# Patient Record
Sex: Female | Born: 1935
Health system: Southern US, Community
[De-identification: ages and names within clinical notes are randomized; demographics above are authoritative.]

## PROBLEM LIST (undated history)

## (undated) DIAGNOSIS — I4892 Unspecified atrial flutter: Secondary | ICD-10-CM

## (undated) DIAGNOSIS — E119 Type 2 diabetes mellitus without complications: Secondary | ICD-10-CM

## (undated) DIAGNOSIS — N182 Chronic kidney disease, stage 2 (mild): Secondary | ICD-10-CM

## (undated) DIAGNOSIS — I10 Essential (primary) hypertension: Secondary | ICD-10-CM

## (undated) HISTORY — PX: EYE SURGERY: SHX253

## (undated) HISTORY — DX: Unspecified atrial flutter: I48.92

## (undated) HISTORY — DX: Essential (primary) hypertension: I10

## (undated) HISTORY — PX: APPENDECTOMY: SHX54

## (undated) HISTORY — DX: Type 2 diabetes mellitus without complications: E11.9

## (undated) HISTORY — PX: CORNEAL TRANSPLANT: SHX108

## (undated) HISTORY — PX: GALLBLADDER SURGERY: SHX652

## (undated) HISTORY — DX: Chronic kidney disease, stage 2 (mild): N18.2

---

## 2012-07-20 ENCOUNTER — Encounter: Payer: Self-pay | Admitting: *Deleted

## 2012-07-30 ENCOUNTER — Ambulatory Visit: Payer: Self-pay | Admitting: Cardiology

## 2012-08-14 ENCOUNTER — Other Ambulatory Visit (HOSPITAL_COMMUNITY): Payer: Self-pay | Admitting: Cardiovascular Disease

## 2012-08-14 DIAGNOSIS — I519 Heart disease, unspecified: Secondary | ICD-10-CM

## 2012-08-14 DIAGNOSIS — I4891 Unspecified atrial fibrillation: Secondary | ICD-10-CM

## 2012-08-14 DIAGNOSIS — R079 Chest pain, unspecified: Secondary | ICD-10-CM

## 2012-08-23 ENCOUNTER — Ambulatory Visit (HOSPITAL_COMMUNITY)
Admission: RE | Admit: 2012-08-23 | Discharge: 2012-08-23 | Disposition: A | Discharge status: Home / Self Care | Payer: Medicare Other | Source: Ambulatory Visit | Attending: Cardiovascular Disease | Admitting: Cardiovascular Disease

## 2012-08-23 DIAGNOSIS — I519 Heart disease, unspecified: Secondary | ICD-10-CM

## 2012-08-23 DIAGNOSIS — R079 Chest pain, unspecified: Secondary | ICD-10-CM

## 2012-08-23 DIAGNOSIS — I10 Essential (primary) hypertension: Secondary | ICD-10-CM | POA: Insufficient documentation

## 2012-08-23 DIAGNOSIS — I4891 Unspecified atrial fibrillation: Secondary | ICD-10-CM

## 2012-08-23 DIAGNOSIS — E119 Type 2 diabetes mellitus without complications: Secondary | ICD-10-CM | POA: Insufficient documentation

## 2012-08-23 DIAGNOSIS — R42 Dizziness and giddiness: Secondary | ICD-10-CM | POA: Insufficient documentation

## 2012-08-23 MED ORDER — REGADENOSON 0.4 MG/5ML IV SOLN
0.4000 mg | Freq: Once | INTRAVENOUS | Status: AC
  Administered 2012-08-23: 0.4 mg via INTRAVENOUS

## 2012-08-23 MED ORDER — TECHNETIUM TC 99M SESTAMIBI GENERIC - CARDIOLITE
10.0000 | Freq: Once | INTRAVENOUS | Status: AC | PRN
  Administered 2012-08-23: 10 via INTRAVENOUS

## 2012-08-23 MED ORDER — TECHNETIUM TC 99M SESTAMIBI GENERIC - CARDIOLITE
30.0000 | Freq: Once | INTRAVENOUS | Status: AC | PRN
  Administered 2012-08-23: 30 via INTRAVENOUS

## 2012-08-23 NOTE — Procedures (Addendum)
[place] [facility] [address] [place] [address] [phone]  Cardiology Nuclear Med Study  Audrey Walker is a 79 y.o. female     MRN : [id number]     DOB: 1933-03-25  Procedure Date: 08/23/2012  Nuclear Med Background Indication for Stress Test:  Evaluation for Ischemia History:  NO PRIOR CARDIAC HISTORY Cardiac Risk Factors: Hypertension, IDDM Type 2 and Obesity  Symptoms:  Chest Pain and Dizziness   Nuclear Pre-Procedure Caffeine/Decaff Intake:  10:00pm NPO After: 8:00am   IV Site: R Forearm  IV 0.9% NS with Angio Cath:  22g  Chest Size (in):  N/A IV Started by: Emmit Pomfret, RN  Height: 5' 6.5" (1.689 m)  Cup Size: B  BMI:  Body mass index is 33.07 kg/(m^2). Weight:  208 lb (94.348 kg)   Tech Comments:  N/A    Nuclear Med Study 1 or 2 day study: 1 day  Stress Test Type:  Lexiscan  Order Authorizing [place]:  Susa Griffins, MD   Resting Radionuclide: Technetium 59m Sestamibi  Resting Radionuclide Dose: 9.9 mCi   Stress Radionuclide:  Technetium 3m Sestamibi  Stress Radionuclide Dose: 30.9 mCi           Stress Protocol Rest HR: 109 Stress HR:116  Rest BP: 146/80 Stress BP: 148/70  Exercise Time (min): n/a METS: n/a          Dose of Adenosine (mg):  n/a Dose of Lexiscan: 0.4 mg  Dose of Atropine (mg): n/a Dose of Dobutamine: n/a mcg/kg/min (at max HR)  Stress Test Technologist: Ernestene Mention, CCT Nuclear Technologist: Koren Shiver, CNMT   Rest Procedure:  Myocardial perfusion imaging was performed at rest 45 minutes following the intravenous administration of Technetium 60m Sestamibi. Stress Procedure:  The patient received IV Lexiscan 0.4 mg over 15-seconds.  Technetium 78m Sestamibi injected at 30-seconds.  There were no significant changes with Lexiscan.  Quantitative spect images were obtained after a 45 minute delay.  Transient Ischemic Dilatation (Normal <1.22):  0.89 Lung/Heart Ratio (Normal  <0.45):  0.33 QGS EDV:  72 ml QGS ESV:  35 ml LV Ejection Fraction: 52%  Rest ECG: Sinus tachycardia with 1st degree AVB  Stress ECG: No significant change from baseline ECG  QPS Raw Data Images:  Normal; no motion artifact; normal heart/lung ratio. Stress Images:  Normal homogeneous uptake in all areas of the myocardium. Rest Images:  Small anteroseptal fixed defect, likely artifact Subtraction (SDS):  No evidence of ischemia.  Impression Exercise Capacity:  Lexiscan with no exercise. BP Response:  Normal blood pressure response. Clinical Symptoms:  No significant symptoms noted. ECG Impression:  No significant ECG changes with Lexiscan. Comparison with Prior Nuclear Study: No images to compare  Overall Impression:  Normal stress nuclear study.  LV Wall Motion:  NL LV Function; NL Wall Motion  Chrystie Nose, MD, [facility] Board Certified in Nuclear Cardiology Attending Cardiologist [facility]  Chrystie Nose, MD  08/23/2012 1:11 PM

## 2012-08-23 NOTE — Progress Notes (Signed)
2D Echo Performed 08/23/2012    Tammie Crouch, RCS  

## 2012-08-24 ENCOUNTER — Ambulatory Visit: Payer: Self-pay | Admitting: Internal Medicine

## 2012-09-13 ENCOUNTER — Ambulatory Visit: Payer: Self-pay | Admitting: Cardiology

## 2012-11-26 ENCOUNTER — Other Ambulatory Visit: Payer: Self-pay | Admitting: Cardiovascular Disease

## 2012-11-26 LAB — CBC WITH DIFFERENTIAL/PLATELET
Basophils Absolute: 0 10*3/uL (ref 0.0–0.1)
Basophils Relative: 0 % (ref 0–1)
Lymphocytes Relative: 18 % (ref 12–46)
MCHC: 33.8 g/dL (ref 30.0–36.0)
Monocytes Absolute: 0.6 10*3/uL (ref 0.1–1.0)
Neutro Abs: 5.7 10*3/uL (ref 1.7–7.7)
Neutrophils Relative %: 74 % (ref 43–77)
Platelets: 180 10*3/uL (ref 150–400)
RDW: 13.3 % (ref 11.5–15.5)
WBC: 7.7 10*3/uL (ref 4.0–10.5)

## 2012-11-26 LAB — COMPREHENSIVE METABOLIC PANEL
ALT: 15 U/L (ref 0–35)
AST: 30 U/L (ref 0–37)
Albumin: 4.2 g/dL (ref 3.5–5.2)
Alkaline Phosphatase: 61 U/L (ref 39–117)
BUN: 30 mg/dL — ABNORMAL HIGH (ref 6–23)
Chloride: 109 mEq/L (ref 96–112)
Potassium: 5 mEq/L (ref 3.5–5.3)
Sodium: 143 mEq/L (ref 135–145)

## 2013-01-15 ENCOUNTER — Other Ambulatory Visit: Payer: Self-pay | Admitting: Cardiovascular Disease

## 2013-01-15 LAB — COMPREHENSIVE METABOLIC PANEL
AST: 24 U/L (ref 0–37)
BUN: 36 mg/dL — ABNORMAL HIGH (ref 6–23)
Calcium: 9.8 mg/dL (ref 8.4–10.5)
Chloride: 110 mEq/L (ref 96–112)
Creat: 1.3 mg/dL — ABNORMAL HIGH (ref 0.50–1.10)
Total Bilirubin: 0.8 mg/dL (ref 0.3–1.2)

## 2013-01-17 ENCOUNTER — Encounter: Payer: Self-pay | Admitting: Cardiovascular Disease

## 2013-02-12 ENCOUNTER — Other Ambulatory Visit: Payer: Self-pay | Admitting: *Deleted

## 2013-02-12 DIAGNOSIS — I4892 Unspecified atrial flutter: Secondary | ICD-10-CM

## 2013-04-29 ENCOUNTER — Encounter: Payer: Self-pay | Admitting: *Deleted

## 2013-04-29 DIAGNOSIS — I4892 Unspecified atrial flutter: Secondary | ICD-10-CM

## 2013-04-29 DIAGNOSIS — I10 Essential (primary) hypertension: Secondary | ICD-10-CM | POA: Insufficient documentation

## 2013-04-29 DIAGNOSIS — E119 Type 2 diabetes mellitus without complications: Secondary | ICD-10-CM

## 2013-04-29 DIAGNOSIS — R030 Elevated blood-pressure reading, without diagnosis of hypertension: Secondary | ICD-10-CM

## 2013-04-30 ENCOUNTER — Ambulatory Visit (INDEPENDENT_AMBULATORY_CARE_PROVIDER_SITE_OTHER): Payer: Medicare Other | Admitting: Cardiology

## 2013-04-30 ENCOUNTER — Encounter: Payer: Self-pay | Admitting: Cardiology

## 2013-04-30 VITALS — BP 182/89 | HR 106 | Ht 66.0 in | Wt 222.0 lb

## 2013-04-30 DIAGNOSIS — I10 Essential (primary) hypertension: Secondary | ICD-10-CM

## 2013-04-30 DIAGNOSIS — E119 Type 2 diabetes mellitus without complications: Secondary | ICD-10-CM

## 2013-04-30 DIAGNOSIS — I4892 Unspecified atrial flutter: Secondary | ICD-10-CM

## 2013-04-30 MED ORDER — ATENOLOL 25 MG PO TABS: 12.5000 mg | ORAL_TABLET | Freq: Every day | ORAL | Status: DC

## 2013-04-30 NOTE — Assessment & Plan Note (Signed)
Keep followup with Dr. Hall. 

## 2013-04-30 NOTE — Patient Instructions (Signed)
  Your physician recommends that you schedule a follow-up appointment in: 2 months  Your physician has recommended you make the following change in your medication: STOP Amiodarone  START Aspirin 81 mg daily  Start Atenolol 25 mg - ( take 1/2 pill daily )

## 2013-04-30 NOTE — Assessment & Plan Note (Signed)
Chronic, not overly symptomatic based on discussion with the patient today. Recent tracings reviewed. Patient previously followed by Dr. Alanda Amass. Prior cardiac testing also reviewed. CHADSVASC score is 5, we discussed her increased risk of stroke, although she tells me that she is not comfortable taking an anticoagulant. She states she will take a baby aspirin daily. I have recommended that she stop amiodarone, and we will try atenolol 12.5 mg daily focusing on heart rate control. She is not interested in cardioversion. Followup arranged.

## 2013-04-30 NOTE — Progress Notes (Signed)
Clinical Summary Audrey Walker is a 79 y.o.female referred to establish cardiology care by Dr. Margo Aye. She is a former patient of Dr. Alanda Amass. Available records were reviewed. She has been managed for atrial flutter at least since January of this year, although duration is not entirely certain. Medical therapy has included diltiazem CD, Toprol-XL, Xarelto, most recently amiodarone. Patient reports multiple medication intolerances. Possibility of cardioversion seems to have been discussed, although was never pursued since she would not take an anticoagulant.  Recent lab work showed hemoglobin 13.4, platelets 173, potassium 4.6, BUN 30, creatinine 1.2 (creatinine clearance 40-50), AST 25, ALT 19, hemoglobin A1c 6.5%, cholesterol 171, triglycerides 65, HDL 71, LDL 87, TSH 0.8. Recent tracings from August and September revealed probable 2:1 atrial flutter versus atrial tachycardia with 2:1 block. This is noted on old tracings as well.  Echocardiogram from March of this year revealed mild LVH with LVEF 50-55%, grade 1 diastolic dysfunction, aortic valve sclerosis, mild mitral regurgitation, mild biatrial enlargement. Lexican Myoview from March of this year was negative for ischemia [facility]).  Audrey Walker states she feels better since she stopped Cardizem CD, Xarelto and Toprol-XL. We reviewed her thromboembolic risk, CHADSVASC score is  5. We discussed different options for anticoagulation. Patient was still not comfortable with an anticoagulant, did agree to take an aspirin daily. We also discussed other options for rate control rather than amiodarone to reduce potential long-term side effects. She stated that she did not want to consider a cardioversion.  Patient lives alone, states that her husband passed away last year, as did her son earlier this year. Has had a prolonged grieving process.  No Known Allergies  Current Outpatient Prescriptions  Medication Sig Dispense Refill  . acetaminophen  (TYLENOL) 500 MG tablet Take 500 mg by mouth every 6 (six) hours as needed.      . Ascorbic Acid (VITAMIN C) 1000 MG tablet Take 2,000 mg by mouth daily.      [name] aspirin EC 81 MG tablet Take 81 mg by mouth daily.      . Cyanocobalamin (VITAMIN B 12 PO) Take by mouth.      . enalapril (VASOTEC) 20 MG tablet Take 20 mg by mouth daily.      . fish oil-omega-3 fatty acids 1000 MG capsule Take 1 g by mouth daily.      . insulin glargine (LANTUS) 100 UNIT/ML injection Inject 40 Units into the skin at bedtime.      [name] atenolol (TENORMIN) 25 MG tablet Take 0.5 tablets (12.5 mg total) by mouth daily.  180 tablet  3   No current facility-administered medications for this visit.    Past Medical History  Diagnosis Date  . Type 2 diabetes mellitus   . Essential hypertension, benign   . Atrial flutter   . CKD (chronic kidney disease) stage 2, GFR 60-89 ml/min     History reviewed. No pertinent past surgical history.  Family History  Problem Relation Age of Onset  . Hypertension    . Diabetes Mellitus II      Social History Audrey Walker reports that she has never smoked. She does not have any smokeless tobacco history on file. Audrey Walker reports that she does not drink alcohol.  Review of Systems Does not complain of any palpitations. No chest pain or syncope. Appetite is fair. Not exercising regularly. Some trouble sleeping. No reported bleeding diathesis. No speech or focal motor deficits. Otherwise negative.  Physical Examination Filed Vitals:   04/30/13  0914  BP: 182/89  Pulse: 106   Filed Weights   04/30/13 0914  Weight: 222 lb (100.699 kg)   Overweight woman, comfortable at rest. HEENT: Conjunctiva and lids normal, oropharynx clear. Neck: Supple, no elevated JVP or carotid bruits, no thyromegaly. Lungs: Clear to auscultation, nonlabored breathing at rest. Cardiac: Regular rate and rhythm, no S3, soft systolic murmur, no pericardial rub. Abdomen: Soft, nontender, bowel sounds  present. Extremities: No pitting edema, distal pulses 2+. Skin: Warm and dry. Musculoskeletal: No kyphosis. Neuropsychiatric: Alert and oriented x3, affect grossly appropriate.   Problem List and Plan   Atrial flutter Chronic, not overly symptomatic based on discussion with the patient today. Recent tracings reviewed. Patient previously followed by Dr. Alanda Amass. Prior cardiac testing also reviewed. CHADSVASC score is 5, we discussed her increased risk of stroke, although she tells me that she is not comfortable taking an anticoagulant. She states she will take a baby aspirin daily. I have recommended that she stop amiodarone, and we will try atenolol 12.5 mg daily focusing on heart rate control. She is not interested in cardioversion. Followup arranged.  Essential hypertension, benign Blood pressure elevated today. Keep followup with Dr. Margo Aye. Continue on ACE inhibitor, low-dose beta blocker being added as well.  Type 2 diabetes mellitus Keep followup with Dr. Margo Aye.    Jonelle Sidle, M.D., F.A.C.C.

## 2013-04-30 NOTE — Assessment & Plan Note (Signed)
Blood pressure elevated today. Keep followup with Dr. Margo Aye. Continue on ACE inhibitor, low-dose beta blocker being added as well.

## 2013-06-19 ENCOUNTER — Ambulatory Visit: Payer: Medicare Other | Admitting: Cardiology

## 2013-06-25 ENCOUNTER — Encounter: Payer: Self-pay | Admitting: Cardiology

## 2013-06-25 ENCOUNTER — Ambulatory Visit (INDEPENDENT_AMBULATORY_CARE_PROVIDER_SITE_OTHER): Payer: Medicare Other | Admitting: Cardiology

## 2013-06-25 VITALS — BP 145/97 | HR 96 | Ht 66.5 in | Wt 225.0 lb

## 2013-06-25 DIAGNOSIS — I4892 Unspecified atrial flutter: Secondary | ICD-10-CM

## 2013-06-25 DIAGNOSIS — I10 Essential (primary) hypertension: Secondary | ICD-10-CM

## 2013-06-25 MED ORDER — ATENOLOL 25 MG PO TABS: 25.0000 mg | ORAL_TABLET | Freq: Every day | ORAL | Status: DC

## 2013-06-25 NOTE — Assessment & Plan Note (Signed)
As per discussions outlined, she plans to continue on aspirin rather than anticoagulant therapy, and we will use atenolol for rate control with no plans to cardiovert. Increase atenolol to 25 mg daily. Followup arranged.

## 2013-06-25 NOTE — Patient Instructions (Addendum)
Your physician recommends that you schedule a follow-up appointment in: 6 months   Increase Atenolol to 25 mg daily (take whole tablet)

## 2013-06-25 NOTE — Progress Notes (Signed)
    Clinical Summary Audrey Walker is a 80 y.o.female last seen in November 2014. Medication adjustments were made at that time. She states that she has tolerated atenolol, having some difficulty cutting the tablets.  Echocardiogram from March of this year revealed mild LVH with LVEF 67-67%, grade 1 diastolic dysfunction, aortic valve sclerosis, mild mitral regurgitation, mild biatrial enlargement. Lexican Myoview from March of this year was negative for ischemia [facility]).  We have discussed her increased thromboembolic risk, CHADSVASC score of 5, she continues to decline anticoagulation. She continues on aspirin. She is not interested in considering a cardioversion as we discussed previously.  She does report cold and flulike symptoms that began around Christmas time, has been slow to improve but is gradually feeling better. She never sought medical attention, has not seen Dr. [place] Crane.  No Known Allergies  Current Outpatient Prescriptions  Medication Sig Dispense Refill  . acetaminophen (TYLENOL) 500 MG tablet Take 500 mg by mouth every 6 (six) hours as needed.      . Ascorbic Acid (VITAMIN C) 1000 MG tablet Take 2,000 mg by mouth daily.      [name] aspirin EC 81 MG tablet Take 81 mg by mouth daily.      [name] atenolol (TENORMIN) 25 MG tablet Take 1 tablet (25 mg total) by mouth daily.  180 tablet  3  . Cyanocobalamin (VITAMIN B 12 PO) Take by mouth.      . enalapril (VASOTEC) 20 MG tablet Take 20 mg by mouth daily.      . fish oil-omega-3 fatty acids 1000 MG capsule Take 1 g by mouth daily.      . insulin glargine (LANTUS) 100 UNIT/ML injection Inject 40 Units into the skin at bedtime.       No current facility-administered medications for this visit.    Past Medical History  Diagnosis Date  . Type 2 diabetes mellitus   . Essential hypertension, benign   . Atrial flutter   . CKD (chronic kidney disease) stage 2, GFR 60-89 ml/min     Social History Audrey Walker reports that she has never smoked.  She does not have any smokeless tobacco history on file. Audrey Walker reports that she does not drink alcohol.  Review of Systems No exertional chest pain. Cough, nonproductive. No recent fevers or chills. Otherwise as outlined.  Physical Examination Filed Vitals:   06/25/13 1122  BP: 145/97  Pulse: 96   Filed Weights   06/25/13 1122  Weight: 225 lb (102.059 kg)    Overweight woman, comfortable at rest. Wearing a mask. HEENT: Conjunctiva and lids normal, oropharynx clear.  Neck: Supple, no elevated JVP or carotid bruits, no thyromegaly.  Lungs: Clear to auscultation, nonlabored breathing at rest.  Cardiac: Regular rate and rhythm, no S3, soft systolic murmur, no pericardial rub.  Abdomen: Soft, nontender, bowel sounds present.  Extremities: No pitting edema, distal pulses 2+.    Problem List and Plan   Atrial flutter As per discussions outlined, she plans to continue on aspirin rather than anticoagulant therapy, and we will use atenolol for rate control with no plans to cardiovert. Increase atenolol to 25 mg daily. Followup arranged.  Essential hypertension, benign Blood pressure elevated today. I recommended that she arrange a followup visit with Dr. [place] Crane - she states she has not seen him recently.    Satira Sark, M.D., F.A.C.C.

## 2013-06-25 NOTE — Assessment & Plan Note (Signed)
Blood pressure elevated today. I recommended that she arrange a followup visit with Dr. Nevada Crane - she states she has not seen him recently.

## 2013-08-19 ENCOUNTER — Encounter: Payer: Self-pay | Admitting: Cardiology

## 2013-11-14 ENCOUNTER — Ambulatory Visit (HOSPITAL_COMMUNITY)
Admission: RE | Admit: 2013-11-14 | Discharge: 2013-11-14 | Disposition: A | Discharge status: Home / Self Care | Payer: Medicare Other | Source: Ambulatory Visit | Attending: Internal Medicine | Admitting: Internal Medicine

## 2013-11-14 ENCOUNTER — Other Ambulatory Visit (HOSPITAL_COMMUNITY): Payer: Self-pay | Admitting: Internal Medicine

## 2013-11-14 DIAGNOSIS — M25562 Pain in left knee: Secondary | ICD-10-CM

## 2013-11-14 DIAGNOSIS — M25469 Effusion, unspecified knee: Secondary | ICD-10-CM | POA: Insufficient documentation

## 2013-11-14 DIAGNOSIS — M25569 Pain in unspecified knee: Secondary | ICD-10-CM | POA: Insufficient documentation

## 2014-02-05 ENCOUNTER — Ambulatory Visit (INDEPENDENT_AMBULATORY_CARE_PROVIDER_SITE_OTHER): Payer: Medicare Other | Admitting: Cardiology

## 2014-02-05 ENCOUNTER — Encounter: Payer: Self-pay | Admitting: Cardiology

## 2014-02-05 VITALS — BP 122/84 | HR 110 | Ht 66.0 in | Wt 227.0 lb

## 2014-02-05 DIAGNOSIS — I4892 Unspecified atrial flutter: Secondary | ICD-10-CM

## 2014-02-05 DIAGNOSIS — I10 Essential (primary) hypertension: Secondary | ICD-10-CM

## 2014-02-05 MED ORDER — ATENOLOL 25 MG PO TABS: 25.0000 mg | ORAL_TABLET | Freq: Every day | ORAL | Status: DC

## 2014-02-05 NOTE — Assessment & Plan Note (Signed)
Blood pressure today looks good.

## 2014-02-05 NOTE — Assessment & Plan Note (Signed)
Patient prefers a very conservative approach overall. She is on aspirin and low-dose atenolol. She declines anticoagulation and attempt at cardioversion or further EP evaluation. Keep followup with Dr. Nevada Crane, consider increasing atenolol to slow heart rate further if blood pressure tolerates, she did not want to make any changes today.

## 2014-02-05 NOTE — Progress Notes (Signed)
    Clinical Summary Audrey Walker is a 80 y.o.female last seen in January. She denies any significant sense of palpitations or progressive shortness of breath, remains functional in her ADLs. She did have a fall outside, lost her balance while she was cutting a rosebush, no dizziness or syncope.  Lab work from March of this year showed potassium 4.5, BUN 23, creatinine 1.0, normal LFTs, cholesterol 176, triglycerides 100, HDL 61, LDL 95.  ECG today shows atrial flutter with 2:1 block. We have discussed her increased thromboembolic risk, CHADSVASC score of 5, she continues to decline anticoagulation. She continues on aspirin. She is not interested in considering a cardioversion as we discussed previously.   No Known Allergies  Current Outpatient Prescriptions  Medication Sig Dispense Refill  . acetaminophen (TYLENOL) 500 MG tablet Take 500 mg by mouth every 6 (six) hours as needed.      . Ascorbic Acid (VITAMIN C) 1000 MG tablet Take 2,000 mg by mouth daily.      [name] aspirin EC 81 MG tablet Take 81 mg by mouth daily.      [name] atenolol (TENORMIN) 25 MG tablet Take 1 tablet (25 mg total) by mouth daily.  90 tablet  3  . Cyanocobalamin (VITAMIN B 12 PO) Take by mouth.      . enalapril (VASOTEC) 20 MG tablet Take 20 mg by mouth daily.      . fish oil-omega-3 fatty acids 1000 MG capsule Take 1 g by mouth daily.      . insulin glargine (LANTUS) 100 UNIT/ML injection Inject 40 Units into the skin at bedtime.       No current facility-administered medications for this visit.    Past Medical History  Diagnosis Date  . Type 2 diabetes mellitus   . Essential hypertension, benign   . Atrial flutter   . CKD (chronic kidney disease) stage 2, GFR 60-89 ml/min      Social History Audrey Walker reports that she has never smoked. She does not have any smokeless tobacco history on file. Audrey Walker reports that she does not drink alcohol.  Review of Systems Other systems reviewed and negative except as  outlined.  Physical Examination Filed Vitals:   02/05/14 1556  BP: 122/84  Pulse: 110   Filed Weights   02/05/14 1556  Weight: 227 lb (102.967 kg)    Overweight woman, comfortable at rest. HEENT: Conjunctiva and lids normal, oropharynx clear.  Neck: Supple, no elevated JVP or carotid bruits, no thyromegaly.  Lungs: Clear to auscultation, nonlabored breathing at rest.  Cardiac: Regular rate and rhythm, no S3, soft systolic murmur, no pericardial rub.  Abdomen: Soft, nontender, bowel sounds present.  Extremities: No pitting edema, distal pulses 2+.    Problem List and Plan   Atrial flutter Patient prefers a very conservative approach overall. She is on aspirin and low-dose atenolol. She declines anticoagulation and attempt at cardioversion or further EP evaluation. Keep followup with Dr. Nevada Crane, consider increasing atenolol to slow heart rate further if blood pressure tolerates, she did not want to make any changes today.  Essential hypertension, benign Blood pressure today looks good.    Satira Sark, M.D., F.A.C.C.

## 2014-02-05 NOTE — Patient Instructions (Signed)
Your physician wants you to follow-up in: 6 months with Dr. Domenic Polite. You will receive a reminder letter in the mail two months in advance. If you don't receive a letter, please call our office to schedule the follow-up appointment.  Your physician recommends that you continue on your current medications as directed. Please refer to the Current Medication list given to you today.  I have refilled your atenolol  Thank you for choosing [facility]!!

## 2014-08-11 ENCOUNTER — Encounter: Payer: Self-pay | Admitting: Cardiology

## 2014-08-11 ENCOUNTER — Ambulatory Visit (INDEPENDENT_AMBULATORY_CARE_PROVIDER_SITE_OTHER): Payer: Medicare HMO | Admitting: Cardiology

## 2014-08-11 VITALS — BP 126/62 | HR 61 | Ht 66.0 in | Wt 222.0 lb

## 2014-08-11 DIAGNOSIS — I483 Typical atrial flutter: Secondary | ICD-10-CM

## 2014-08-11 DIAGNOSIS — I10 Essential (primary) hypertension: Secondary | ICD-10-CM

## 2014-08-11 MED ORDER — ATENOLOL 50 MG PO TABS: 50.0000 mg | ORAL_TABLET | Freq: Every day | ORAL | Status: DC

## 2014-08-11 NOTE — Patient Instructions (Signed)
Your physician wants you to follow-up in: 6 months with Dr.McDowell You will receive a reminder letter in the mail two months in advance. If you don't receive a letter, please call our office to schedule the follow-up appointment.     Your physician recommends that you continue on your current medications as directed. Please refer to the Current Medication list given to you today.     Thank you for choosing [facility] !        

## 2014-08-11 NOTE — Progress Notes (Signed)
Cardiology Office Note  Date: 08/11/2014   ID: Audrey Walker, DOB 1932/07/28, MRN [id number]  PCP: Delphina Cahill, MD  Primary Cardiologist: Rozann Lesches, MD   Chief Complaint  Patient presents with  . Atrial Flutter  . Hypertension    History of Present Illness: Audrey Walker is a 81 y.o. female last seen in August 2015. She presents for a routine follow-up visit. She has had no progression in sense of palpitations, occasionally feels these at night time. No reported chest pain or syncope. She remains on aspirin and atenolol.  We have discussed her increased thromboembolic risk, CHADSVASC score of 5, and she continues to decline anticoagulation.  Primary care continues with Dr. Nevada Crane.  Past Medical History  Diagnosis Date  . Type 2 diabetes mellitus   . Essential hypertension, benign   . Atrial flutter   . CKD (chronic kidney disease) stage 2, GFR 60-89 ml/min     Current Outpatient Prescriptions  Medication Sig Dispense Refill  . acetaminophen (TYLENOL) 500 MG tablet Take 500 mg by mouth every 6 (six) hours as needed.    . Ascorbic Acid (VITAMIN C) 1000 MG tablet Take 2,000 mg by mouth daily.    [name] aspirin EC 81 MG tablet Take 81 mg by mouth daily.    [name] atenolol (TENORMIN) 50 MG tablet Take 1 tablet (50 mg total) by mouth daily. 90 tablet 3  . Cyanocobalamin (VITAMIN B 12 PO) Take by mouth.    . enalapril (VASOTEC) 20 MG tablet Take 20 mg by mouth daily.    . fish oil-omega-3 fatty acids 1000 MG capsule Take 1 g by mouth daily.    . insulin glargine (LANTUS) 100 UNIT/ML injection Inject 26 Units into the skin at bedtime.      No current facility-administered medications for this visit.    Allergies:  Nsaids   Social History: The patient  reports that she has never smoked. She does not have any smokeless tobacco history on file. She reports that she does not drink alcohol or use illicit drugs.   ROS:  Please see the history of present illness. Otherwise, complete  review of systems is positive for arthritic pains.  All other systems are reviewed and negative.    Physical Exam: VS:  BP 126/62 mmHg  Pulse 61  Ht 5\' 6"  (1.676 m)  Wt 222 lb (100.699 kg)  BMI 35.85 kg/m2  SpO2 95%, BMI Body mass index is 35.85 kg/(m^2).  Wt Readings from Last 3 Encounters:  08/11/14 222 lb (100.699 kg)  02/05/14 227 lb (102.967 kg)  06/25/13 225 lb (102.059 kg)     Overweight woman, comfortable at rest. HEENT: Conjunctiva and lids normal, oropharynx clear.  Neck: Supple, no elevated JVP or carotid bruits, no thyromegaly.  Lungs: Clear to auscultation, nonlabored breathing at rest.  Cardiac: Regular rate and rhythm, no S3, soft systolic murmur, no pericardial rub.  Abdomen: Soft, nontender, bowel sounds present.  Extremities: No pitting edema, distal pulses 2+.    ECG: ECG is not ordered today.  Recent Labwork:  Lab work from March 2015 showed potassium 4.5, BUN 23, creatinine 1.0, AST 27, ALT 22, cholesterol 176, triglycerides 100, HDL 61, LDL 95, hemoglobin A1c 7.0.  Other Studies Reviewed Today:  Echocardiogram 08/23/2012: Study Conclusions  - Left ventricle: The cavity size was normal. There was mild concentric hypertrophy. Systolic function was normal. The estimated ejection fraction was in the range of 50% to 55%. Wall motion was normal; there were no regional  wall motion abnormalities. Doppler parameters are consistent with abnormal left ventricular relaxation (grade 1 diastolic dysfunction). - Aortic valve: Sclerosis without stenosis. - Mitral valve: Mild regurgitation. - Left atrium: The atrium was mildly dilated. - Right atrium: The atrium was mildly dilated. - Atrial septum: No defect or patent foramen ovale was identified. Impressions:  - Relative taqchycardia. Mild reduction in EF. Doppler interrogation shows aatrial flutter pattern. No significant valvular disease.  ASSESSMENT AND PLAN:  1. Persistent  atrial flutter, mildly symptomatic. She continues on aspirin and atenolol, has declined anticoagulation and attempts at cardioversion. Continue observation.  2. Essential hypertension, blood pressure normal today.  Current medicines are reviewed at length with the patient today.  The patient does not have concerns regarding medicines.  Disposition: FU with me in 6 months.   Signed, Satira Sark, MD, [facility] 08/11/2014 10:13 AM    [place] [facility] [address], [place], [place] [address] Phone: [phone]; Fax: [phone]

## 2015-01-01 ENCOUNTER — Encounter: Payer: Self-pay | Admitting: *Deleted

## 2015-04-13 DIAGNOSIS — Z961 Presence of intraocular lens: Secondary | ICD-10-CM | POA: Diagnosis not present

## 2015-04-13 DIAGNOSIS — Z9841 Cataract extraction status, right eye: Secondary | ICD-10-CM | POA: Diagnosis not present

## 2015-04-13 DIAGNOSIS — E11319 Type 2 diabetes mellitus with unspecified diabetic retinopathy without macular edema: Secondary | ICD-10-CM | POA: Diagnosis not present

## 2015-04-13 DIAGNOSIS — H1851 Endothelial corneal dystrophy: Secondary | ICD-10-CM | POA: Diagnosis not present

## 2015-04-30 DIAGNOSIS — I10 Essential (primary) hypertension: Secondary | ICD-10-CM | POA: Diagnosis not present

## 2015-04-30 DIAGNOSIS — N182 Chronic kidney disease, stage 2 (mild): Secondary | ICD-10-CM | POA: Diagnosis not present

## 2015-04-30 DIAGNOSIS — E1122 Type 2 diabetes mellitus with diabetic chronic kidney disease: Secondary | ICD-10-CM | POA: Diagnosis not present

## 2015-05-05 DIAGNOSIS — N182 Chronic kidney disease, stage 2 (mild): Secondary | ICD-10-CM | POA: Diagnosis not present

## 2015-05-05 DIAGNOSIS — E1122 Type 2 diabetes mellitus with diabetic chronic kidney disease: Secondary | ICD-10-CM | POA: Diagnosis not present

## 2015-05-05 DIAGNOSIS — I482 Chronic atrial fibrillation: Secondary | ICD-10-CM | POA: Diagnosis not present

## 2015-05-05 DIAGNOSIS — M179 Osteoarthritis of knee, unspecified: Secondary | ICD-10-CM | POA: Diagnosis not present

## 2015-05-05 DIAGNOSIS — I10 Essential (primary) hypertension: Secondary | ICD-10-CM | POA: Diagnosis not present

## 2015-08-31 DIAGNOSIS — E1122 Type 2 diabetes mellitus with diabetic chronic kidney disease: Secondary | ICD-10-CM | POA: Diagnosis not present

## 2015-08-31 DIAGNOSIS — I10 Essential (primary) hypertension: Secondary | ICD-10-CM | POA: Diagnosis not present

## 2015-09-02 DIAGNOSIS — N182 Chronic kidney disease, stage 2 (mild): Secondary | ICD-10-CM | POA: Diagnosis not present

## 2015-09-02 DIAGNOSIS — K589 Irritable bowel syndrome without diarrhea: Secondary | ICD-10-CM | POA: Diagnosis not present

## 2015-09-02 DIAGNOSIS — E1122 Type 2 diabetes mellitus with diabetic chronic kidney disease: Secondary | ICD-10-CM | POA: Diagnosis not present

## 2015-09-02 DIAGNOSIS — I10 Essential (primary) hypertension: Secondary | ICD-10-CM | POA: Diagnosis not present

## 2015-09-02 DIAGNOSIS — I482 Chronic atrial fibrillation: Secondary | ICD-10-CM | POA: Diagnosis not present

## 2015-09-02 DIAGNOSIS — M179 Osteoarthritis of knee, unspecified: Secondary | ICD-10-CM | POA: Diagnosis not present

## 2015-09-09 ENCOUNTER — Ambulatory Visit (INDEPENDENT_AMBULATORY_CARE_PROVIDER_SITE_OTHER): Payer: Medicare HMO | Admitting: Cardiology

## 2015-09-09 ENCOUNTER — Encounter: Payer: Self-pay | Admitting: Cardiology

## 2015-09-09 VITALS — BP 122/68 | HR 70 | Ht 66.0 in | Wt 197.0 lb

## 2015-09-09 DIAGNOSIS — I4892 Unspecified atrial flutter: Secondary | ICD-10-CM

## 2015-09-09 DIAGNOSIS — I10 Essential (primary) hypertension: Secondary | ICD-10-CM | POA: Diagnosis not present

## 2015-09-09 NOTE — Progress Notes (Signed)
Cardiology Office Note  Date: 09/09/2015   ID: Audrey Walker, DOB Nov 02, 1932, MRN [id number]  PCP: Wende Neighbors, MD  Primary Cardiologist: Rozann Lesches, MD   Chief Complaint  Patient presents with  . Atrial Flutter    History of Present Illness: Audrey Walker is an 82 y.o. female last seen in February 2016. She presents for a routine follow-up visit. No complains of palpitations, unusual shortness of breath, or chest pain. She continues to follow with Dr. Nevada Crane for primary care.  CHADSVASC score is 5 - she has consistently declined anticoagulation. She does take aspirin, and also remains on atenolol. Her ECG today actually shows sinus rhythm with PACs.  Past Medical History  Diagnosis Date  . Type 2 diabetes mellitus ([place])   . Essential hypertension, benign   . Atrial flutter ([place])   . CKD (chronic kidney disease) stage 2, GFR 60-89 ml/min     Current Outpatient Prescriptions  Medication Sig Dispense Refill  . acetaminophen (TYLENOL) 500 MG tablet Take 500 mg by mouth every 6 (six) hours as needed.    . Ascorbic Acid (VITAMIN C) 1000 MG tablet Take 2,000 mg by mouth daily.    [name] aspirin EC 81 MG tablet Take 81 mg by mouth daily.    [name] atenolol (TENORMIN) 50 MG tablet Take 1 tablet (50 mg total) by mouth daily. 90 tablet 3  . Cyanocobalamin (VITAMIN B 12 PO) Take by mouth.    . enalapril (VASOTEC) 20 MG tablet Take 20 mg by mouth daily.    . fish oil-omega-3 fatty acids 1000 MG capsule Take 1 g by mouth daily.    . insulin glargine (LANTUS) 100 UNIT/ML injection Inject 26 Units into the skin at bedtime.      No current facility-administered medications for this visit.   Allergies:  Nsaids   Social History: The patient  reports that she has never smoked. She does not have any smokeless tobacco history on file. She reports that she does not drink alcohol or use illicit drugs.   ROS:  Please see the history of present illness. Otherwise, complete review of systems is positive  for arthritic symptoms.  All other systems are reviewed and negative.   Physical Exam: VS:  BP 122/68 mmHg  Pulse 70  Ht 5\' 6"  (1.676 m)  Wt 197 lb (89.359 kg)  BMI 31.81 kg/m2  SpO2 98%, BMI Body mass index is 31.81 kg/(m^2).  Wt Readings from Last 3 Encounters:  09/09/15 197 lb (89.359 kg)  08/11/14 222 lb (100.699 kg)  02/05/14 227 lb (102.967 kg)    General: Patient appears comfortable at rest. HEENT: Conjunctiva and lids normal, oropharynx clear with moist mucosa. Neck: Supple, no elevated JVP or carotid bruits, no thyromegaly. Lungs: Clear to auscultation, nonlabored breathing at rest. Cardiac: Regular rate and rhythm with ectopy, no S3 or significant systolic murmur, no pericardial rub. Abdomen: Soft, nontender, no hepatomegaly, bowel sounds present, no guarding or rebound. Extremities: No pitting edema, distal pulses 2+.  ECG: I personally reviewed the prior tracing from 02/05/2014 which showed atrial flutter with 2:1 block, low voltage and decreased R wave progression..  Recent Labwork:  June 2016: Potassium 4.6, BUN 29, creatinine 1.0, AST 28, ALT 23, hemoglobin 12.9, platelets 158, cholesterol 155, triglycerides 72, HDL 58, LDL 83, hemoglobin A1c 6.6  Other Studies Reviewed Today:  Echocardiogram 08/23/2012: Study Conclusions  - Left ventricle: The cavity size was normal. There was mild concentric hypertrophy. Systolic function was normal. The estimated ejection  fraction was in the range of 50% to 55%. Wall motion was normal; there were no regional wall motion abnormalities. Doppler parameters are consistent with abnormal left ventricular relaxation (grade 1 diastolic dysfunction). - Aortic valve: Sclerosis without stenosis. - Mitral valve: Mild regurgitation. - Left atrium: The atrium was mildly dilated. - Right atrium: The atrium was mildly dilated. - Atrial septum: No defect or patent foramen ovale was identified.  Lexiscan Myoview  08/23/2012: Impression Exercise Capacity: Lexiscan with no exercise. BP Response: Normal blood pressure response. Clinical Symptoms: No significant symptoms noted. ECG Impression: No significant ECG changes with Lexiscan. Comparison with Prior Nuclear Study: No images to compare  Overall Impression: Normal stress nuclear study.  LV Wall Motion: NL LV Function; NL Wall Motion  Assessment and Plan:  1. Paroxysmal atrial flutter, in sinus rhythm today with PACs by ECG. She is not bothered by any significant palpitations on atenolol. She has preferred to remain on aspirin rather than switching to DOAC, although CHADSVASC score is 5.  2. Essential hypertension, blood pressure control is good today. She is also on Vasotec.  Current medicines were reviewed with the patient today.   Orders Placed This Encounter  Procedures  . EKG 12-Lead    Disposition: FU with me in 1 year.   Signed, Satira Sark, MD, [facility] 09/09/2015 9:26 AM    [facility] [place]. [address] [place], [place] [address] Phone: [phone]; Fax: [phone]

## 2015-09-09 NOTE — Patient Instructions (Signed)

## 2015-09-24 DIAGNOSIS — H2510 Age-related nuclear cataract, unspecified eye: Secondary | ICD-10-CM | POA: Diagnosis not present

## 2015-09-24 DIAGNOSIS — E109 Type 1 diabetes mellitus without complications: Secondary | ICD-10-CM | POA: Diagnosis not present

## 2015-09-24 DIAGNOSIS — Z0100 Encounter for examination of eyes and vision without abnormal findings: Secondary | ICD-10-CM | POA: Diagnosis not present

## 2015-09-24 DIAGNOSIS — H5200 Hypermetropia, unspecified eye: Secondary | ICD-10-CM | POA: Diagnosis not present

## 2015-10-13 DIAGNOSIS — E1122 Type 2 diabetes mellitus with diabetic chronic kidney disease: Secondary | ICD-10-CM | POA: Diagnosis not present

## 2016-02-04 DIAGNOSIS — E782 Mixed hyperlipidemia: Secondary | ICD-10-CM | POA: Diagnosis not present

## 2016-02-04 DIAGNOSIS — E1122 Type 2 diabetes mellitus with diabetic chronic kidney disease: Secondary | ICD-10-CM | POA: Diagnosis not present

## 2016-02-09 DIAGNOSIS — E1122 Type 2 diabetes mellitus with diabetic chronic kidney disease: Secondary | ICD-10-CM | POA: Diagnosis not present

## 2016-02-09 DIAGNOSIS — N182 Chronic kidney disease, stage 2 (mild): Secondary | ICD-10-CM | POA: Diagnosis not present

## 2016-02-09 DIAGNOSIS — I10 Essential (primary) hypertension: Secondary | ICD-10-CM | POA: Diagnosis not present

## 2016-02-09 DIAGNOSIS — I482 Chronic atrial fibrillation: Secondary | ICD-10-CM | POA: Diagnosis not present

## 2016-02-09 DIAGNOSIS — M179 Osteoarthritis of knee, unspecified: Secondary | ICD-10-CM | POA: Diagnosis not present

## 2016-03-02 DIAGNOSIS — E1122 Type 2 diabetes mellitus with diabetic chronic kidney disease: Secondary | ICD-10-CM | POA: Diagnosis not present

## 2016-03-11 DIAGNOSIS — E1122 Type 2 diabetes mellitus with diabetic chronic kidney disease: Secondary | ICD-10-CM | POA: Diagnosis not present

## 2016-03-11 DIAGNOSIS — Z0000 Encounter for general adult medical examination without abnormal findings: Secondary | ICD-10-CM | POA: Diagnosis not present

## 2016-03-11 DIAGNOSIS — Z794 Long term (current) use of insulin: Secondary | ICD-10-CM | POA: Diagnosis not present

## 2016-03-11 DIAGNOSIS — I10 Essential (primary) hypertension: Secondary | ICD-10-CM | POA: Diagnosis not present

## 2016-03-11 DIAGNOSIS — I4892 Unspecified atrial flutter: Secondary | ICD-10-CM | POA: Diagnosis not present

## 2016-04-11 DIAGNOSIS — H1851 Endothelial corneal dystrophy: Secondary | ICD-10-CM | POA: Diagnosis not present

## 2016-06-21 DIAGNOSIS — E119 Type 2 diabetes mellitus without complications: Secondary | ICD-10-CM | POA: Diagnosis not present

## 2016-06-21 DIAGNOSIS — H1851 Endothelial corneal dystrophy: Secondary | ICD-10-CM | POA: Diagnosis not present

## 2016-06-21 DIAGNOSIS — Z794 Long term (current) use of insulin: Secondary | ICD-10-CM | POA: Diagnosis not present

## 2016-07-01 DIAGNOSIS — H18231 Secondary corneal edema, right eye: Secondary | ICD-10-CM | POA: Diagnosis not present

## 2016-07-27 DIAGNOSIS — D0512 Intraductal carcinoma in situ of left breast: Secondary | ICD-10-CM | POA: Diagnosis not present

## 2016-07-27 DIAGNOSIS — R69 Illness, unspecified: Secondary | ICD-10-CM | POA: Diagnosis not present

## 2016-07-27 DIAGNOSIS — Z006 Encounter for examination for normal comparison and control in clinical research program: Secondary | ICD-10-CM | POA: Diagnosis not present

## 2016-07-28 DIAGNOSIS — E1122 Type 2 diabetes mellitus with diabetic chronic kidney disease: Secondary | ICD-10-CM | POA: Diagnosis not present

## 2016-09-01 DIAGNOSIS — E1122 Type 2 diabetes mellitus with diabetic chronic kidney disease: Secondary | ICD-10-CM | POA: Diagnosis not present

## 2016-09-01 DIAGNOSIS — I10 Essential (primary) hypertension: Secondary | ICD-10-CM | POA: Diagnosis not present

## 2016-09-05 DIAGNOSIS — N182 Chronic kidney disease, stage 2 (mild): Secondary | ICD-10-CM | POA: Diagnosis not present

## 2016-09-05 DIAGNOSIS — Z6831 Body mass index (BMI) 31.0-31.9, adult: Secondary | ICD-10-CM | POA: Diagnosis not present

## 2016-09-05 DIAGNOSIS — I482 Chronic atrial fibrillation: Secondary | ICD-10-CM | POA: Diagnosis not present

## 2016-09-05 DIAGNOSIS — R69 Illness, unspecified: Secondary | ICD-10-CM | POA: Diagnosis not present

## 2016-09-05 DIAGNOSIS — I10 Essential (primary) hypertension: Secondary | ICD-10-CM | POA: Diagnosis not present

## 2016-09-05 DIAGNOSIS — M25569 Pain in unspecified knee: Secondary | ICD-10-CM | POA: Diagnosis not present

## 2016-09-05 DIAGNOSIS — E1122 Type 2 diabetes mellitus with diabetic chronic kidney disease: Secondary | ICD-10-CM | POA: Diagnosis not present

## 2016-09-05 DIAGNOSIS — K589 Irritable bowel syndrome without diarrhea: Secondary | ICD-10-CM | POA: Diagnosis not present

## 2016-09-06 NOTE — Progress Notes (Signed)
Cardiology Office Note  Date: 09/08/2016   ID: Audrey Walker, DOB 27-Nov-1932, MRN [id number]  PCP: Wende Neighbors, MD  Primary Cardiologist: Rozann Lesches, MD   Chief Complaint  Patient presents with  . History of atrial flutter    History of Present Illness: Audrey Walker is an 83 y.o. female last seen in March 2017. She presents for a routine follow-up visit. Reports no significant palpitations since last encounter. She continues to follow regularly with Dr. Nevada Crane.  CHADSVASC score is 5 - she has consistently declined anticoagulation. Current cardiac regimen includes aspirin, Vasotec, and Tenormin.  I personally reviewed her ECG today which shows normal sinus rhythm with low voltage.  Past Medical History:  Diagnosis Date  . Atrial flutter ([place])   . CKD (chronic kidney disease) stage 2, GFR 60-89 ml/min   . Essential hypertension, benign   . Type 2 diabetes mellitus ([place])     History reviewed. No pertinent surgical history.  Current Outpatient Prescriptions  Medication Sig Dispense Refill  . acetaminophen (TYLENOL) 500 MG tablet Take 500 mg by mouth every 6 (six) hours as needed.    . Ascorbic Acid (VITAMIN C) 1000 MG tablet Take 2,000 mg by mouth daily.    [name] aspirin EC 81 MG tablet Take 81 mg by mouth daily.    [name] atenolol (TENORMIN) 50 MG tablet Take 1 tablet (50 mg total) by mouth daily. 90 tablet 3  . Cyanocobalamin (VITAMIN B 12 PO) Take by mouth.    . enalapril (VASOTEC) 20 MG tablet Take 20 mg by mouth daily.    . fish oil-omega-3 fatty acids 1000 MG capsule Take 1 g by mouth daily.    . insulin glargine (LANTUS) 100 UNIT/ML injection Inject 20 Units into the skin at bedtime.      No current facility-administered medications for this visit.    Allergies:  Nsaids   Social History: The patient  reports that she has never smoked. She has never used smokeless tobacco. She reports that she does not drink alcohol or use drugs.   ROS:  Please see the history of present  illness. Otherwise, complete review of systems is positive for recent eye surgery.  All other systems are reviewed and negative.   Physical Exam: VS:  BP 132/68   Pulse 75   Wt 189 lb (85.7 kg)   SpO2 98%   BMI 30.51 kg/m , BMI Body mass index is 30.51 kg/m.  Wt Readings from Last 3 Encounters:  09/08/16 189 lb (85.7 kg)  09/09/15 197 lb (89.4 kg)  08/11/14 222 lb (100.7 kg)    General: Patient appears comfortable at rest. HEENT: Conjunctiva and lids normal, oropharynx clear with moist mucosa. Neck: Supple, no elevated JVP or carotid bruits, no thyromegaly. Lungs: Clear to auscultation, nonlabored breathing at rest. Cardiac: Regular rate and rhythm, no S3 or significant systolic murmur, no pericardial rub. Abdomen: Soft, nontender, bowel sounds present, no guarding or rebound. Extremities: No pitting edema, distal pulses 2+.  ECG: I personally reviewed the tracing from 09/09/2015 which showed sinus rhythm with PACs and low voltage.  Recent Labwork:  June 2016: Potassium 4.6, BUN 29, creatinine 1.0, AST 28, ALT 23, hemoglobin 12.9, platelets 158, cholesterol 155, triglycerides 72, HDL 58, LDL 83, hemoglobin A1c 6.6  Other Studies Reviewed Today:  Echocardiogram 08/23/2012: Study Conclusions  - Left ventricle: The cavity size was normal. There was mild concentric hypertrophy. Systolic function was normal. The estimated ejection fraction was in the range of 50%  to 55%. Wall motion was normal; there were no regional wall motion abnormalities. Doppler parameters are consistent with abnormal left ventricular relaxation (grade 1 diastolic dysfunction). - Aortic valve: Sclerosis without stenosis. - Mitral valve: Mild regurgitation. - Left atrium: The atrium was mildly dilated. - Right atrium: The atrium was mildly dilated. - Atrial septum: No defect or patent foramen ovale was identified.  Lexiscan Myoview 08/23/2012: Impression Exercise Capacity: Lexiscan with  no exercise. BP Response: Normal blood pressure response. Clinical Symptoms: No significant symptoms noted. ECG Impression: No significant ECG changes with Lexiscan. Comparison with Prior Nuclear Study: No images to compare  Overall Impression: Normal stress nuclear study.  LV Wall Motion: NL LV Function; NL Wall Motion  Assessment and Plan:  1. History of paroxysmal atrial flutter, no obvious recurrences over the last year. We have discussed anticoagulation in light of CHADSVASC score of 5, although she does not want to pursue this. She remains on aspirin and atenolol.  2. Essential hypertension, blood pressure adequately controlled on Vasotec.  Current medicines were reviewed with the patient today.   Orders Placed This Encounter  Procedures  . EKG 12-Lead    Disposition: Follow-up in one year, sooner in needed.  Signed, Satira Sark, MD, [facility] 09/08/2016 9:13 AM    [facility] [place]. [address] [place], [place] [address] Phone: [phone]; Fax: [phone]

## 2016-09-08 ENCOUNTER — Encounter: Payer: Self-pay | Admitting: Cardiology

## 2016-09-08 ENCOUNTER — Ambulatory Visit (INDEPENDENT_AMBULATORY_CARE_PROVIDER_SITE_OTHER): Payer: Medicare HMO | Admitting: Cardiology

## 2016-09-08 VITALS — BP 132/68 | HR 75 | Wt 189.0 lb

## 2016-09-08 DIAGNOSIS — I10 Essential (primary) hypertension: Secondary | ICD-10-CM | POA: Diagnosis not present

## 2016-09-08 DIAGNOSIS — I4892 Unspecified atrial flutter: Secondary | ICD-10-CM | POA: Diagnosis not present

## 2016-09-08 NOTE — Patient Instructions (Signed)
Your physician wants you to follow-up in: 1 year You will receive a reminder letter in the mail two months in advance. If you don't receive a letter, please call our office to schedule the follow-up appointment.   Your physician recommends that you continue on your current medications as directed. Please refer to the Current Medication list given to you today.    If you need a refill on your cardiac medications before your next appointment, please call your pharmacy.      Thank you for choosing [facility] !        

## 2016-09-14 DIAGNOSIS — C4441 Basal cell carcinoma of skin of scalp and neck: Secondary | ICD-10-CM | POA: Diagnosis not present

## 2016-09-14 DIAGNOSIS — C44619 Basal cell carcinoma of skin of left upper limb, including shoulder: Secondary | ICD-10-CM | POA: Diagnosis not present

## 2016-10-26 DIAGNOSIS — Z85828 Personal history of other malignant neoplasm of skin: Secondary | ICD-10-CM | POA: Diagnosis not present

## 2016-10-26 DIAGNOSIS — Z08 Encounter for follow-up examination after completed treatment for malignant neoplasm: Secondary | ICD-10-CM | POA: Diagnosis not present

## 2016-11-02 DIAGNOSIS — C4441 Basal cell carcinoma of skin of scalp and neck: Secondary | ICD-10-CM | POA: Diagnosis not present

## 2016-11-21 DIAGNOSIS — E1122 Type 2 diabetes mellitus with diabetic chronic kidney disease: Secondary | ICD-10-CM | POA: Diagnosis not present

## 2017-01-05 DIAGNOSIS — E113513 Type 2 diabetes mellitus with proliferative diabetic retinopathy with macular edema, bilateral: Secondary | ICD-10-CM | POA: Diagnosis not present

## 2017-01-25 DIAGNOSIS — H7091 Unspecified mastoiditis, right ear: Secondary | ICD-10-CM | POA: Diagnosis not present

## 2017-01-25 DIAGNOSIS — H9201 Otalgia, right ear: Secondary | ICD-10-CM | POA: Diagnosis not present

## 2017-01-25 DIAGNOSIS — Z6831 Body mass index (BMI) 31.0-31.9, adult: Secondary | ICD-10-CM | POA: Diagnosis not present

## 2017-01-25 DIAGNOSIS — L989 Disorder of the skin and subcutaneous tissue, unspecified: Secondary | ICD-10-CM | POA: Diagnosis not present

## 2017-02-14 DIAGNOSIS — H9209 Otalgia, unspecified ear: Secondary | ICD-10-CM | POA: Diagnosis not present

## 2017-02-14 DIAGNOSIS — H903 Sensorineural hearing loss, bilateral: Secondary | ICD-10-CM | POA: Diagnosis not present

## 2017-03-01 DIAGNOSIS — Z48817 Encounter for surgical aftercare following surgery on the skin and subcutaneous tissue: Secondary | ICD-10-CM | POA: Diagnosis not present

## 2017-03-01 DIAGNOSIS — Z85828 Personal history of other malignant neoplasm of skin: Secondary | ICD-10-CM | POA: Diagnosis not present

## 2017-03-06 DIAGNOSIS — E1122 Type 2 diabetes mellitus with diabetic chronic kidney disease: Secondary | ICD-10-CM | POA: Diagnosis not present

## 2017-03-06 DIAGNOSIS — I10 Essential (primary) hypertension: Secondary | ICD-10-CM | POA: Diagnosis not present

## 2017-03-08 DIAGNOSIS — E1122 Type 2 diabetes mellitus with diabetic chronic kidney disease: Secondary | ICD-10-CM | POA: Diagnosis not present

## 2017-03-08 DIAGNOSIS — L989 Disorder of the skin and subcutaneous tissue, unspecified: Secondary | ICD-10-CM | POA: Diagnosis not present

## 2017-03-08 DIAGNOSIS — N182 Chronic kidney disease, stage 2 (mild): Secondary | ICD-10-CM | POA: Diagnosis not present

## 2017-03-08 DIAGNOSIS — K589 Irritable bowel syndrome without diarrhea: Secondary | ICD-10-CM | POA: Diagnosis not present

## 2017-03-08 DIAGNOSIS — M179 Osteoarthritis of knee, unspecified: Secondary | ICD-10-CM | POA: Diagnosis not present

## 2017-03-08 DIAGNOSIS — I10 Essential (primary) hypertension: Secondary | ICD-10-CM | POA: Diagnosis not present

## 2017-03-08 DIAGNOSIS — I482 Chronic atrial fibrillation: Secondary | ICD-10-CM | POA: Diagnosis not present

## 2017-03-08 DIAGNOSIS — Z961 Presence of intraocular lens: Secondary | ICD-10-CM | POA: Diagnosis not present

## 2017-03-08 DIAGNOSIS — R69 Illness, unspecified: Secondary | ICD-10-CM | POA: Diagnosis not present

## 2017-03-08 DIAGNOSIS — Z6831 Body mass index (BMI) 31.0-31.9, adult: Secondary | ICD-10-CM | POA: Diagnosis not present

## 2017-03-21 DIAGNOSIS — E1122 Type 2 diabetes mellitus with diabetic chronic kidney disease: Secondary | ICD-10-CM | POA: Diagnosis not present

## 2017-07-13 DIAGNOSIS — E113513 Type 2 diabetes mellitus with proliferative diabetic retinopathy with macular edema, bilateral: Secondary | ICD-10-CM | POA: Diagnosis not present

## 2017-07-13 DIAGNOSIS — H35373 Puckering of macula, bilateral: Secondary | ICD-10-CM | POA: Diagnosis not present

## 2017-08-15 DIAGNOSIS — I10 Essential (primary) hypertension: Secondary | ICD-10-CM | POA: Diagnosis not present

## 2017-08-15 DIAGNOSIS — E1122 Type 2 diabetes mellitus with diabetic chronic kidney disease: Secondary | ICD-10-CM | POA: Diagnosis not present

## 2017-08-17 DIAGNOSIS — I10 Essential (primary) hypertension: Secondary | ICD-10-CM | POA: Diagnosis not present

## 2017-08-17 DIAGNOSIS — E1122 Type 2 diabetes mellitus with diabetic chronic kidney disease: Secondary | ICD-10-CM | POA: Diagnosis not present

## 2017-08-17 DIAGNOSIS — Z6830 Body mass index (BMI) 30.0-30.9, adult: Secondary | ICD-10-CM | POA: Diagnosis not present

## 2017-08-17 DIAGNOSIS — R202 Paresthesia of skin: Secondary | ICD-10-CM | POA: Diagnosis not present

## 2017-08-17 DIAGNOSIS — N182 Chronic kidney disease, stage 2 (mild): Secondary | ICD-10-CM | POA: Diagnosis not present

## 2017-08-23 DIAGNOSIS — E1122 Type 2 diabetes mellitus with diabetic chronic kidney disease: Secondary | ICD-10-CM | POA: Diagnosis not present

## 2017-10-10 NOTE — Progress Notes (Signed)
Cardiology Office Note  Date: 10/11/2017   ID: Jemina Scahill, DOB 05-04-1933, MRN [id number]  PCP: Celene Squibb, MD  Primary Cardiologist: Rozann Lesches, MD   Chief Complaint  Patient presents with  . Atrial Flutter    History of Present Illness: Evany Schecter is an 84 y.o. female last seen in March 2018.  She is here for a routine visit.  She does not report any palpitations or chest pain with activity.  No syncope.  She continues to follow with Dr. Nevada Crane for routine care.  I reviewed her medications which are listed below.  She states that her antihypertensives have been increased by Dr. Nevada Crane.  Blood pressure and heart rate are well controlled today.  I personally reviewed her ECG today which shows a sinus rhythm with poor R wave progression.  She has had no obvious interval atrial flutter and still prefers aspirin to anticoagulation.  Past Medical History:  Diagnosis Date  . Atrial flutter ([place])   . CKD (chronic kidney disease) stage 2, GFR 60-89 ml/min   . Essential hypertension, benign   . Type 2 diabetes mellitus ([place])     History reviewed. No pertinent surgical history.  Current Outpatient Medications  Medication Sig Dispense Refill  . acetaminophen (TYLENOL) 500 MG tablet Take 500 mg by mouth every 6 (six) hours as needed.    . Ascorbic Acid (VITAMIN C) 1000 MG tablet Take 2,000 mg by mouth daily.    [name] aspirin EC 81 MG tablet Take 81 mg by mouth daily.    [name] atenolol (TENORMIN) 50 MG tablet Take 1 tablet (50 mg total) by mouth daily. 90 tablet 3  . Cyanocobalamin (VITAMIN B 12 PO) Take by mouth.    . enalapril (VASOTEC) 20 MG tablet Take 20 mg by mouth daily.    . fish oil-omega-3 fatty acids 1000 MG capsule Take 1 g by mouth daily.    . insulin glargine (LANTUS) 100 UNIT/ML injection Inject 20 Units into the skin at bedtime.      No current facility-administered medications for this visit.    Allergies:  Nsaids   Social History: The patient  reports that she  has never smoked. She has never used smokeless tobacco. She reports that she does not drink alcohol or use drugs.   ROS:  Please see the history of present illness. Otherwise, complete review of systems is positive for intermittent left median nerve neuropathic symptoms.  All other systems are reviewed and negative.   Physical Exam: VS:  BP 122/68   Pulse 68   Ht 5' 6.5" (1.689 m)   Wt 187 lb 9.6 oz (85.1 kg)   SpO2 96%   BMI 29.83 kg/m , BMI Body mass index is 29.83 kg/m.  Wt Readings from Last 3 Encounters:  10/11/17 187 lb 9.6 oz (85.1 kg)  09/08/16 189 lb (85.7 kg)  09/09/15 197 lb (89.4 kg)    General: Elderly woman, appears comfortable at rest. HEENT: Conjunctiva and lids normal, oropharynx clear. Neck: Supple, no elevated JVP or carotid bruits, no thyromegaly. Lungs: Clear to auscultation, nonlabored breathing at rest. Cardiac: Regular rate and rhythm, no S3 or significant systolic murmur, no pericardial rub. Abdomen: Soft, nontender, bowel sounds present. Extremities: No pitting edema, distal pulses 2+.  ECG: I personally reviewed the tracing from 09/08/2016 which showed normal sinus rhythm with low voltage.  Recent Labwork:  June 2016: Potassium 4.6, BUN 29, creatinine 1.0, AST 28, ALT 23, hemoglobin 12.9, platelets 158, cholesterol 155,  triglycerides 72, HDL 58, LDL 83, hemoglobin A1c 6.6  Other Studies Reviewed Today:  Echocardiogram 08/23/2012: Study Conclusions  - Left ventricle: The cavity size was normal. There was mild concentric hypertrophy. Systolic function was normal. The estimated ejection fraction was in the range of 50% to 55%. Wall motion was normal; there were no regional wall motion abnormalities. Doppler parameters are consistent with abnormal left ventricular relaxation (grade 1 diastolic dysfunction). - Aortic valve: Sclerosis without stenosis. - Mitral valve: Mild regurgitation. - Left atrium: The atrium was mildly dilated. -  Right atrium: The atrium was mildly dilated. - Atrial septum: No defect or patent foramen ovale was identified.  Lexiscan Myoview 08/23/2012: Impression Exercise Capacity: Lexiscan with no exercise. BP Response: Normal blood pressure response. Clinical Symptoms: No significant symptoms noted. ECG Impression: No significant ECG changes with Lexiscan. Comparison with Prior Nuclear Study: No images to compare  Overall Impression: Normal stress nuclear study.  LV Wall Motion: NL LV Function; NL Wall Motion  Assessment and Plan:  1.  Paroxysmal atrial flutter.  She reports no palpitations and has had no obvious breakthrough events. ECG shows sinus rhythm today.  She prefers aspirin to anticoagulation although we have discussed stroke risk with CHADSVASC score of 5.  2.  Essential hypertension, continues on enalapril with follow-up per Dr. Nevada Crane.  Current medicines were reviewed with the patient today.   Orders Placed This Encounter  Procedures  . EKG 12-Lead    Disposition: Follow-up in 1 year.  Signed, Satira Sark, MD, [facility] 10/11/2017 9:43 AM    [facility] at [place]. [address] [place],  [address] Phone: [phone]; Fax: [phone]

## 2017-10-11 ENCOUNTER — Ambulatory Visit: Payer: Medicare HMO | Admitting: Cardiology

## 2017-10-11 ENCOUNTER — Encounter: Payer: Self-pay | Admitting: Cardiology

## 2017-10-11 VITALS — BP 122/68 | HR 68 | Ht 66.5 in | Wt 187.6 lb

## 2017-10-11 DIAGNOSIS — I10 Essential (primary) hypertension: Secondary | ICD-10-CM | POA: Diagnosis not present

## 2017-10-11 DIAGNOSIS — I4892 Unspecified atrial flutter: Secondary | ICD-10-CM

## 2017-10-11 NOTE — Patient Instructions (Signed)

## 2017-10-31 DIAGNOSIS — Z85828 Personal history of other malignant neoplasm of skin: Secondary | ICD-10-CM | POA: Diagnosis not present

## 2017-10-31 DIAGNOSIS — Z08 Encounter for follow-up examination after completed treatment for malignant neoplasm: Secondary | ICD-10-CM | POA: Diagnosis not present

## 2017-10-31 DIAGNOSIS — C44612 Basal cell carcinoma of skin of right upper limb, including shoulder: Secondary | ICD-10-CM | POA: Diagnosis not present

## 2017-10-31 DIAGNOSIS — L820 Inflamed seborrheic keratosis: Secondary | ICD-10-CM | POA: Diagnosis not present

## 2017-11-21 DIAGNOSIS — C44612 Basal cell carcinoma of skin of right upper limb, including shoulder: Secondary | ICD-10-CM | POA: Diagnosis not present

## 2017-11-29 DIAGNOSIS — H5022 Vertical strabismus, left eye: Secondary | ICD-10-CM | POA: Diagnosis not present

## 2017-11-29 DIAGNOSIS — E109 Type 1 diabetes mellitus without complications: Secondary | ICD-10-CM | POA: Diagnosis not present

## 2017-11-29 DIAGNOSIS — E10319 Type 1 diabetes mellitus with unspecified diabetic retinopathy without macular edema: Secondary | ICD-10-CM | POA: Diagnosis not present

## 2018-01-02 DIAGNOSIS — E1122 Type 2 diabetes mellitus with diabetic chronic kidney disease: Secondary | ICD-10-CM | POA: Diagnosis not present

## 2018-01-04 DIAGNOSIS — H26491 Other secondary cataract, right eye: Secondary | ICD-10-CM | POA: Diagnosis not present

## 2018-02-20 DIAGNOSIS — N182 Chronic kidney disease, stage 2 (mild): Secondary | ICD-10-CM | POA: Diagnosis not present

## 2018-02-20 DIAGNOSIS — I482 Chronic atrial fibrillation: Secondary | ICD-10-CM | POA: Diagnosis not present

## 2018-02-20 DIAGNOSIS — K589 Irritable bowel syndrome without diarrhea: Secondary | ICD-10-CM | POA: Diagnosis not present

## 2018-02-20 DIAGNOSIS — Z6830 Body mass index (BMI) 30.0-30.9, adult: Secondary | ICD-10-CM | POA: Diagnosis not present

## 2018-02-20 DIAGNOSIS — E1122 Type 2 diabetes mellitus with diabetic chronic kidney disease: Secondary | ICD-10-CM | POA: Diagnosis not present

## 2018-02-20 DIAGNOSIS — I10 Essential (primary) hypertension: Secondary | ICD-10-CM | POA: Diagnosis not present

## 2018-02-20 DIAGNOSIS — M179 Osteoarthritis of knee, unspecified: Secondary | ICD-10-CM | POA: Diagnosis not present

## 2018-02-20 DIAGNOSIS — R202 Paresthesia of skin: Secondary | ICD-10-CM | POA: Diagnosis not present

## 2018-02-23 DIAGNOSIS — Z6829 Body mass index (BMI) 29.0-29.9, adult: Secondary | ICD-10-CM | POA: Diagnosis not present

## 2018-02-23 DIAGNOSIS — E870 Hyperosmolality and hypernatremia: Secondary | ICD-10-CM | POA: Diagnosis not present

## 2018-02-23 DIAGNOSIS — R208 Other disturbances of skin sensation: Secondary | ICD-10-CM | POA: Diagnosis not present

## 2018-02-23 DIAGNOSIS — I10 Essential (primary) hypertension: Secondary | ICD-10-CM | POA: Diagnosis not present

## 2018-02-23 DIAGNOSIS — E1122 Type 2 diabetes mellitus with diabetic chronic kidney disease: Secondary | ICD-10-CM | POA: Diagnosis not present

## 2018-02-23 DIAGNOSIS — N182 Chronic kidney disease, stage 2 (mild): Secondary | ICD-10-CM | POA: Diagnosis not present

## 2018-03-15 DIAGNOSIS — Z6830 Body mass index (BMI) 30.0-30.9, adult: Secondary | ICD-10-CM | POA: Diagnosis not present

## 2018-03-15 DIAGNOSIS — Z0000 Encounter for general adult medical examination without abnormal findings: Secondary | ICD-10-CM | POA: Diagnosis not present

## 2018-04-13 DIAGNOSIS — E1122 Type 2 diabetes mellitus with diabetic chronic kidney disease: Secondary | ICD-10-CM | POA: Diagnosis not present

## 2018-04-18 DIAGNOSIS — Z0100 Encounter for examination of eyes and vision without abnormal findings: Secondary | ICD-10-CM | POA: Diagnosis not present

## 2018-06-19 DIAGNOSIS — E119 Type 2 diabetes mellitus without complications: Secondary | ICD-10-CM | POA: Diagnosis not present

## 2018-06-19 DIAGNOSIS — I10 Essential (primary) hypertension: Secondary | ICD-10-CM | POA: Diagnosis not present

## 2018-07-31 DIAGNOSIS — I10 Essential (primary) hypertension: Secondary | ICD-10-CM | POA: Diagnosis not present

## 2018-07-31 DIAGNOSIS — E119 Type 2 diabetes mellitus without complications: Secondary | ICD-10-CM | POA: Diagnosis not present

## 2018-10-11 DIAGNOSIS — N182 Chronic kidney disease, stage 2 (mild): Secondary | ICD-10-CM | POA: Diagnosis not present

## 2018-10-11 DIAGNOSIS — I10 Essential (primary) hypertension: Secondary | ICD-10-CM | POA: Diagnosis not present

## 2018-10-11 DIAGNOSIS — Z0001 Encounter for general adult medical examination with abnormal findings: Secondary | ICD-10-CM | POA: Diagnosis not present

## 2018-10-11 DIAGNOSIS — E1122 Type 2 diabetes mellitus with diabetic chronic kidney disease: Secondary | ICD-10-CM | POA: Diagnosis not present

## 2018-10-17 ENCOUNTER — Telehealth: Payer: Self-pay | Admitting: Cardiology

## 2018-10-17 NOTE — Telephone Encounter (Signed)
Virtual Visit Pre-Appointment Phone Call  "(Name), I am calling you today to discuss your upcoming appointment. We are currently trying to limit exposure to the virus that causes COVID-19 by seeing patients at home rather than in the office."  1. "What is the BEST phone number to call the day of the visit?" - include this in appointment notes  2. Do you have or have access to (through a family member/friend) a smartphone with video capability that we can use for your visit?" a. If yes - list this number in appt notes as cell (if different from BEST phone #) and list the appointment type as a VIDEO visit in appointment notes b. If no - list the appointment type as a PHONE visit in appointment notes  3. Confirm consent - "In the setting of the current Covid19 crisis, you are scheduled for a (phone or video) visit with your provider on (date) at (time).  Just as we do with many in-office visits, in order for you to participate in this visit, we must obtain consent.  If you'd like, I can send this to your mychart (if signed up) or email for you to review.  Otherwise, I can obtain your verbal consent now.  All virtual visits are billed to your insurance company just like a normal visit would be.  By agreeing to a virtual visit, we'd like you to understand that the technology does not allow for your provider to perform an examination, and thus may limit your provider's ability to fully assess your condition. If your provider identifies any concerns that need to be evaluated in person, we will make arrangements to do so.  Finally, though the technology is pretty good, we cannot assure that it will always work on either your or our end, and in the setting of a video visit, we may have to convert it to a phone-only visit.  In either situation, we cannot ensure that we have a secure connection.  Are you willing to proceed?" STAFF: Did the patient verbally acknowledge consent to telehealth visit? Document  YES/NO here:Yes  4. Advise patient to be prepared - "Two hours prior to your appointment, go ahead and check your blood pressure, pulse, oxygen saturation, and your weight (if you have the equipment to check those) and write them all down. When your visit starts, your provider will ask you for this information. If you have an Apple Watch or Kardia device, please plan to have heart rate information ready on the day of your appointment. Please have a pen and paper handy nearby the day of the visit as well."  5. Give patient instructions for MyChart download to smartphone OR [email or web address] as below if video visit (depending on what platform provider is using)  6. Inform patient they will receive a phone call 15 minutes prior to their appointment time (may be from unknown caller ID) so they should be prepared to answer    TELEPHONE CALL NOTE  Audrey Walker has been deemed a candidate for a follow-up tele-health visit to limit community exposure during the Covid-19 pandemic. I spoke with the patient via phone to ensure availability of phone/video source, confirm preferred email & phone number, and discuss instructions and expectations.  I reminded Audrey Walker to be prepared with any vital sign and/or heart rhythm information that could potentially be obtained via home monitoring, at the time of her visit. I reminded Audrey Walker to expect a phone call prior to her visit.  Audrey Mark  L Walker 10/17/2018 2:24 PM

## 2018-10-17 NOTE — Progress Notes (Signed)
Virtual Visit via Telephone Note   This visit type was conducted due to national recommendations for restrictions regarding the COVID-19 Pandemic (e.g. social distancing) in an effort to limit this patient's exposure and mitigate transmission in our community.  Due to her co-morbid illnesses, this patient is at least at moderate risk for complications without adequate follow up.  This format is felt to be most appropriate for this patient at this time.  The patient did not have access to video technology/had technical difficulties with video requiring transitioning to audio format only (telephone).  All issues noted in this document were discussed and addressed.  No physical exam could be performed with this format.  Please refer to the patient's chart for her  consent to telehealth for [facility].   Date:  10/18/2018   ID:  Audrey Walker, DOB [birth year], MRN [id number]  Patient Location: Home Provider Location: Office  PCP:  Celene Squibb, MD  Cardiologist:  Rozann Lesches, MD  Evaluation Performed:  Follow-Up Visit  Chief Complaint:   Cardiac follow-up  History of Present Illness:    Audrey Walker is an 85 y.o. female last seen in May 2019.  She did not have video access today and we spoke by phone.  She tells me that she has been doing well over the last year.  Specifically, no palpitations, dizziness, speech changes or focal motor weakness.  No exertional chest pain.  No syncope.  CHADSVASC score is 5, she has consistently declined anticoagulation so far.  She remains on aspirin.  I reviewed her medications which are listed below.  She reports no intolerances.  She states that she has been social distancing.  Her daughter goes out and gets her groceries for her.  The patient does not have symptoms concerning for COVID-19 infection (fever, chills, cough, or new shortness of breath).    Past Medical History:  Diagnosis Date  . Atrial flutter ([place])   . CKD (chronic kidney  disease) stage 2, GFR 60-89 ml/min   . Essential hypertension, benign   . Type 2 diabetes mellitus (HCC)    No past surgical history on file.   Current Meds  Medication Sig  . acetaminophen (TYLENOL) 500 MG tablet Take 500 mg by mouth every 6 (six) hours as needed.  . Ascorbic Acid (VITAMIN C) 1000 MG tablet Take 2,000 mg by mouth daily.  [name] aspirin EC 81 MG tablet Take 81 mg by mouth daily as needed.   [name] atenolol (TENORMIN) 50 MG tablet Take 1 tablet (50 mg total) by mouth daily.  . Cyanocobalamin (VITAMIN B 12 PO) Take 1 tablet by mouth daily.   . enalapril (VASOTEC) 20 MG tablet Take 20 mg by mouth daily.  . fish oil-omega-3 fatty acids 1000 MG capsule Take 1 g by mouth daily.  . insulin glargine (LANTUS) 100 UNIT/ML injection Inject 20 Units into the skin at bedtime.      Allergies:   Nsaids   Social History   Tobacco Use  . Smoking status: Never Smoker  . Smokeless tobacco: Never Used  Substance Use Topics  . Alcohol use: No  . Drug use: No     Family Hx: The patient's family history includes Diabetes Mellitus II in her unknown relative; Hypertension in her unknown relative.  ROS:   Please see the history of present illness.    All other systems reviewed and are negative.   Prior CV studies:   The following studies were reviewed today:  Echocardiogram 08/23/2012:  Study Conclusions  - Left ventricle: The cavity size was normal. There was mild concentric hypertrophy. Systolic function was normal. The estimated ejection fraction was in the range of 50% to 55%. Wall motion was normal; there were no regional wall motion abnormalities. Doppler parameters are consistent with abnormal left ventricular relaxation (grade 1 diastolic dysfunction). - Aortic valve: Sclerosis without stenosis. - Mitral valve: Mild regurgitation. - Left atrium: The atrium was mildly dilated. - Right atrium: The atrium was mildly dilated. - Atrial septum: No defect or patent  foramen ovale was identified.  Lexiscan Myoview 08/23/2012: Impression Exercise Capacity: Lexiscan with no exercise. BP Response: Normal blood pressure response. Clinical Symptoms: No significant symptoms noted. ECG Impression: No significant ECG changes with Lexiscan. Comparison with Prior Nuclear Study: No images to compare  Overall Impression: Normal stress nuclear study.  LV Wall Motion: NL LV Function; NL Wall Motion  Labs/Other Tests and Data Reviewed:    EKG:  An ECG dated 10/11/2017 was personally reviewed today and demonstrated:  Sinus rhythm with poor R wave progression.  Recent Labs:  June 2016: Potassium 4.6, BUN 29, creatinine 1.0, AST 28, ALT 23, hemoglobin 12.9, platelets 158, cholesterol 155, triglycerides 72, HDL 58, LDL 83, hemoglobin A1c 6.6  Wt Readings from Last 3 Encounters:  10/18/18 189 lb (85.7 kg)  10/11/17 187 lb 9.6 oz (85.1 kg)  09/08/16 189 lb (85.7 kg)     Objective:    Vital Signs:  Ht 5' 6.5" (1.689 m)   Wt 189 lb (85.7 kg)   BMI 30.05 kg/m    She was not able to check her vital signs today. She was not breathless while speaking in full sentences on the phone.  Voice tone and speech pattern were normal.  No audible wheezing.  ASSESSMENT & PLAN:    1.  Paroxysmal atrial flutter.  She does not report any interval palpitations on atenolol.  She continues to decline anticoagulation, CHADSVASC score is 5.  Continue aspirin.  2.  Essential hypertension.  No changes made to current regimen.  She was not able to check her blood pressure today.  Keep follow-up with Dr. Nevada Crane.  COVID-19 Education: The signs and symptoms of COVID-19 were discussed with the patient and how to seek care for testing (follow up with PCP or arrange E-visit).  The importance of social distancing was discussed today.  Time:   Today, I have spent 6 minutes with the patient with telehealth technology discussing the above problems.     Medication Adjustments/Labs  and Tests Ordered: Current medicines are reviewed at length with the patient today.  Concerns regarding medicines are outlined above.   Tests Ordered: No orders of the defined types were placed in this encounter.   Medication Changes: No orders of the defined types were placed in this encounter.   Disposition:  Follow up 1 year in the office.  Signed, Rozann Lesches, MD  10/18/2018 2:43 PM    [place]

## 2018-10-18 ENCOUNTER — Encounter: Payer: Self-pay | Admitting: *Deleted

## 2018-10-18 ENCOUNTER — Encounter: Payer: Self-pay | Admitting: Cardiology

## 2018-10-18 ENCOUNTER — Telehealth (INDEPENDENT_AMBULATORY_CARE_PROVIDER_SITE_OTHER): Payer: Medicare HMO | Admitting: Cardiology

## 2018-10-18 VITALS — Ht 66.5 in | Wt 189.0 lb

## 2018-10-18 DIAGNOSIS — Z7189 Other specified counseling: Secondary | ICD-10-CM | POA: Diagnosis not present

## 2018-10-18 DIAGNOSIS — I4892 Unspecified atrial flutter: Secondary | ICD-10-CM

## 2018-10-18 DIAGNOSIS — I10 Essential (primary) hypertension: Secondary | ICD-10-CM | POA: Diagnosis not present

## 2018-10-18 NOTE — Patient Instructions (Addendum)

## 2018-10-23 ENCOUNTER — Telehealth: Payer: Medicare HMO | Admitting: Cardiology

## 2018-10-26 DIAGNOSIS — R69 Illness, unspecified: Secondary | ICD-10-CM | POA: Diagnosis not present

## 2018-11-20 DIAGNOSIS — N182 Chronic kidney disease, stage 2 (mild): Secondary | ICD-10-CM | POA: Diagnosis not present

## 2018-11-20 DIAGNOSIS — E1122 Type 2 diabetes mellitus with diabetic chronic kidney disease: Secondary | ICD-10-CM | POA: Diagnosis not present

## 2018-11-20 DIAGNOSIS — I10 Essential (primary) hypertension: Secondary | ICD-10-CM | POA: Diagnosis not present

## 2018-11-27 DIAGNOSIS — Z6830 Body mass index (BMI) 30.0-30.9, adult: Secondary | ICD-10-CM | POA: Diagnosis not present

## 2018-11-27 DIAGNOSIS — N183 Chronic kidney disease, stage 3 (moderate): Secondary | ICD-10-CM | POA: Diagnosis not present

## 2018-11-27 DIAGNOSIS — E669 Obesity, unspecified: Secondary | ICD-10-CM | POA: Diagnosis not present

## 2018-11-27 DIAGNOSIS — E1122 Type 2 diabetes mellitus with diabetic chronic kidney disease: Secondary | ICD-10-CM | POA: Diagnosis not present

## 2018-11-27 DIAGNOSIS — I129 Hypertensive chronic kidney disease with stage 1 through stage 4 chronic kidney disease, or unspecified chronic kidney disease: Secondary | ICD-10-CM | POA: Diagnosis not present

## 2018-11-28 DIAGNOSIS — E083293 Diabetes mellitus due to underlying condition with mild nonproliferative diabetic retinopathy without macular edema, bilateral: Secondary | ICD-10-CM | POA: Diagnosis not present

## 2019-01-16 DIAGNOSIS — R69 Illness, unspecified: Secondary | ICD-10-CM | POA: Diagnosis not present

## 2019-02-27 DIAGNOSIS — N183 Chronic kidney disease, stage 3 (moderate): Secondary | ICD-10-CM | POA: Diagnosis not present

## 2019-02-27 DIAGNOSIS — N182 Chronic kidney disease, stage 2 (mild): Secondary | ICD-10-CM | POA: Diagnosis not present

## 2019-02-27 DIAGNOSIS — E119 Type 2 diabetes mellitus without complications: Secondary | ICD-10-CM | POA: Diagnosis not present

## 2019-02-27 DIAGNOSIS — I10 Essential (primary) hypertension: Secondary | ICD-10-CM | POA: Diagnosis not present

## 2019-03-28 DIAGNOSIS — E119 Type 2 diabetes mellitus without complications: Secondary | ICD-10-CM | POA: Diagnosis not present

## 2019-03-28 DIAGNOSIS — N182 Chronic kidney disease, stage 2 (mild): Secondary | ICD-10-CM | POA: Diagnosis not present

## 2019-03-28 DIAGNOSIS — I10 Essential (primary) hypertension: Secondary | ICD-10-CM | POA: Diagnosis not present

## 2019-04-09 DIAGNOSIS — R69 Illness, unspecified: Secondary | ICD-10-CM | POA: Diagnosis not present

## 2019-05-13 DIAGNOSIS — I10 Essential (primary) hypertension: Secondary | ICD-10-CM | POA: Diagnosis not present

## 2019-05-13 DIAGNOSIS — E119 Type 2 diabetes mellitus without complications: Secondary | ICD-10-CM | POA: Diagnosis not present

## 2019-05-22 DIAGNOSIS — I10 Essential (primary) hypertension: Secondary | ICD-10-CM | POA: Diagnosis not present

## 2019-05-22 DIAGNOSIS — E1122 Type 2 diabetes mellitus with diabetic chronic kidney disease: Secondary | ICD-10-CM | POA: Diagnosis not present

## 2019-05-22 DIAGNOSIS — E669 Obesity, unspecified: Secondary | ICD-10-CM | POA: Diagnosis not present

## 2019-05-22 DIAGNOSIS — N183 Chronic kidney disease, stage 3 unspecified: Secondary | ICD-10-CM | POA: Diagnosis not present

## 2019-05-29 DIAGNOSIS — M79642 Pain in left hand: Secondary | ICD-10-CM | POA: Diagnosis not present

## 2019-05-29 DIAGNOSIS — I129 Hypertensive chronic kidney disease with stage 1 through stage 4 chronic kidney disease, or unspecified chronic kidney disease: Secondary | ICD-10-CM | POA: Diagnosis not present

## 2019-05-29 DIAGNOSIS — E1122 Type 2 diabetes mellitus with diabetic chronic kidney disease: Secondary | ICD-10-CM | POA: Diagnosis not present

## 2019-06-25 DIAGNOSIS — E119 Type 2 diabetes mellitus without complications: Secondary | ICD-10-CM | POA: Diagnosis not present

## 2019-06-25 DIAGNOSIS — I10 Essential (primary) hypertension: Secondary | ICD-10-CM | POA: Diagnosis not present

## 2019-07-02 DIAGNOSIS — R69 Illness, unspecified: Secondary | ICD-10-CM | POA: Diagnosis not present

## 2019-08-05 DIAGNOSIS — H5200 Hypermetropia, unspecified eye: Secondary | ICD-10-CM | POA: Diagnosis not present

## 2019-08-05 DIAGNOSIS — Z0100 Encounter for examination of eyes and vision without abnormal findings: Secondary | ICD-10-CM | POA: Diagnosis not present

## 2019-08-13 DIAGNOSIS — M79642 Pain in left hand: Secondary | ICD-10-CM | POA: Diagnosis not present

## 2019-08-13 DIAGNOSIS — I129 Hypertensive chronic kidney disease with stage 1 through stage 4 chronic kidney disease, or unspecified chronic kidney disease: Secondary | ICD-10-CM | POA: Diagnosis not present

## 2019-08-13 DIAGNOSIS — E1122 Type 2 diabetes mellitus with diabetic chronic kidney disease: Secondary | ICD-10-CM | POA: Diagnosis not present

## 2019-08-23 DIAGNOSIS — E7849 Other hyperlipidemia: Secondary | ICD-10-CM | POA: Diagnosis not present

## 2019-08-23 DIAGNOSIS — I129 Hypertensive chronic kidney disease with stage 1 through stage 4 chronic kidney disease, or unspecified chronic kidney disease: Secondary | ICD-10-CM | POA: Diagnosis not present

## 2019-08-23 DIAGNOSIS — E1122 Type 2 diabetes mellitus with diabetic chronic kidney disease: Secondary | ICD-10-CM | POA: Diagnosis not present

## 2019-08-23 DIAGNOSIS — N183 Chronic kidney disease, stage 3 unspecified: Secondary | ICD-10-CM | POA: Diagnosis not present

## 2019-10-11 ENCOUNTER — Other Ambulatory Visit: Payer: Self-pay

## 2019-10-11 ENCOUNTER — Encounter: Payer: Self-pay | Admitting: Cardiology

## 2019-10-11 ENCOUNTER — Ambulatory Visit: Payer: Medicare HMO | Admitting: Cardiology

## 2019-10-11 VITALS — BP 130/62 | HR 70 | Temp 98.2°F | Ht 66.0 in | Wt 183.4 lb

## 2019-10-11 DIAGNOSIS — I10 Essential (primary) hypertension: Secondary | ICD-10-CM

## 2019-10-11 DIAGNOSIS — I4892 Unspecified atrial flutter: Secondary | ICD-10-CM | POA: Diagnosis not present

## 2019-10-11 NOTE — Progress Notes (Signed)
Cardiology Office Note  Date: 10/11/2019   ID: Audrey Walker, DOB 1933/04/28, MRN [id number]  PCP:  Celene Squibb, MD  Cardiologist:  Rozann Lesches, MD Electrophysiologist:  None   Chief Complaint  Patient presents with  . Cardiac follow-up    History of Present Illness: Audrey Walker is an 86 y.o. female last assessed via telehealth encounter in May 2020.  She presents for a routine follow-up visit.  She states that she has done well in general, no active palpitations or increasing breathlessness with typical ADLs.  She does not report any falls.  Review her medications which are stable and outlined below.  She remains on atenolol and enalapril.  Blood pressure and heart rate are reasonable today.  CHA2DS2-VASc score is 5.  She declines anticoagulation.  She does take an aspirin daily.  I personally reviewed her ECG today which shows sinus rhythm with PACs and PVC, low voltage.  Past Medical History:  Diagnosis Date  . Atrial flutter ([place])   . CKD (chronic kidney disease) stage 2, GFR 60-89 ml/min   . Essential hypertension, benign   . Type 2 diabetes mellitus ([place])     History reviewed. No pertinent surgical history.  Current Outpatient Medications  Medication Sig Dispense Refill  . acetaminophen (TYLENOL) 500 MG tablet Take 500 mg by mouth every 6 (six) hours as needed.    . Ascorbic Acid (VITAMIN C) 1000 MG tablet Take 2,000 mg by mouth daily.    [name] aspirin EC 81 MG tablet Take 81 mg by mouth daily as needed.     [name] atenolol (TENORMIN) 50 MG tablet Take 1 tablet (50 mg total) by mouth daily. 90 tablet 3  . B-D ULTRAFINE III SHORT PEN 31G X 8 MM MISC Inject 1 Syringe into the skin daily.    . Blood Glucose Monitoring Suppl (ONE TOUCH ULTRA 2) w/Device KIT     . Cyanocobalamin (VITAMIN B 12 PO) Take 1 tablet by mouth daily.     . enalapril (VASOTEC) 20 MG tablet Take 20 mg by mouth daily.    . fish oil-omega-3 fatty acids 1000 MG capsule Take 1 g by mouth daily.    .  insulin glargine (LANTUS) 100 UNIT/ML injection Inject 16 Units into the skin at bedtime.      No current facility-administered medications for this visit.   Allergies:  Nsaids   ROS:  Unsteady gait.  Physical Exam: VS:  BP 130/62   Pulse 70   Temp 98.2 F (36.8 C)   Ht '5\' 6"'$  (1.676 m)   Wt 183 lb 6.4 oz (83.2 kg)   SpO2 96%   BMI 29.60 kg/m , BMI Body mass index is 29.6 kg/m.  Wt Readings from Last 3 Encounters:  10/11/19 183 lb 6.4 oz (83.2 kg)  10/18/18 189 lb (85.7 kg)  10/11/17 187 lb 9.6 oz (85.1 kg)    General: Elderly woman, appears comfortable at rest. HEENT: Conjunctiva and lids normal, wearing a mask. Neck: Supple, no elevated JVP or carotid bruits, no thyromegaly. Lungs: Clear to auscultation, nonlabored breathing at rest. Cardiac: RRR with ectopy, no S3 or significant systolic murmur. Abdomen: Soft, bowel sounds present. Extremities: No pitting edema, distal pulses 2+.  ECG:  An ECG dated 10/11/2017 was personally reviewed today and demonstrated:  Sinus rhythm with poor R wave progression.  Recent Labwork:  No interval lab work for review today.  Other Studies Reviewed Today:  Echocardiogram 08/23/2012: Study Conclusions  - Left ventricle: The  cavity size was normal. There was mild concentric hypertrophy. Systolic function was normal. The estimated ejection fraction was in the range of 50% to 55%. Wall motion was normal; there were no regional wall motion abnormalities. Doppler parameters are consistent with abnormal left ventricular relaxation (grade 1 diastolic dysfunction). - Aortic valve: Sclerosis without stenosis. - Mitral valve: Mild regurgitation. - Left atrium: The atrium was mildly dilated. - Right atrium: The atrium was mildly dilated. - Atrial septum: No defect or patent foramen ovale was identified.  Lexiscan Myoview 08/23/2012: Impression Exercise Capacity: Lexiscan with no exercise. BP Response: Normal blood pressure  response. Clinical Symptoms: No significant symptoms noted. ECG Impression: No significant ECG changes with Lexiscan. Comparison with Prior Nuclear Study: No images to compare  Overall Impression: Normal stress nuclear study.  LV Wall Motion: NL LV Function; NL Wall Motion  Assessment and Plan:  1.  Paroxysmal atrial flutter.  CHA2DS2-VASc score is 5, she declines anticoagulation but continues on low-dose aspirin.  Continue atenolol, no active palpitations.  She is in sinus rhythm today with PACs.  2.  Essential hypertension, systolic is 175.  Continue enalapril with follow-up by Dr. Nevada Crane.  Medication Adjustments/Labs and Tests Ordered: Current medicines are reviewed at length with the patient today.  Concerns regarding medicines are outlined above.   Tests Ordered: Orders Placed This Encounter  Procedures  . EKG 12-Lead    Medication Changes: No orders of the defined types were placed in this encounter.   Disposition:  Follow up 1 year in the [place] office.  Signed, Satira Sark, MD, [facility] 10/11/2019 11:44 AM    [place] [facility] [place]. [address] [place], [place] [address] Phone: [phone]; Fax: [phone]

## 2019-10-11 NOTE — Patient Instructions (Signed)
Medication Instructions: Your physician recommends that you continue on your current medications as directed. Please refer to the Current Medication list given to you today.   Labwork: none today  Procedures/Testing: None today  Follow-Up: 1 year office visit with Dr.McDowell  Any Additional Special Instructions Will Be Listed Below (If Applicable).     If you need a refill on your cardiac medications before your next appointment, please call your pharmacy.     Thank you for choosing [facility] !

## 2019-10-22 DIAGNOSIS — E119 Type 2 diabetes mellitus without complications: Secondary | ICD-10-CM | POA: Diagnosis not present

## 2019-10-22 DIAGNOSIS — K589 Irritable bowel syndrome without diarrhea: Secondary | ICD-10-CM | POA: Diagnosis not present

## 2019-10-22 DIAGNOSIS — I129 Hypertensive chronic kidney disease with stage 1 through stage 4 chronic kidney disease, or unspecified chronic kidney disease: Secondary | ICD-10-CM | POA: Diagnosis not present

## 2019-10-22 DIAGNOSIS — E669 Obesity, unspecified: Secondary | ICD-10-CM | POA: Diagnosis not present

## 2019-10-22 DIAGNOSIS — M179 Osteoarthritis of knee, unspecified: Secondary | ICD-10-CM | POA: Diagnosis not present

## 2019-10-22 DIAGNOSIS — E1122 Type 2 diabetes mellitus with diabetic chronic kidney disease: Secondary | ICD-10-CM | POA: Diagnosis not present

## 2019-10-22 DIAGNOSIS — M79642 Pain in left hand: Secondary | ICD-10-CM | POA: Diagnosis not present

## 2019-10-22 DIAGNOSIS — E870 Hyperosmolality and hypernatremia: Secondary | ICD-10-CM | POA: Diagnosis not present

## 2019-10-22 DIAGNOSIS — E7849 Other hyperlipidemia: Secondary | ICD-10-CM | POA: Diagnosis not present

## 2019-10-22 DIAGNOSIS — I10 Essential (primary) hypertension: Secondary | ICD-10-CM | POA: Diagnosis not present

## 2019-10-25 DIAGNOSIS — E1122 Type 2 diabetes mellitus with diabetic chronic kidney disease: Secondary | ICD-10-CM | POA: Diagnosis not present

## 2019-10-25 DIAGNOSIS — N1831 Chronic kidney disease, stage 3a: Secondary | ICD-10-CM | POA: Diagnosis not present

## 2019-10-25 DIAGNOSIS — I129 Hypertensive chronic kidney disease with stage 1 through stage 4 chronic kidney disease, or unspecified chronic kidney disease: Secondary | ICD-10-CM | POA: Diagnosis not present

## 2019-12-20 DIAGNOSIS — E1122 Type 2 diabetes mellitus with diabetic chronic kidney disease: Secondary | ICD-10-CM | POA: Diagnosis not present

## 2020-01-21 DIAGNOSIS — E1122 Type 2 diabetes mellitus with diabetic chronic kidney disease: Secondary | ICD-10-CM | POA: Diagnosis not present

## 2020-01-21 DIAGNOSIS — N183 Chronic kidney disease, stage 3 unspecified: Secondary | ICD-10-CM | POA: Diagnosis not present

## 2020-01-21 DIAGNOSIS — E7849 Other hyperlipidemia: Secondary | ICD-10-CM | POA: Diagnosis not present

## 2020-01-21 DIAGNOSIS — I129 Hypertensive chronic kidney disease with stage 1 through stage 4 chronic kidney disease, or unspecified chronic kidney disease: Secondary | ICD-10-CM | POA: Diagnosis not present

## 2020-02-25 DIAGNOSIS — I129 Hypertensive chronic kidney disease with stage 1 through stage 4 chronic kidney disease, or unspecified chronic kidney disease: Secondary | ICD-10-CM | POA: Diagnosis not present

## 2020-02-25 DIAGNOSIS — N183 Chronic kidney disease, stage 3 unspecified: Secondary | ICD-10-CM | POA: Diagnosis not present

## 2020-02-25 DIAGNOSIS — E1122 Type 2 diabetes mellitus with diabetic chronic kidney disease: Secondary | ICD-10-CM | POA: Diagnosis not present

## 2020-02-25 DIAGNOSIS — E7849 Other hyperlipidemia: Secondary | ICD-10-CM | POA: Diagnosis not present

## 2020-03-19 DIAGNOSIS — R202 Paresthesia of skin: Secondary | ICD-10-CM | POA: Diagnosis not present

## 2020-03-19 DIAGNOSIS — Z6830 Body mass index (BMI) 30.0-30.9, adult: Secondary | ICD-10-CM | POA: Diagnosis not present

## 2020-03-19 DIAGNOSIS — Z0001 Encounter for general adult medical examination with abnormal findings: Secondary | ICD-10-CM | POA: Diagnosis not present

## 2020-03-19 DIAGNOSIS — Z712 Person consulting for explanation of examination or test findings: Secondary | ICD-10-CM | POA: Diagnosis not present

## 2020-03-19 DIAGNOSIS — M179 Osteoarthritis of knee, unspecified: Secondary | ICD-10-CM | POA: Diagnosis not present

## 2020-03-19 DIAGNOSIS — E1122 Type 2 diabetes mellitus with diabetic chronic kidney disease: Secondary | ICD-10-CM | POA: Diagnosis not present

## 2020-03-19 DIAGNOSIS — N182 Chronic kidney disease, stage 2 (mild): Secondary | ICD-10-CM | POA: Diagnosis not present

## 2020-03-19 DIAGNOSIS — N1831 Chronic kidney disease, stage 3a: Secondary | ICD-10-CM | POA: Diagnosis not present

## 2020-03-19 DIAGNOSIS — K589 Irritable bowel syndrome without diarrhea: Secondary | ICD-10-CM | POA: Diagnosis not present

## 2020-03-19 DIAGNOSIS — I10 Essential (primary) hypertension: Secondary | ICD-10-CM | POA: Diagnosis not present

## 2020-04-08 DIAGNOSIS — E1122 Type 2 diabetes mellitus with diabetic chronic kidney disease: Secondary | ICD-10-CM | POA: Diagnosis not present

## 2020-04-08 DIAGNOSIS — E870 Hyperosmolality and hypernatremia: Secondary | ICD-10-CM | POA: Diagnosis not present

## 2020-04-08 DIAGNOSIS — M79642 Pain in left hand: Secondary | ICD-10-CM | POA: Diagnosis not present

## 2020-04-08 DIAGNOSIS — E785 Hyperlipidemia, unspecified: Secondary | ICD-10-CM | POA: Diagnosis not present

## 2020-04-08 DIAGNOSIS — I129 Hypertensive chronic kidney disease with stage 1 through stage 4 chronic kidney disease, or unspecified chronic kidney disease: Secondary | ICD-10-CM | POA: Diagnosis not present

## 2020-05-19 DIAGNOSIS — E1122 Type 2 diabetes mellitus with diabetic chronic kidney disease: Secondary | ICD-10-CM | POA: Diagnosis not present

## 2020-05-26 DIAGNOSIS — J069 Acute upper respiratory infection, unspecified: Secondary | ICD-10-CM | POA: Diagnosis not present

## 2020-06-01 ENCOUNTER — Inpatient Hospital Stay (HOSPITAL_COMMUNITY)
Admission: EM | Admit: 2020-06-01 | Discharge: 2020-06-03 | DRG: 178 | Disposition: A | Discharge status: Home Health | Payer: Medicare HMO | Attending: Internal Medicine | Admitting: Internal Medicine

## 2020-06-01 ENCOUNTER — Inpatient Hospital Stay (HOSPITAL_COMMUNITY): Payer: Medicare HMO

## 2020-06-01 ENCOUNTER — Encounter (HOSPITAL_COMMUNITY): Payer: Self-pay | Admitting: *Deleted

## 2020-06-01 DIAGNOSIS — E1169 Type 2 diabetes mellitus with other specified complication: Secondary | ICD-10-CM | POA: Diagnosis not present

## 2020-06-01 DIAGNOSIS — I129 Hypertensive chronic kidney disease with stage 1 through stage 4 chronic kidney disease, or unspecified chronic kidney disease: Secondary | ICD-10-CM | POA: Diagnosis present

## 2020-06-01 DIAGNOSIS — Z7982 Long term (current) use of aspirin: Secondary | ICD-10-CM

## 2020-06-01 DIAGNOSIS — I4892 Unspecified atrial flutter: Secondary | ICD-10-CM | POA: Diagnosis not present

## 2020-06-01 DIAGNOSIS — R531 Weakness: Secondary | ICD-10-CM | POA: Diagnosis not present

## 2020-06-01 DIAGNOSIS — E871 Hypo-osmolality and hyponatremia: Secondary | ICD-10-CM | POA: Diagnosis not present

## 2020-06-01 DIAGNOSIS — E1122 Type 2 diabetes mellitus with diabetic chronic kidney disease: Secondary | ICD-10-CM | POA: Diagnosis present

## 2020-06-01 DIAGNOSIS — J811 Chronic pulmonary edema: Secondary | ICD-10-CM | POA: Diagnosis not present

## 2020-06-01 DIAGNOSIS — D61818 Other pancytopenia: Secondary | ICD-10-CM | POA: Diagnosis not present

## 2020-06-01 DIAGNOSIS — E860 Dehydration: Secondary | ICD-10-CM | POA: Diagnosis not present

## 2020-06-01 DIAGNOSIS — Z794 Long term (current) use of insulin: Secondary | ICD-10-CM

## 2020-06-01 DIAGNOSIS — I10 Essential (primary) hypertension: Secondary | ICD-10-CM | POA: Diagnosis not present

## 2020-06-01 DIAGNOSIS — I4891 Unspecified atrial fibrillation: Secondary | ICD-10-CM | POA: Diagnosis present

## 2020-06-01 DIAGNOSIS — E861 Hypovolemia: Secondary | ICD-10-CM | POA: Diagnosis present

## 2020-06-01 DIAGNOSIS — N1831 Chronic kidney disease, stage 3a: Secondary | ICD-10-CM | POA: Diagnosis present

## 2020-06-01 DIAGNOSIS — Z79899 Other long term (current) drug therapy: Secondary | ICD-10-CM | POA: Diagnosis not present

## 2020-06-01 DIAGNOSIS — E119 Type 2 diabetes mellitus without complications: Secondary | ICD-10-CM

## 2020-06-01 DIAGNOSIS — U071 COVID-19: Secondary | ICD-10-CM | POA: Diagnosis not present

## 2020-06-01 LAB — URINALYSIS, ROUTINE W REFLEX MICROSCOPIC
Bilirubin Urine: NEGATIVE
Glucose, UA: NEGATIVE mg/dL
Ketones, ur: NEGATIVE mg/dL
Leukocytes,Ua: NEGATIVE
Nitrite: NEGATIVE
Protein, ur: NEGATIVE mg/dL
Specific Gravity, Urine: 1.004 — ABNORMAL LOW (ref 1.005–1.030)
pH: 5 (ref 5.0–8.0)

## 2020-06-01 LAB — RESP PANEL BY RT-PCR (FLU A&B, COVID) ARPGX2
Influenza A by PCR: NEGATIVE
Influenza B by PCR: NEGATIVE
SARS Coronavirus 2 by RT PCR: POSITIVE — AB

## 2020-06-01 LAB — CBC WITH DIFFERENTIAL/PLATELET
Abs Immature Granulocytes: 0.01 10*3/uL (ref 0.00–0.07)
Basophils Absolute: 0 10*3/uL (ref 0.0–0.1)
Basophils Relative: 0 %
Eosinophils Absolute: 0 10*3/uL (ref 0.0–0.5)
Eosinophils Relative: 0 %
HCT: 31.5 % — ABNORMAL LOW (ref 36.0–46.0)
Hemoglobin: 11.2 g/dL — ABNORMAL LOW (ref 12.0–15.0)
Immature Granulocytes: 0 %
Lymphocytes Relative: 10 %
Lymphs Abs: 0.4 10*3/uL — ABNORMAL LOW (ref 0.7–4.0)
MCH: 31.4 pg (ref 26.0–34.0)
MCHC: 35.6 g/dL (ref 30.0–36.0)
MCV: 88.2 fL (ref 80.0–100.0)
Monocytes Absolute: 0.3 10*3/uL (ref 0.1–1.0)
Monocytes Relative: 8 %
Neutro Abs: 3.1 10*3/uL (ref 1.7–7.7)
Neutrophils Relative %: 82 %
Platelets: 102 10*3/uL — ABNORMAL LOW (ref 150–400)
RBC: 3.57 MIL/uL — ABNORMAL LOW (ref 3.87–5.11)
RDW: 11.6 % (ref 11.5–15.5)
WBC: 3.8 10*3/uL — ABNORMAL LOW (ref 4.0–10.5)
nRBC: 0 % (ref 0.0–0.2)

## 2020-06-01 LAB — COMPREHENSIVE METABOLIC PANEL
ALT: 48 U/L — ABNORMAL HIGH (ref 0–44)
AST: 97 U/L — ABNORMAL HIGH (ref 15–41)
Albumin: 3.1 g/dL — ABNORMAL LOW (ref 3.5–5.0)
Alkaline Phosphatase: 51 U/L (ref 38–126)
Anion gap: 8 (ref 5–15)
BUN: 35 mg/dL — ABNORMAL HIGH (ref 8–23)
CO2: 20 mmol/L — ABNORMAL LOW (ref 22–32)
Calcium: 7.9 mg/dL — ABNORMAL LOW (ref 8.9–10.3)
Chloride: 91 mmol/L — ABNORMAL LOW (ref 98–111)
Creatinine, Ser: 1.23 mg/dL — ABNORMAL HIGH (ref 0.44–1.00)
GFR, Estimated: 43 mL/min — ABNORMAL LOW (ref >60)
Glucose, Bld: 176 mg/dL — ABNORMAL HIGH (ref 70–99)
Potassium: 4.6 mmol/L (ref 3.5–5.1)
Sodium: 119 mmol/L — CL (ref 135–145)
Total Bilirubin: 0.6 mg/dL (ref 0.3–1.2)
Total Protein: 5.8 g/dL — ABNORMAL LOW (ref 6.5–8.1)

## 2020-06-01 LAB — C-REACTIVE PROTEIN: CRP: 3.2 mg/dL — ABNORMAL HIGH (ref <1.0)

## 2020-06-01 LAB — FIBRINOGEN: Fibrinogen: 416 mg/dL (ref 210–475)

## 2020-06-01 LAB — D-DIMER, QUANTITATIVE: D-Dimer, Quant: 2.61 ug/mL-FEU — ABNORMAL HIGH (ref 0.00–0.50)

## 2020-06-01 LAB — PROCALCITONIN: Procalcitonin: <0.1 ng/mL

## 2020-06-01 LAB — LACTATE DEHYDROGENASE: LDH: 333 U/L — ABNORMAL HIGH (ref 98–192)

## 2020-06-01 MED ORDER — DEXAMETHASONE SODIUM PHOSPHATE 10 MG/ML IJ SOLN
6.0000 mg | INTRAMUSCULAR | Status: DC
  Administered 2020-06-01: 6 mg via INTRAVENOUS
  Filled 2020-06-01: qty 1

## 2020-06-01 MED ORDER — METHYLPREDNISOLONE SODIUM SUCC 125 MG IJ SOLR: 125.0000 mg | Freq: Once | INTRAMUSCULAR | Status: DC | PRN

## 2020-06-01 MED ORDER — ALBUTEROL SULFATE HFA 108 (90 BASE) MCG/ACT IN AERS: 2.0000 | INHALATION_SPRAY | Freq: Once | RESPIRATORY_TRACT | Status: DC | PRN

## 2020-06-01 MED ORDER — FAMOTIDINE IN NACL 20-0.9 MG/50ML-% IV SOLN: 20.0000 mg | Freq: Once | INTRAVENOUS | Status: DC | PRN

## 2020-06-01 MED ORDER — EPINEPHRINE 0.3 MG/0.3ML IJ SOAJ: 0.3000 mg | Freq: Once | INTRAMUSCULAR | Status: DC | PRN

## 2020-06-01 MED ORDER — DIPHENHYDRAMINE HCL 50 MG/ML IJ SOLN: 50.0000 mg | Freq: Once | INTRAMUSCULAR | Status: DC | PRN

## 2020-06-01 MED ORDER — SODIUM CHLORIDE 0.9 % IV SOLN
1200.0000 mg | Freq: Once | INTRAVENOUS | Status: AC
  Administered 2020-06-02: 1200 mg via INTRAVENOUS
  Filled 2020-06-01 (×2): qty 10

## 2020-06-01 MED ORDER — SODIUM CHLORIDE 0.9 % IV SOLN: INTRAVENOUS | Status: DC | PRN

## 2020-06-01 MED ORDER — SODIUM CHLORIDE 0.9 % IV SOLN: 1200.0000 mg | Freq: Once | INTRAVENOUS | Status: DC

## 2020-06-01 MED ORDER — SODIUM CHLORIDE 0.9 % IV BOLUS
1000.0000 mL | Freq: Once | INTRAVENOUS | Status: AC
  Administered 2020-06-01: 1000 mL via INTRAVENOUS

## 2020-06-01 MED ORDER — SODIUM CHLORIDE 0.9 % IV SOLN: INTRAVENOUS | Status: DC

## 2020-06-01 NOTE — ED Provider Notes (Signed)
[facility] EMERGENCY DEPARTMENT Provider Note   CSN: [id number] Arrival date & time: 06/01/20  1744     History Chief Complaint  Patient presents with  . Weakness  . Covid Positive    Audrey Walker is a 86 y.o. female with a history of T2DM, hypertension, atrial flutter, and CKD who presents to the emergency department with complaints of generalized weakness and dehydration which has been progressively worsening.  Patient states that she has felt sick for a while now, she believes it has been more than 10 days, but she is unsure of the exact onset.  She states that she has felt fatigued, generally weak, has had diarrhea, and has had no appetite with minimal p.o. intake. She feels this has led to her being dehydrated, her tongue feels very dry, she feels lightheaded with position changes.  She was having some cough with has since improved and is almost resolved.  She was seen by her PCP for her complaints and was found to be Covid +5 days ago.  She has not received her COVID-19 vaccine.  She denies fever, chills, chest pain, dyspnea, hemoptysis, syncope, vomiting, melena, or hematochezia. She lives at home by herself.   HPI     Past Medical History:  Diagnosis Date  . Atrial flutter ([place])   . CKD (chronic kidney disease) stage 2, GFR 60-89 ml/min   . Essential hypertension, benign   . Type 2 diabetes mellitus [facility])     Patient Active Problem List   Diagnosis Date Noted  . Type 2 diabetes mellitus ([place]) 04/29/2013  . Atrial flutter ([place]) 04/29/2013  . Essential hypertension, benign 04/29/2013    History reviewed. No pertinent surgical history.   OB History   No obstetric history on file.     Family History  Problem Relation Age of Onset  . Hypertension Other   . Diabetes Mellitus II Other     Social History   Tobacco Use  . Smoking status: Never Smoker  . Smokeless tobacco: Never Used  Vaping Use  . Vaping Use: Never used  Substance Use Topics  . Alcohol use:  No  . Drug use: No    Home Medications Prior to Admission medications   Medication Sig Start Date End Date Taking? Authorizing Provider  acetaminophen (TYLENOL) 500 MG tablet Take 500 mg by mouth every 6 (six) hours as needed.    [provider]  Ascorbic Acid (VITAMIN C) 1000 MG tablet Take 2,000 mg by mouth daily.    [provider]  aspirin EC 81 MG tablet Take 81 mg by mouth daily as needed.     [provider]  atenolol (TENORMIN) 50 MG tablet Take 1 tablet (50 mg total) by mouth daily. 08/11/14   Satira Sark, MD  B-D ULTRAFINE III SHORT PEN 31G X 8 MM MISC Inject 1 Syringe into the skin daily. 09/30/19   [provider]  Blood Glucose Monitoring Suppl (ONE TOUCH ULTRA 2) w/Device KIT  07/02/19   [provider]  Cyanocobalamin (VITAMIN B 12 PO) Take 1 tablet by mouth daily.     [provider]  enalapril (VASOTEC) 20 MG tablet Take 20 mg by mouth daily.    [provider]  fish oil-omega-3 fatty acids 1000 MG capsule Take 1 g by mouth daily.    [provider]  insulin glargine (LANTUS) 100 UNIT/ML injection Inject 16 Units into the skin at bedtime.     [provider]  Allergies    Nsaids  Review of Systems   Review of Systems  Constitutional: Positive for fatigue. Negative for chills and fever.  HENT: Negative for congestion and ear pain.        Positive for dry mouth.  Respiratory: Positive for cough. Negative for shortness of breath.   Cardiovascular: Negative for chest pain and leg swelling.  Gastrointestinal: Positive for diarrhea. Negative for abdominal pain, anal bleeding, blood in stool, constipation and vomiting.  Genitourinary: Negative for dysuria, frequency and urgency.  Neurological: Positive for weakness (generalized) and light-headedness (w/ position changes). Negative for syncope.  All other systems reviewed and are negative.   Physical Exam Updated Vital Signs BP (!)  142/51 (BP Location: Right Arm)   Pulse 71   Temp 98.1 F (36.7 C) (Oral)   Resp 18   SpO2 94%   Physical Exam Vitals and nursing note reviewed.  Constitutional:      General: She is not in acute distress.    Appearance: She is well-developed. She is not toxic-appearing.  HENT:     Head: Normocephalic and atraumatic.     Mouth/Throat:     Mouth: Mucous membranes are dry.     Comments: Posterior oropharynx is symmetric appearing. Patient tolerating own secretions without difficulty. No trismus. No drooling. No hot potato voice. No swelling beneath the tongue, submandibular compartment is soft.  Eyes:     General:        Right eye: No discharge.        Left eye: No discharge.     Conjunctiva/sclera: Conjunctivae normal.  Cardiovascular:     Rate and Rhythm: Normal rate and regular rhythm.  Pulmonary:     Effort: Pulmonary effort is normal. No respiratory distress.     Breath sounds: Normal breath sounds. No wheezing, rhonchi or rales.  Abdominal:     General: There is no distension.     Palpations: Abdomen is soft.     Tenderness: There is no abdominal tenderness. There is no guarding or rebound.  Musculoskeletal:     Cervical back: Neck supple.  Skin:    General: Skin is warm and dry.     Findings: No rash.  Neurological:     Mental Status: She is alert.     Comments: Clear speech.   Psychiatric:        Behavior: Behavior normal.    ED Results / Procedures / Treatments   Labs (all labs ordered are listed, but only abnormal results are displayed) Labs Reviewed  COMPREHENSIVE METABOLIC PANEL - Abnormal; Notable for the following components:      Result Value   Sodium 119 (*)    Chloride 91 (*)    CO2 20 (*)    Glucose, Bld 176 (*)    BUN 35 (*)    Creatinine, Ser 1.23 (*)    Calcium 7.9 (*)    Total Protein 5.8 (*)    Albumin 3.1 (*)    AST 97 (*)    ALT 48 (*)    GFR, Estimated 43 (*)    All other components within normal limits  CBC WITH  DIFFERENTIAL/PLATELET - Abnormal; Notable for the following components:   WBC 3.8 (*)    RBC 3.57 (*)    Hemoglobin 11.2 (*)    HCT 31.5 (*)    Platelets 102 (*)    Lymphs Abs 0.4 (*)    All other components within normal limits  URINALYSIS, ROUTINE W REFLEX MICROSCOPIC - Abnormal;  Notable for the following components:   Color, Urine STRAW (*)    Specific Gravity, Urine 1.004 (*)    Hgb urine dipstick SMALL (*)    Bacteria, UA FEW (*)    All other components within normal limits  LACTATE DEHYDROGENASE - Abnormal; Notable for the following components:   LDH 333 (*)    All other components within normal limits  RESP PANEL BY RT-PCR (FLU A&B, COVID) ARPGX2  PROCALCITONIN  FERRITIN  D-DIMER, QUANTITATIVE (NOT AT [facility])  C-REACTIVE PROTEIN  FIBRINOGEN  OSMOLALITY  OSMOLALITY, URINE  SODIUM, URINE, RANDOM  TSH  HEMOGLOBIN A1C    EKG None  Radiology No results found.  Procedures .Critical Care Performed by: Amaryllis Dyke, PA-C Authorized by: Amaryllis Dyke, PA-C    CRITICAL CARE Performed by: Kennith Maes   Total critical care time: 35 minutes  Critical care time was exclusive of separately billable procedures and treating other patients.  Critical care was necessary to treat or prevent imminent or life-threatening deterioration.  Critical care was time spent personally by me on the following activities: development of treatment plan with patient and/or surrogate as well as nursing, discussions with consultants, evaluation of patient's response to treatment, examination of patient, obtaining history from patient or surrogate, ordering and performing treatments and interventions, ordering and review of laboratory studies, ordering and review of radiographic studies, pulse oximetry and re-evaluation of patient's condition.  (including critical care time)  Medications Ordered in ED Medications - No data to display  ED Course  I have reviewed the  triage vital signs and the nursing notes.  Pertinent labs & imaging results that were available during my care of the patient were reviewed by me and considered in my medical decision making (see chart for details).    Audrey Walker was evaluated in Emergency Department on 06/01/2020 for the symptoms described in the history of present illness. He/she was evaluated in the context of the global COVID-19 pandemic, which necessitated consideration that the patient might be at risk for infection with the SARS-CoV-2 virus that causes COVID-19. Institutional protocols and algorithms that pertain to the evaluation of patients at risk for COVID-19 are in a state of rapid change based on information released by regulatory bodies including the CDC and federal and state organizations. These policies and algorithms were followed during the patient's care in the ED.  MDM Rules/Calculators/A&P                          Patient who is known to be positive for COVID-19 presents to the emergency department with complaints of fatigue, generalized weakness, and concern for dehydration.  She is nontoxic, vitals have been with exception of elevated blood pressure, low suspicion for hypertensive emergency.  On exam she does not appear to be in respiratory distress, she is not hypoxic.  Her mucous membranes are extremely dry.  Plan for labs, 1 L of normal saline has been ordered.  Additional history obtained:  Additional history obtained from chart review & nursing note review. Previous records obtained and reviewed.   Lab Tests:  I Ordered, reviewed, and interpreted labs, which included:  CBC: Mild leukopenia likely related to COVID-19, anemia that is mildly worse from prior, however last labs on record from 2014 CMP: Significant hyponatremia with a sodium of 119.  Renal function fairly similar to prior.  Additional electrolyte abnormalities per above.  Mild elevation in LFTs likely secondary to COVID-19. UA: No  UTI.  Patient with significant hyponatremia- discussed w/ hospitalist Dr. Denton Brick who accepts admission.   Findings and plan of care discussed with supervising physician Dr. Roderic Palau who has evaluated patient as shared visit & is in agreement.   Portions of this note were generated with [employer]. Dictation errors may occur despite best attempts at proofreading.  Final Clinical Impression(s) / ED Diagnoses Final diagnoses:  Hyponatremia  Dehydration  Weakness  COVID-19    Rx / DC Orders ED Discharge Orders    None       Leafy Kindle 06/01/20 2105    Milton Ferguson, MD 06/02/20 1134

## 2020-06-01 NOTE — H&P (Addendum)
History and Physical    Audrey Walker BDZ:[id number] DOB: May 06, 1933 DOA: 06/01/2020  PCP: Celene Squibb, MD   Patient coming from: Home  I have personally briefly reviewed patient's old medical records in [place]  Chief Complaint: Weakness, Covid Positive  HPI: Audrey Walker is a 86 y.o. female with medical history significant for diabetes mellitus, atrial flutter, hypertension. Patient presented to the ED with complaints of generalized weakness, of 10 days duration.  She reports for the past several days she has not been unable to keep anything down, with multiple episodes of vomiting.  No abdominal pain.  She also reports diarrhea, but this has improved, she has had one stool today.  She feels dehydrated with dry mouth.  She reports a cough also.  She reports lightheadedness.  She denies difficulty breathing.  No chest pain.  Tested as an outpatient for Covid and was positive.  Patient is not vaccinated.  ED Course: Temperature 98.1.  Heart rate 64, blood pressure systolic 341 962.  O2 sats-94 200% on room air.  Sodium 119.  Stable creatinine 1.23.  EKG showed atrial fibrillation rate 71.  1 L bolus normal saline given.  Hospitalist to admit for hyponatremia dehydration and COVID-19 infection.  Review of Systems: As per HPI all other systems reviewed and negative.  Past Medical History:  Diagnosis Date  . Atrial flutter ([place])   . CKD (chronic kidney disease) stage 2, GFR 60-89 ml/min   . Essential hypertension, benign   . Type 2 diabetes mellitus ([place])     History reviewed. No pertinent surgical history.   reports that she has never smoked. She has never used smokeless tobacco. She reports that she does not drink alcohol and does not use drugs.  Allergies  Allergen Reactions  . Nsaids Other (See Comments)    Per nephrologist    Family History  Problem Relation Age of Onset  . Hypertension Other   . Diabetes Mellitus II Other     Prior to Admission medications    Medication Sig Start Date End Date Taking? Authorizing Provider  acetaminophen (TYLENOL) 500 MG tablet Take 500 mg by mouth every 6 (six) hours as needed.    [provider]  amoxicillin-clavulanate (AUGMENTIN) 875-125 MG tablet Take 1 tablet by mouth 2 (two) times daily. 05/26/20   [provider]  Ascorbic Acid (VITAMIN C) 1000 MG tablet Take 2,000 mg by mouth daily.    [provider]  aspirin EC 81 MG tablet Take 81 mg by mouth daily as needed.     [provider]  atenolol (TENORMIN) 50 MG tablet Take 1 tablet (50 mg total) by mouth daily. 08/11/14   Satira Sark, MD  B-D ULTRAFINE III SHORT PEN 31G X 8 MM MISC Inject 1 Syringe into the skin daily. 09/30/19   [provider]  benzonatate (TESSALON) 100 MG capsule Take 100 mg by mouth 3 (three) times daily. 05/29/20   [provider]  Blood Glucose Monitoring Suppl (ONE TOUCH ULTRA 2) w/Device KIT  07/02/19   [provider]  Cyanocobalamin (VITAMIN B 12 PO) Take 1 tablet by mouth daily.     [provider]  enalapril (VASOTEC) 20 MG tablet Take 20 mg by mouth daily.    [provider]  fish oil-omega-3 fatty acids 1000 MG capsule Take 1 g by mouth daily.    [provider]  insulin glargine (LANTUS) 100 UNIT/ML injection Inject 16 Units into the skin at bedtime.  [provider]  prednisoLONE acetate (PRED FORTE) 1 % ophthalmic suspension SMARTSIG:In Eye(s) 06/01/20   [provider]  TRESIBA FLEXTOUCH 100 UNIT/ML FlexTouch Pen Inject into the skin. 02/25/20   [provider]    Physical Exam: Vitals:   06/01/20 1756 06/01/20 1809 06/01/20 1831 06/01/20 1833  BP: (!) 142/51  (!) 132/58 (!) 125/51  Pulse: 71 64 64 64  Resp: 18     Temp: 98.1 F (36.7 C)     TempSrc: Oral     SpO2: 94% 98% 97% 100%    Constitutional: NAD, calm, comfortable, hard of hearing Vitals:   06/01/20 1756 06/01/20 1809 06/01/20 1831  06/01/20 1833  BP: (!) 142/51  (!) 132/58 (!) 125/51  Pulse: 71 64 64 64  Resp: 18     Temp: 98.1 F (36.7 C)     TempSrc: Oral     SpO2: 94% 98% 97% 100%   Eyes: PERRL, lids and conjunctivae normal ENMT: Mucous membranes are dry Neck: normal, supple, no masses, no thyromegaly Respiratory:  Normal respiratory effort. No accessory muscle use.  Cardiovascular: Irregular rate and rhythm, no murmurs / rubs / gallops. Trace bilateral extremity edema. 2+ pedal pulses.  Abdomen: no tenderness, no masses palpated. No hepatosplenomegaly. Bowel sounds positive.  Musculoskeletal: no clubbing / cyanosis. No joint deformity upper and lower extremities. Good ROM, no contractures. Normal muscle tone.  Skin: no rashes, lesions, ulcers. No induration Neurologic: No apparent cranial abnormality, moving extremities spontaneously. Psychiatric: Normal judgment and insight. Alert and oriented x 3. Normal mood.   Labs on Admission: I have personally reviewed following labs and imaging studies  CBC: Recent Labs  Lab 06/01/20 1832  WBC 3.8*  NEUTROABS 3.1  HGB 11.2*  HCT 31.5*  MCV 88.2  PLT 702*   Basic Metabolic Panel: Recent Labs  Lab 06/01/20 1832  NA 119*  K 4.6  CL 91*  CO2 20*  GLUCOSE 176*  BUN 35*  CREATININE 1.23*  CALCIUM 7.9*   Liver Function Tests: Recent Labs  Lab 06/01/20 1832  AST 97*  ALT 48*  ALKPHOS 51  BILITOT 0.6  PROT 5.8*  ALBUMIN 3.1*    Radiological Exams on Admission: No results found.  EKG: Independently reviewed.  Atrial fibrillation rate 71  Assessment/Plan Principal Problem:   Hyponatremia Active Problems:   Type 2 diabetes mellitus (H[facility])   Atrial flutter (H[facility])   Essential hypertension, benign   Hypovolemic hyponatremia- sodium 119, last sodium checked 7 years ago was in the 140s.  Hyponatremia likely secondary to diarrhea, poor oral intake from COVID-19 infection. -Obtain serum osmolality, urine osmolality -Obtain urine sodium -Check  TSH -1 L bolus given, continue N/s 75cc/hr - Monitor sodium closely x 2 -Goal correction 8 mEq in 24 hours.  COVID-19 infection-reports cough, denies dyspnea.  O2 sats 94 - 100% on room air.  Not vaccinated. Portable chest x-ray-suggesting pulmonary vascular congestion, chronic bronchitic changes.  No airspace or interstitial opacities. -Monoclonal antibody infusion ordered -Obtain a trend inflammatory markers -COVID-19 admission protocol -Dexamethasone 6 mg daily -Multivitamins  Thrombocytopenia-platelets 102 likely due to acute viral infection.  Platelets 7 years ago was 180. - CBC a.m.  Diabetes mellitus-random glucose 176, - HgbA1c - SSI- M -Monitor glucose while on steroids -Resume home Tresiba 12 units nightly,   Atrial flutter/fibrillation-rate controlled, heart rate mostly 60s.  Follows with Dr. Domenic Polite.  Not on anticoagulation-patient declines.  She is on daily aspirin. -Resume aspirin 81 mg daily - Resume Atenolol 50 mg  daily  Hypertension- Stable. - Resume Enalapril, atenolol.   DVT prophylaxis: Lovenox Code Status: Full code. Family Communication: None at bedside Disposition Plan:  ~ 2 days, pending improvement in hyponatremia and stable respiratory status. Consults called: None Admission status: Inpt, tele I certify that at the point of admission it is my clinical judgment that the patient will require inpatient hospital care spanning beyond 2 midnights from the point of admission due to high intensity of service, high risk for further deterioration and high frequency of surveillance required. The following factors support the patient status of inpatient: Hyponatremia and COVID-19 infection requiring close monitoring while on IV fluids.   Bethena Roys MD [facility]  06/01/2020, 8:53 PM

## 2020-06-01 NOTE — ED Notes (Signed)
Tested positive for covid 5 days ago. Advised by PCP to come to the ED

## 2020-06-01 NOTE — ED Notes (Signed)
Date and time results received: 06/01/20 1955 (use smartphrase ".now" to insert current time)  Test: sodium  Critical Value: 119  Name of Provider Notified: Petrucelli, Wilder  Orders Received? Or Actions Taken?: Actions Taken: no orders received

## 2020-06-02 ENCOUNTER — Other Ambulatory Visit: Payer: Self-pay

## 2020-06-02 DIAGNOSIS — E860 Dehydration: Secondary | ICD-10-CM

## 2020-06-02 DIAGNOSIS — I10 Essential (primary) hypertension: Secondary | ICD-10-CM

## 2020-06-02 DIAGNOSIS — R531 Weakness: Secondary | ICD-10-CM

## 2020-06-02 LAB — CBC WITH DIFFERENTIAL/PLATELET
Abs Immature Granulocytes: 0.01 10*3/uL (ref 0.00–0.07)
Basophils Absolute: 0 10*3/uL (ref 0.0–0.1)
Basophils Relative: 0 %
Eosinophils Absolute: 0 10*3/uL (ref 0.0–0.5)
Eosinophils Relative: 0 %
HCT: 31.4 % — ABNORMAL LOW (ref 36.0–46.0)
Hemoglobin: 10.9 g/dL — ABNORMAL LOW (ref 12.0–15.0)
Immature Granulocytes: 0 %
Lymphocytes Relative: 12 %
Lymphs Abs: 0.3 10*3/uL — ABNORMAL LOW (ref 0.7–4.0)
MCH: 31.1 pg (ref 26.0–34.0)
MCHC: 34.7 g/dL (ref 30.0–36.0)
MCV: 89.7 fL (ref 80.0–100.0)
Monocytes Absolute: 0.1 10*3/uL (ref 0.1–1.0)
Monocytes Relative: 4 %
Neutro Abs: 2 10*3/uL (ref 1.7–7.7)
Neutrophils Relative %: 84 %
Platelets: 101 10*3/uL — ABNORMAL LOW (ref 150–400)
RBC: 3.5 MIL/uL — ABNORMAL LOW (ref 3.87–5.11)
RDW: 11.8 % (ref 11.5–15.5)
WBC: 2.4 10*3/uL — ABNORMAL LOW (ref 4.0–10.5)
nRBC: 0 % (ref 0.0–0.2)

## 2020-06-02 LAB — BASIC METABOLIC PANEL
Anion gap: 7 (ref 5–15)
Anion gap: 8 (ref 5–15)
Anion gap: 7 (ref 5–15)
Anion gap: 8 (ref 5–15)
BUN: 28 mg/dL — ABNORMAL HIGH (ref 8–23)
BUN: 27 mg/dL — ABNORMAL HIGH (ref 8–23)
BUN: 31 mg/dL — ABNORMAL HIGH (ref 8–23)
BUN: 28 mg/dL — ABNORMAL HIGH (ref 8–23)
CO2: 22 mmol/L (ref 22–32)
CO2: 22 mmol/L (ref 22–32)
CO2: 20 mmol/L — ABNORMAL LOW (ref 22–32)
CO2: 20 mmol/L — ABNORMAL LOW (ref 22–32)
Calcium: 8 mg/dL — ABNORMAL LOW (ref 8.9–10.3)
Calcium: 8.2 mg/dL — ABNORMAL LOW (ref 8.9–10.3)
Calcium: 7.8 mg/dL — ABNORMAL LOW (ref 8.9–10.3)
Calcium: 8.4 mg/dL — ABNORMAL LOW (ref 8.9–10.3)
Chloride: 98 mmol/L (ref 98–111)
Chloride: 98 mmol/L (ref 98–111)
Chloride: 95 mmol/L — ABNORMAL LOW (ref 98–111)
Chloride: 102 mmol/L (ref 98–111)
Creatinine, Ser: 1.08 mg/dL — ABNORMAL HIGH (ref 0.44–1.00)
Creatinine, Ser: 1.02 mg/dL — ABNORMAL HIGH (ref 0.44–1.00)
Creatinine, Ser: 1.07 mg/dL — ABNORMAL HIGH (ref 0.44–1.00)
Creatinine, Ser: 1.04 mg/dL — ABNORMAL HIGH (ref 0.44–1.00)
GFR, Estimated: 50 mL/min — ABNORMAL LOW (ref >60)
GFR, Estimated: 54 mL/min — ABNORMAL LOW (ref >60)
GFR, Estimated: 51 mL/min — ABNORMAL LOW (ref >60)
GFR, Estimated: 53 mL/min — ABNORMAL LOW (ref >60)
Glucose, Bld: 212 mg/dL — ABNORMAL HIGH (ref 70–99)
Glucose, Bld: 188 mg/dL — ABNORMAL HIGH (ref 70–99)
Glucose, Bld: 130 mg/dL — ABNORMAL HIGH (ref 70–99)
Glucose, Bld: 175 mg/dL — ABNORMAL HIGH (ref 70–99)
Potassium: 5.4 mmol/L — ABNORMAL HIGH (ref 3.5–5.1)
Potassium: 5.2 mmol/L — ABNORMAL HIGH (ref 3.5–5.1)
Potassium: 4.9 mmol/L (ref 3.5–5.1)
Potassium: 5.1 mmol/L (ref 3.5–5.1)
Sodium: 127 mmol/L — ABNORMAL LOW (ref 135–145)
Sodium: 128 mmol/L — ABNORMAL LOW (ref 135–145)
Sodium: 122 mmol/L — ABNORMAL LOW (ref 135–145)
Sodium: 130 mmol/L — ABNORMAL LOW (ref 135–145)

## 2020-06-02 LAB — COMPREHENSIVE METABOLIC PANEL
ALT: 42 U/L (ref 0–44)
AST: 82 U/L — ABNORMAL HIGH (ref 15–41)
Albumin: 2.8 g/dL — ABNORMAL LOW (ref 3.5–5.0)
Alkaline Phosphatase: 45 U/L (ref 38–126)
Anion gap: 7 (ref 5–15)
BUN: 29 mg/dL — ABNORMAL HIGH (ref 8–23)
CO2: 22 mmol/L (ref 22–32)
Calcium: 7.9 mg/dL — ABNORMAL LOW (ref 8.9–10.3)
Chloride: 97 mmol/L — ABNORMAL LOW (ref 98–111)
Creatinine, Ser: 1 mg/dL (ref 0.44–1.00)
GFR, Estimated: 55 mL/min — ABNORMAL LOW (ref >60)
Glucose, Bld: 186 mg/dL — ABNORMAL HIGH (ref 70–99)
Potassium: 5.2 mmol/L — ABNORMAL HIGH (ref 3.5–5.1)
Sodium: 126 mmol/L — ABNORMAL LOW (ref 135–145)
Total Bilirubin: 0.5 mg/dL (ref 0.3–1.2)
Total Protein: 5.4 g/dL — ABNORMAL LOW (ref 6.5–8.1)

## 2020-06-02 LAB — GLUCOSE, CAPILLARY
Glucose-Capillary: 190 mg/dL — ABNORMAL HIGH (ref 70–99)
Glucose-Capillary: 230 mg/dL — ABNORMAL HIGH (ref 70–99)
Glucose-Capillary: 228 mg/dL — ABNORMAL HIGH (ref 70–99)
Glucose-Capillary: 153 mg/dL — ABNORMAL HIGH (ref 70–99)

## 2020-06-02 LAB — PHOSPHORUS: Phosphorus: 3.1 mg/dL (ref 2.5–4.6)

## 2020-06-02 LAB — FERRITIN
Ferritin: 714 ng/mL — ABNORMAL HIGH (ref 11–307)
Ferritin: 742 ng/mL — ABNORMAL HIGH (ref 11–307)

## 2020-06-02 LAB — C-REACTIVE PROTEIN: CRP: 3.4 mg/dL — ABNORMAL HIGH (ref <1.0)

## 2020-06-02 LAB — MAGNESIUM: Magnesium: 1.5 mg/dL — ABNORMAL LOW (ref 1.7–2.4)

## 2020-06-02 LAB — TSH: TSH: 0.768 u[IU]/mL (ref 0.350–4.500)

## 2020-06-02 LAB — HEMOGLOBIN A1C
Hgb A1c MFr Bld: 6.5 % — ABNORMAL HIGH (ref 4.8–5.6)
Mean Plasma Glucose: 139.85 mg/dL

## 2020-06-02 LAB — OSMOLALITY: Osmolality: 274 mOsm/kg — ABNORMAL LOW (ref 275–295)

## 2020-06-02 LAB — D-DIMER, QUANTITATIVE: D-Dimer, Quant: 2.41 ug/mL-FEU — ABNORMAL HIGH (ref 0.00–0.50)

## 2020-06-02 MED ORDER — INSULIN GLARGINE 100 UNIT/ML ~~LOC~~ SOLN
12.0000 [IU] | Freq: Every day | SUBCUTANEOUS | Status: DC
  Administered 2020-06-02: 12 [IU] via SUBCUTANEOUS
  Filled 2020-06-02 (×2): qty 0.12

## 2020-06-02 MED ORDER — INSULIN ASPART 100 UNIT/ML ~~LOC~~ SOLN
0.0000 [IU] | Freq: Three times a day (TID) | SUBCUTANEOUS | Status: DC
  Administered 2020-06-02: 3 [IU] via SUBCUTANEOUS
  Administered 2020-06-02 (×2): 5 [IU] via SUBCUTANEOUS
  Administered 2020-06-03: 3 [IU] via SUBCUTANEOUS
  Administered 2020-06-03: 2 [IU] via SUBCUTANEOUS

## 2020-06-02 MED ORDER — ATENOLOL 25 MG PO TABS
50.0000 mg | ORAL_TABLET | Freq: Every day | ORAL | Status: DC
  Administered 2020-06-02 – 2020-06-03 (×2): 50 mg via ORAL
  Filled 2020-06-02 (×2): qty 2

## 2020-06-02 MED ORDER — ZINC SULFATE 220 (50 ZN) MG PO CAPS
220.0000 mg | ORAL_CAPSULE | Freq: Every day | ORAL | Status: DC
  Administered 2020-06-02 – 2020-06-03 (×2): 220 mg via ORAL
  Filled 2020-06-02 (×2): qty 1

## 2020-06-02 MED ORDER — ACETAMINOPHEN 325 MG PO TABS
650.0000 mg | ORAL_TABLET | Freq: Four times a day (QID) | ORAL | Status: DC | PRN
  Administered 2020-06-02 – 2020-06-03 (×2): 650 mg via ORAL
  Filled 2020-06-02 (×2): qty 2

## 2020-06-02 MED ORDER — ASCORBIC ACID 500 MG PO TABS
500.0000 mg | ORAL_TABLET | Freq: Every day | ORAL | Status: DC
  Administered 2020-06-02 – 2020-06-03 (×2): 500 mg via ORAL
  Filled 2020-06-02 (×2): qty 1

## 2020-06-02 MED ORDER — DEXAMETHASONE 4 MG PO TABS
6.0000 mg | ORAL_TABLET | Freq: Every day | ORAL | Status: DC
  Administered 2020-06-02 – 2020-06-03 (×2): 6 mg via ORAL
  Filled 2020-06-02 (×2): qty 2

## 2020-06-02 MED ORDER — ONDANSETRON HCL 4 MG PO TABS: 4.0000 mg | ORAL_TABLET | Freq: Four times a day (QID) | ORAL | Status: DC | PRN

## 2020-06-02 MED ORDER — GUAIFENESIN-DM 100-10 MG/5ML PO SYRP
10.0000 mL | ORAL_SOLUTION | Freq: Three times a day (TID) | ORAL | Status: AC
  Administered 2020-06-02 – 2020-06-03 (×4): 10 mL via ORAL
  Filled 2020-06-02 (×4): qty 10

## 2020-06-02 MED ORDER — SODIUM CHLORIDE 0.9 % IV SOLN: INTRAVENOUS | Status: DC

## 2020-06-02 MED ORDER — PANTOPRAZOLE SODIUM 40 MG PO TBEC
40.0000 mg | DELAYED_RELEASE_TABLET | Freq: Every day | ORAL | Status: DC
  Administered 2020-06-02 – 2020-06-03 (×2): 40 mg via ORAL
  Filled 2020-06-02 (×2): qty 1

## 2020-06-02 MED ORDER — ENALAPRIL MALEATE 5 MG PO TABS
20.0000 mg | ORAL_TABLET | Freq: Every day | ORAL | Status: DC
  Administered 2020-06-02 – 2020-06-03 (×2): 20 mg via ORAL
  Filled 2020-06-02 (×2): qty 4

## 2020-06-02 MED ORDER — ENOXAPARIN SODIUM 40 MG/0.4ML ~~LOC~~ SOLN
40.0000 mg | SUBCUTANEOUS | Status: DC
  Administered 2020-06-02 – 2020-06-03 (×2): 40 mg via SUBCUTANEOUS
  Filled 2020-06-02 (×2): qty 0.4

## 2020-06-02 MED ORDER — ASPIRIN EC 81 MG PO TBEC
81.0000 mg | DELAYED_RELEASE_TABLET | Freq: Every day | ORAL | Status: DC
  Administered 2020-06-02 – 2020-06-03 (×2): 81 mg via ORAL
  Filled 2020-06-02 (×2): qty 1

## 2020-06-02 MED ORDER — ONDANSETRON HCL 4 MG/2ML IJ SOLN: 4.0000 mg | Freq: Four times a day (QID) | INTRAMUSCULAR | Status: DC | PRN

## 2020-06-02 MED ORDER — MAGNESIUM SULFATE 2 GM/50ML IV SOLN
2.0000 g | Freq: Once | INTRAVENOUS | Status: AC
  Administered 2020-06-02: 2 g via INTRAVENOUS
  Filled 2020-06-02: qty 50

## 2020-06-02 MED ORDER — INSULIN ASPART 100 UNIT/ML ~~LOC~~ SOLN: 0.0000 [IU] | Freq: Every day | SUBCUTANEOUS | Status: DC

## 2020-06-02 MED ORDER — INSULIN DEGLUDEC 100 UNIT/ML ~~LOC~~ SOPN: 12.0000 [IU] | PEN_INJECTOR | Freq: Every day | SUBCUTANEOUS | Status: DC

## 2020-06-02 MED ORDER — POLYETHYLENE GLYCOL 3350 17 G PO PACK: 17.0000 g | PACK | Freq: Every day | ORAL | Status: DC | PRN

## 2020-06-02 NOTE — Progress Notes (Signed)
Patient was admitted yesterday due to hypovolemic hyponatremia with sodium of 119, she was started on IV hydration.  Follow-up CMP showed sodium of 126 (an increase of 7 mmol/L within 9 hours; goal was 8 in 24 hours).  IV hydration was stopped at this time due to rapid correction.  Plan will be to restart the IV hydration later in the day at a lower rate and with more frequent BMP. Magnesium was low at 1.5, this was replenished.

## 2020-06-02 NOTE — Plan of Care (Signed)
  Problem: Acute Rehab PT Goals(only PT should resolve) Goal: Pt Will Go Supine/Side To Sit Outcome: Progressing Flowsheets (Taken 06/02/2020 1407) Pt will go Supine/Side to Sit: with modified independence Goal: Patient Will Transfer Sit To/From Stand Outcome: Progressing Flowsheets (Taken 06/02/2020 1407) Patient will transfer sit to/from stand:  with supervision  with min guard assist Goal: Pt Will Transfer Bed To Chair/Chair To Bed Outcome: Progressing Flowsheets (Taken 06/02/2020 1407) Pt will Transfer Bed to Chair/Chair to Bed:  with supervision  min guard assist Goal: Pt Will Ambulate Outcome: Progressing Flowsheets (Taken 06/02/2020 1407) Pt will Ambulate:  75 feet  with supervision  with rolling walker   2:08 PM, 06/02/20 Lonell Grandchild, MPT Physical Therapist with [facility] [phone] office [phone] mobile phone

## 2020-06-02 NOTE — TOC Initial Note (Signed)
Transition of Care [facility]) - Initial/Assessment Note    Patient Details  Name: Audrey Walker MRN: [id number] Date of Birth: 1933/02/02  Transition of Care [facility]) CM/SW Contact:    Ihor Gully, LCSW Phone Number: 06/02/2020, 2:56 PM  Clinical Narrative:                 Patient from home alone. Independent at baseline, uses cane most often. Has walker, w/c, handicapp commode. Drives but not very much. Discussed PT recommendation for SNF with patient and daughter. Discussed facilities accepting COVID+ patients. Daughter to speak with mother and make decision about HH vs SNF and return call to [facility].  Expected Discharge Plan: [place] Barriers to Discharge: Continued Medical Work up   Patient Goals and CMS Choice        Expected Discharge Plan and Services Expected Discharge Plan: [place] Acute Care Choice: [place] arrangements for the past 2 months: Single Family Home                                      Prior Living Arrangements/Services Living arrangements for the past 2 months: Single Family Home Lives with:: Self Patient language and need for interpreter reviewed:: Yes Do you feel safe going back to the place where you live?: Yes      Need for Family Participation in Patient Care: Yes (Comment) Care giver support system in place?: Yes (comment)   Criminal Activity/Legal Involvement Pertinent to Current Situation/Hospitalization: No - Comment as needed  Activities of Daily Living Home Assistive Devices/Equipment: Walker (specify type) ADL Screening (condition at time of admission) Patient's cognitive ability adequate to safely complete daily activities?: Yes Is the patient deaf or have difficulty hearing?: No Does the patient have difficulty seeing, even when wearing glasses/contacts?: No Does the patient have difficulty concentrating, remembering, or making decisions?: No Patient able to express  need for assistance with ADLs?: Yes Does the patient have difficulty dressing or bathing?: No Independently performs ADLs?: Yes (appropriate for developmental age) Does the patient have difficulty walking or climbing stairs?: Yes Weakness of Legs: None Weakness of Arms/Hands: None  Permission Sought/Granted Permission sought to share information with : Family Supports Permission granted to share information with : Yes, Verbal Permission Granted  Share Information with NAME: Clois Comber     Permission granted to share info w Relationship: daughter     Emotional Assessment Appearance:: Appears stated age   Affect (typically observed): Appropriate Orientation: : Oriented to Self,Oriented to Place,Oriented to  Time,Oriented to Situation Alcohol / Substance Use: Not Applicable Psych Involvement: No (comment)  Admission diagnosis:  Dehydration [E86.0] Hyponatremia [E87.1] Weakness [R53.1] COVID-19 virus infection [U07.1] COVID-19 [U07.1] Patient Active Problem List   Diagnosis Date Noted   Hyponatremia 06/01/2020   COVID-19 virus infection 06/01/2020   Type 2 diabetes mellitus ([place]) 04/29/2013   Atrial flutter ([place]) 04/29/2013   Essential hypertension, benign 04/29/2013   PCP:  Celene Squibb, MD Pharmacy:   [facility]) [place], [place] [address] Phone: [phone] Fax: [phone]  [facility] [address] [place] [address] Phone: [phone] Fax: [phone]     Social Determinants of Health (SDOH) Interventions    Readmission Risk Interventions No flowsheet  data found.

## 2020-06-02 NOTE — Progress Notes (Signed)
Pharmacy COVID-19 Monoclonal Antibody Screening  Audrey Walker was identified as being not hospitalized with symptoms from Covid-19 on admission but an incidental positive PCR has been documented.  The patient may qualify for the use of monoclonal antibodies (mAB) for COVID-19 viral infection to prevent worsening symptoms stemming from Covid-19 infection.  The patient was identified based on a positive COVID-19 PCR and not requiring the use of supplemental oxygen at this time.  This patient meets the FDA criteria for Emergency Use Authorization of casirivimab/imdevimab or bamlanivimab/etesevimab.  Has a (+) direct SARS-CoV-2 viral test result  Is NOT hospitalized due to COVID-19 (hospitalized for hyponatremia)  Is within 10 days of symptom onset  Has at least one of the high risk factor(s) for progression to severe COVID-19 and/or hospitalization as defined in EUA.  Specific high risk criteria : Older age (>/= 84 yo)  Additionally: The patient has not had a positive COVID-19 PCR in the last 90 days.  The patient is unvaccinated against COVID-19.  Since the patient is unvaccinated and meets high risk criteria, the patient is eligible for mAB administration.   This eligibility and indication for treatment was discussed with the patient's physician: Dr Denton Brick  Plan: Based on the above discussion, it was decided that the patient will receive one dose of the available COVID-19 mAB combination. Pharmacy will coordinate administration timing with patient's nurse. Recommended infusion monitoring parameters communicated to the nursing team.   Netta Cedars PharmD 06/02/2020  5:50 AM

## 2020-06-02 NOTE — Evaluation (Signed)
Physical Therapy Evaluation Patient Details Name: Audrey Walker MRN: [id number] DOB: Jul 06, 1932 Today's Date: 06/02/2020   History of Present Illness  Audrey Walker is a 86 y.o. female with medical history significant for diabetes mellitus, atrial flutter, hypertension.  Patient presented to the ED with complaints of generalized weakness, of 10 days duration.  She reports for the past several days she has not been unable to keep anything down, with multiple episodes of vomiting.  No abdominal pain.  She also reports diarrhea, but this has improved, she has had one stool today.  She feels dehydrated with dry mouth.  She reports a cough also.  She reports lightheadedness.  She denies difficulty breathing.  No chest pain.   Tested as an outpatient for Covid and was positive.  Patient is not vaccinated.    Clinical Impression  Patient demonstrates slow labored movement for sitting up at bedside, unable to maintain standing balance with near fall when not using AD, required use of RW for safe transfers and ambulation in room demonstrating slow labored cadence without loss of balance, limited mostly due to fatigue and generalized weakness.  Patient tolerated sitting up in chair to eat lunch after therapy - RN notified.  Patient will benefit from continued physical therapy in hospital and recommended venue below to increase strength, balance, endurance for safe ADLs and gait.      Follow Up Recommendations SNF;Supervision for mobility/OOB;Supervision - Intermittent    Equipment Recommendations  None recommended by PT    Recommendations for Other Services       Precautions / Restrictions Precautions Precautions: Fall Restrictions Weight Bearing Restrictions: No      Mobility  Bed Mobility Overal bed mobility: Needs Assistance Bed Mobility: Supine to Sit     Supine to sit: Supervision;Min guard     General bed mobility comments: increased time, labored movement    Transfers Overall  transfer level: Needs assistance Equipment used: Rolling walker (2 wheeled);1 person hand held assist;None Transfers: Sit to/from [facility] Sit to Stand: Min assist Stand pivot transfers: Min assist       General transfer comment: very unsteady on feet having to lean on nearby objects without AD, required the use of RW for safety  Ambulation/Gait Ambulation/Gait assistance: Min guard;Min assist Gait Distance (Feet): 40 Feet Assistive device: Rolling walker (2 wheeled) Gait Pattern/deviations: Decreased step length - left;Decreased stance time - right;Decreased stride length Gait velocity: decreased   General Gait Details: slow labored cadence without loss of balance, increased time making turns, limited secondary to fatigue  Stairs            Wheelchair Mobility    Modified Rankin (Stroke Patients Only)       Balance Overall balance assessment: Needs assistance Sitting-balance support: Feet supported;No upper extremity supported Sitting balance-Leahy Scale: Good Sitting balance - Comments: seated at EOB   Standing balance support: During functional activity;No upper extremity supported Standing balance-Leahy Scale: Poor Standing balance comment: fair using RW                             Pertinent Vitals/Pain Pain Assessment: No/denies pain    Home Living Family/patient expects to be discharged to:: Private residence Living Arrangements: Alone Available Help at Discharge: Family;Available PRN/intermittently Type of Home: House Home Access: Stairs to enter Entrance Stairs-Rails: None Entrance Stairs-Number of Steps: 1 Home Layout: Laundry or work area in basement;Able to live on main level with bedroom/bathroom;Two level Home  Equipment: Gilford Rile - 2 wheels;Wheelchair - manual;Bedside commode;Cane - single point;Grab bars - tub/shower      Prior Function Level of Independence: Independent with assistive device(s)          Comments: recently having to use RW for household ambulation, usually able to ambulate household distances without AD     Hand Dominance        Extremity/Trunk Assessment   Upper Extremity Assessment Upper Extremity Assessment: Generalized weakness    Lower Extremity Assessment Lower Extremity Assessment: Generalized weakness    Cervical / Trunk Assessment Cervical / Trunk Assessment: Normal  Communication   Communication: No difficulties  Cognition Arousal/Alertness: Awake/alert Behavior During Therapy: WFL for tasks assessed/performed Overall Cognitive Status: Within Functional Limits for tasks assessed                                        General Comments      Exercises     Assessment/Plan    PT Assessment Patient needs continued PT services  PT Problem List Decreased strength;Decreased activity tolerance;Decreased balance;Decreased mobility       PT Treatment Interventions Balance training;DME instruction;Gait training;Stair training;Functional mobility training;Therapeutic activities;Therapeutic exercise;Patient/family education    PT Goals (Current goals can be found in the Care Plan section)  Acute Rehab PT Goals Patient Stated Goal: return home with family to assist PT Goal Formulation: With patient Time For Goal Achievement: 06/16/20 Potential to Achieve Goals: Good    Frequency Min 3X/week   Barriers to discharge        Co-evaluation               AM-PAC PT "6 Clicks" Mobility  Outcome Measure Help needed turning from your back to your side while in a flat bed without using bedrails?: None Help needed moving from lying on your back to sitting on the side of a flat bed without using bedrails?: A Little Help needed moving to and from a bed to a chair (including a wheelchair)?: A Little Help needed standing up from a chair using your arms (e.g., wheelchair or bedside chair)?: A Little Help needed to walk in hospital  room?: A Little Help needed climbing 3-5 steps with a railing? : A Lot 6 Click Score: 18    End of Session   Activity Tolerance: Patient tolerated treatment well;Patient limited by fatigue Patient left: in chair;with call bell/phone within reach Nurse Communication: Mobility status PT Visit Diagnosis: Unsteadiness on feet (R26.81);Other abnormalities of gait and mobility (R26.89);Muscle weakness (generalized) (M62.81)    Time: 5035-4656 PT Time Calculation (min) (ACUTE ONLY): 29 min   Charges:   PT Evaluation $PT Eval Moderate Complexity: 1 Mod PT Treatments $Therapeutic Activity: 23-37 mins        2:05 PM, 06/02/20 Lonell Grandchild, MPT Physical Therapist with [facility] [phone] office [phone] mobile phone

## 2020-06-02 NOTE — Progress Notes (Signed)
PROGRESS NOTE    Audrey Walker  [id number] DOB: December 21, 1932 DOA: 06/01/2020 PCP: Celene Squibb, MD   Chief Complaint  Patient presents with  . Weakness  . Covid Positive    Brief Narrative:  As per H&P written by Dr. Denton Brick on 06/01/2020 Audrey Walker is a 86 y.o. female with medical history significant for diabetes mellitus, atrial flutter, hypertension. Patient presented to the ED with complaints of generalized weakness, of 10 days duration.  She reports for the past several days she has not been unable to keep anything down, with multiple episodes of vomiting.  No abdominal pain.  She also reports diarrhea, but this has improved, she has had one stool today.  She feels dehydrated with dry mouth.  She reports a cough also.  She reports lightheadedness.  She denies difficulty breathing.  No chest pain.  Tested as an outpatient for Covid and was positive.  Patient is not vaccinated.  ED Course: Temperature 98.1.  Heart rate 64, blood pressure systolic [id number].  O2 sats-94 200% on room air.  Sodium 119.  Stable creatinine 1.23.  EKG showed atrial fibrillation rate 71.  1 L bolus normal saline given.  Hospitalist to admit for hyponatremia dehydration and COVID-19 infection.  Assessment & Plan: 1-dehydration/hyponatremia -Hypovolemic in nature in the setting of poor oral intake -Continue fluid resuscitation; rate adjusted to minimize the chances of correcting sodium level too fast. -Continue telemetry monitoring -Follow electrolytes trend. -Patient advised to assist maintaining adequate oral hydration/nutrition.  2-Type 2 diabetes mellitus (HCC) -Continue sliding scale insulin and Tresiba nightly.  3-COVID-19 infection -No upper respiratory symptoms reported -Good saturation on room air. -Bronchitic changes and potential pulmonary vascular congestion on portable chest x-ray. -Patient tolerated well monoclonal antibody infusion. -Continue multivitamins and oral  Decadron.  4-physical deconditioning -Seen by physical therapy with recommendation for skilled nursing facility -Prior to admission patient was living by herself performing all activities of daily living. -Currently patient not enthusiastic about condition; expressed that she would like short-term placement only if facility is close by. -TOC aware and helping with discharge . 5-history of atrial flutter ([place]) -Rate controlled -Patient has declined anticoagulation in the past -Continue daily aspirin -Continue atenolol.  6-Essential hypertension, benign -Stable and well-controlled -Continue current antihypertensive agents (enalapril and atenolol). -Currently diet has been encouraged.  7-acute kidney injury -Creatinine 1.23 on presentation -Prerenal azotemia from dehydration -Continue IV fluids and minimizing nephrotoxic agents -Avoid hypotension and the use of contrast -Continue to follow renal function trend.  DVT prophylaxis: Lovenox Code Status: Full code Family Communication: No family at bedside.  Patient expressed that she will call her daughter to provide updates. Disposition:   Status is: Inpatient  Dispo: The patient is from: Home alone.              Anticipated d/c is to: To be determined (home with home health services versus skilled nursing facility).              Anticipated d/c date is: To be determined (1-2 days)              Patient currently no medically stable for discharge; still with ongoing hyponatremia and reporting general malaise, weakness and deconditioning.  Physical therapy has seen patient and recommendations were given for a skilled nursing facility rehabilitation.  TOC discussing with patient and patient's daughter to made decisions about discharge pathway.    Consultants:   None   Procedures:  See below for x-ray reports  Antimicrobials:  None  Subjective: No chest pain, no palpitations, no nausea, no vomiting.  Patient reports no  shortness of breath and demonstrate good saturation on room air.  She feels weak, tired and deconditioned.  Objective: Vitals:   06/01/20 2141 06/02/20 0100 06/02/20 0404 06/02/20 1053  BP: (!) 126/53 (!) 111/43 125/60 (!) 119/52  Pulse: 67 62 60 (!) 57  Resp: 19 20 18 20   Temp: 97.6 F (36.4 C)  97.6 F (36.4 C) 98.1 F (36.7 C)  TempSrc: Oral   Oral  SpO2:  100% 95% 96%  Weight: 86.8 kg       Intake/Output Summary (Last 24 hours) at 06/02/2020 1449 Last data filed at 06/02/2020 1000 Gross per 24 hour  Intake 360 ml  Output 1000 ml  Net -640 ml   Filed Weights   06/01/20 2141  Weight: 86.8 kg    Examination:  General exam: Appears calm and comfortable, no chest pain, no nausea, no vomiting.  Patient reports general malaise, weakness and deconditioning.  No shortness of breath and good saturation on room air appreciated. Respiratory system: Scattered rhonchi; no using accessory muscles, normal respiratory effort.  Good saturation on room air. Cardiovascular system: S1 & S2 heard, regular rate, no rubs, no gallops, no JVD on exam.  No lower extremity edema appreciated. Gastrointestinal system: Abdomen is nondistended, soft and nontender. No organomegaly or masses felt. Normal bowel sounds heard. Central nervous system: Alert and oriented. No focal neurological deficits. Extremities: No cyanosis or clubbing. Skin: No petechiae. Psychiatry: Judgement and insight appear normal. Mood & affect appropriate.     Data Reviewed: I have personally reviewed following labs and imaging studies  CBC: Recent Labs  Lab 06/01/20 1832 06/02/20 0352  WBC 3.8* 2.4*  NEUTROABS 3.1 2.0  HGB 11.2* 10.9*  HCT 31.5* 31.4*  MCV 88.2 89.7  PLT 102* 101*    Basic Metabolic Panel: Recent Labs  Lab 06/01/20 1832 06/02/20 0352 06/02/20 0743 06/02/20 0955  NA 119* 126* 127* 128*  K 4.6 5.2* 5.4* 5.2*  CL 91* 97* 98 98  CO2 20* 22 22 22   GLUCOSE 176* 186* 212* 188*  BUN 35* 29* 28*  27*  CREATININE 1.23* 1.00 1.08* 1.02*  CALCIUM 7.9* 7.9* 8.0* 8.2*  MG  --  1.5*  --   --   PHOS  --  3.1  --   --     GFR: CrCl cannot be calculated (Unknown ideal weight.).  Liver Function Tests: Recent Labs  Lab 06/01/20 1832 06/02/20 0352  AST 97* 82*  ALT 48* 42  ALKPHOS 51 45  BILITOT 0.6 0.5  PROT 5.8* 5.4*  ALBUMIN 3.1* 2.8*    CBG: Recent Labs  Lab 06/02/20 0747 06/02/20 1134  GLUCAP 190* 230*     Recent Results (from the past 240 hour(s))  Resp Panel by RT-PCR (Flu A&B, Covid) Nasopharyngeal Swab     Status: Abnormal   Collection Time: 06/01/20  8:00 PM   Specimen: Nasopharyngeal Swab; Nasopharyngeal(NP) swabs in vial transport medium  Result Value Ref Range Status   SARS Coronavirus 2 by RT PCR POSITIVE (A) NEGATIVE Final    Comment: RESULT CALLED TO, READ BACK BY AND VERIFIED WITH: H TETREAULT,RN@2136  06/01/20 MKELLY (NOTE) SARS-CoV-2 target nucleic acids are DETECTED.  The SARS-CoV-2 RNA is generally detectable in upper respiratory specimens during the acute phase of infection. Positive results are indicative of the presence of the identified virus, but do not rule out bacterial infection or co-infection with other  pathogens not detected by the test. Clinical correlation with patient history and other diagnostic information is necessary to determine patient infection status. The expected result is Negative.  Fact Sheet for Patients: EntrepreneurPulse.com.au  Fact Sheet for Healthcare Providers: IncredibleEmployment.be  This test is not yet approved or cleared by the [place] FDA and  has been authorized for detection and/or diagnosis of SARS-CoV-2 by FDA under an Emergency Use Authorization (EUA).  This EUA will remain in effect (meaning this test can  be used) for the duration of  the COVID-19 declaration under Section 564(b)(1) of the Act, 21 U.S.C. section 360bbb-3(b)(1), unless the authorization  is terminated or revoked sooner.     Influenza A by PCR NEGATIVE NEGATIVE Final   Influenza B by PCR NEGATIVE NEGATIVE Final    Comment: (NOTE) The Xpert Xpress SARS-CoV-2/FLU/RSV plus assay is intended as an aid in the diagnosis of influenza from Nasopharyngeal swab specimens and should not be used as a sole basis for treatment. Nasal washings and aspirates are unacceptable for Xpert Xpress SARS-CoV-2/FLU/RSV testing.  Fact Sheet for Patients: EntrepreneurPulse.com.au  Fact Sheet for Healthcare Providers: IncredibleEmployment.be  This test is not yet approved or cleared by the [place] FDA and has been authorized for detection and/or diagnosis of SARS-CoV-2 by FDA under an Emergency Use Authorization (EUA). This EUA will remain in effect (meaning this test can be used) for the duration of the COVID-19 declaration under Section 564(b)(1) of the Act, 21 U.S.C. section 360bbb-3(b)(1), unless the authorization is terminated or revoked.  Performed at [facility], [address], [place], [place] [address]      Radiology Studies: DG CHEST PORT 1 VIEW  Result Date: 06/01/2020 CLINICAL DATA:  Positive for COVID. EXAM: PORTABLE CHEST 1 VIEW COMPARISON:  None FINDINGS: Heart size appears normal. Aortic atherosclerosis. Coronary artery atherosclerotic calcifications identified. Pulmonary vascular congestion. Chronic bronchitic changes. No superimposed airspace or interstitial opacities. IMPRESSION: 1. Pulmonary vascular congestion. 2. Aortic atherosclerosis. Electronically Signed   By: Kerby Moors M.D.   On: 06/01/2020 20:41    Scheduled Meds: . vitamin C  500 mg Oral Daily  . aspirin EC  81 mg Oral Daily  . atenolol  50 mg Oral Daily  . dexamethasone  6 mg Oral Daily  . enalapril  20 mg Oral Daily  . enoxaparin (LOVENOX) injection  40 mg Subcutaneous Q24H  . guaiFENesin-dextromethorphan  10 mL Oral Q8H  . insulin aspart  0-15 Units  Subcutaneous TID WC  . insulin aspart  0-5 Units Subcutaneous QHS  . insulin glargine  12 Units Subcutaneous QHS  . pantoprazole  40 mg Oral Daily  . zinc sulfate  220 mg Oral Daily   Continuous Infusions: . sodium chloride    . sodium chloride 50 mL/hr at 06/02/20 0932  . famotidine (PEPCID) IV       LOS: 1 day    Time spent: 30 minutes.    Barton Dubois, MD [facility]   To contact the attending provider between 7A-7P or the covering provider during after hours 7P-7A, please log into the web site [email or web address] and access using universal [place] password for that web site. If you do not have the password, please call the hospital operator.  06/02/2020, 2:49 PM

## 2020-06-02 NOTE — Plan of Care (Signed)

## 2020-06-03 LAB — BASIC METABOLIC PANEL
Anion gap: 7 (ref 5–15)
BUN: 29 mg/dL — ABNORMAL HIGH (ref 8–23)
CO2: 22 mmol/L (ref 22–32)
Calcium: 8.4 mg/dL — ABNORMAL LOW (ref 8.9–10.3)
Chloride: 103 mmol/L (ref 98–111)
Creatinine, Ser: 1.02 mg/dL — ABNORMAL HIGH (ref 0.44–1.00)
GFR, Estimated: 54 mL/min — ABNORMAL LOW (ref >60)
Glucose, Bld: 158 mg/dL — ABNORMAL HIGH (ref 70–99)
Potassium: 5.3 mmol/L — ABNORMAL HIGH (ref 3.5–5.1)
Sodium: 132 mmol/L — ABNORMAL LOW (ref 135–145)

## 2020-06-03 LAB — GLUCOSE, CAPILLARY
Glucose-Capillary: 160 mg/dL — ABNORMAL HIGH (ref 70–99)
Glucose-Capillary: 149 mg/dL — ABNORMAL HIGH (ref 70–99)

## 2020-06-03 LAB — MAGNESIUM: Magnesium: 2.1 mg/dL (ref 1.7–2.4)

## 2020-06-03 LAB — SODIUM, URINE, RANDOM: Sodium, Ur: 30 mmol/L

## 2020-06-03 LAB — PHOSPHORUS: Phosphorus: 2.9 mg/dL (ref 2.5–4.6)

## 2020-06-03 LAB — OSMOLALITY, URINE: Osmolality, Ur: 205 mOsm/kg — ABNORMAL LOW (ref 300–900)

## 2020-06-03 MED ORDER — SODIUM ZIRCONIUM CYCLOSILICATE 10 G PO PACK
10.0000 g | PACK | Freq: Once | ORAL | Status: AC
  Administered 2020-06-03: 10 g via ORAL
  Filled 2020-06-03: qty 1

## 2020-06-03 MED ORDER — PANTOPRAZOLE SODIUM 40 MG PO TBEC: 40.0000 mg | DELAYED_RELEASE_TABLET | Freq: Every day | ORAL | 0 refills | Status: DC

## 2020-06-03 MED ORDER — DEXAMETHASONE 6 MG PO TABS: 6.0000 mg | ORAL_TABLET | Freq: Every day | ORAL | 0 refills | Status: AC

## 2020-06-03 MED ORDER — ZINC SULFATE 220 (50 ZN) MG PO CAPS: 220.0000 mg | ORAL_CAPSULE | Freq: Every day | ORAL | 0 refills | Status: AC

## 2020-06-03 MED ORDER — ALBUTEROL SULFATE HFA 108 (90 BASE) MCG/ACT IN AERS: 2.0000 | INHALATION_SPRAY | Freq: Once | RESPIRATORY_TRACT | 0 refills | Status: DC | PRN

## 2020-06-03 NOTE — TOC Progression Note (Signed)
Transition of Care [facility]) - Progression Note    Patient Details  Name: Audrey Walker MRN: [id number] Date of Birth: 07-21-1932  Transition of Care [facility]) CM/SW Contact  Ihor Gully, LCSW Phone Number: 06/03/2020, 9:23 AM  Clinical Narrative:    Message left for [name] to discuss lack of accepting [facility] agency.    Expected Discharge Plan: [place] Barriers to Discharge: No [facility] will accept this patient  Expected Discharge Plan and Services Expected Discharge Plan: [place] Choice: [place] arrangements for the past 2 months: Single Family Home                                       Social Determinants of Health (SDOH) Interventions    Readmission Risk Interventions No flowsheet data found.

## 2020-06-03 NOTE — Progress Notes (Signed)
Per Dr. Manuella Ghazi pt has DC orders but cannot DC until a [facility] agency accepts patient.

## 2020-06-03 NOTE — Plan of Care (Signed)

## 2020-06-03 NOTE — TOC Progression Note (Addendum)
Late Entry for 06/02/21 Transition of Care [facility]) - Progression Note    Patient Details  Name: Audrey Walker MRN: [id number] Date of Birth: 1933-02-27  Transition [facility]) CM/SW Contact  Ihor Gully, LCSW Phone Number: 06/03/2020, 9:15 AM  Clinical Narrative:    [name] contacted TOC and after speaking with patient indicated that they had chosen to [facility] as patient did not want to leave the area for SNF. Patient referred to [facility], [place], [place], [place], [facility], [facility], [facility], and all declined patient due to staffing issues, coverage area, or being out of network.    Expected Discharge Plan: [place] Barriers to discharge. NO HOME HEALTH AGENCY WILL ACCEPT THIS PATIENT.  Expected Discharge Plan and Services Expected Discharge Plan: [place] Choice: [place] arrangements for the past 2 months: Single Family Home                                       Social Determinants of Health (SDOH) Interventions    Readmission Risk Interventions No flowsheet data found.

## 2020-06-03 NOTE — Plan of Care (Signed)
  Problem: Education: Goal: Knowledge of General Education information will improve Description: Including pain rating scale, medication(s)/side effects and non-pharmacologic comfort measures 06/03/2020 1407 by Cameron Ali, RN Outcome: Completed/Met 06/03/2020 1015 by Cameron Ali, RN Outcome: Progressing   Problem: Health Behavior/Discharge Planning: Goal: Ability to manage health-related needs will improve 06/03/2020 1407 by Cameron Ali, RN Outcome: Completed/Met 06/03/2020 1015 by Cameron Ali, RN Outcome: Progressing   Problem: Clinical Measurements: Goal: Ability to maintain clinical measurements within normal limits will improve 06/03/2020 1407 by Cameron Ali, RN Outcome: Completed/Met 06/03/2020 1015 by Cameron Ali, RN Outcome: Progressing Goal: Will remain free from infection 06/03/2020 1407 by Cameron Ali, RN Outcome: Completed/Met 06/03/2020 1015 by Cameron Ali, RN Outcome: Progressing Goal: Diagnostic test results will improve 06/03/2020 1407 by Cameron Ali, RN Outcome: Completed/Met 06/03/2020 1015 by Cameron Ali, RN Outcome: Progressing Goal: Respiratory complications will improve 06/03/2020 1407 by Cameron Ali, RN Outcome: Completed/Met 06/03/2020 1015 by Cameron Ali, RN Outcome: Progressing Goal: Cardiovascular complication will be avoided 06/03/2020 1407 by Cameron Ali, RN Outcome: Completed/Met 06/03/2020 1015 by Cameron Ali, RN Outcome: Progressing   Problem: Activity: Goal: Risk for activity intolerance will decrease 06/03/2020 1407 by Cameron Ali, RN Outcome: Completed/Met 06/03/2020 1015 by Cameron Ali, RN Outcome: Progressing   Problem: Nutrition: Goal: Adequate nutrition will be maintained 06/03/2020 1407 by Cameron Ali, RN Outcome: Completed/Met 06/03/2020 1015 by Cameron Ali, RN Outcome: Progressing   Problem: Coping: Goal: Level  of anxiety will decrease 06/03/2020 1407 by Cameron Ali, RN Outcome: Completed/Met 06/03/2020 1015 by Cameron Ali, RN Outcome: Progressing   Problem: Elimination: Goal: Will not experience complications related to bowel motility 06/03/2020 1407 by Cameron Ali, RN Outcome: Completed/Met 06/03/2020 1015 by Cameron Ali, RN Outcome: Progressing Goal: Will not experience complications related to urinary retention 06/03/2020 1407 by Cameron Ali, RN Outcome: Completed/Met 06/03/2020 1015 by Cameron Ali, RN Outcome: Progressing   Problem: Pain Managment: Goal: General experience of comfort will improve 06/03/2020 1407 by Cameron Ali, RN Outcome: Completed/Met 06/03/2020 1015 by Cameron Ali, RN Outcome: Progressing   Problem: Safety: Goal: Ability to remain free from injury will improve 06/03/2020 1407 by Cameron Ali, RN Outcome: Completed/Met 06/03/2020 1015 by Cameron Ali, RN Outcome: Progressing   Problem: Skin Integrity: Goal: Risk for impaired skin integrity will decrease 06/03/2020 1407 by Cameron Ali, RN Outcome: Completed/Met 06/03/2020 1015 by Cameron Ali, RN Outcome: Progressing

## 2020-06-03 NOTE — Progress Notes (Signed)
Attempted to call daughter, Vickii Chafe at [phone] and house at [phone]. No answer at this time. Await on phone call back.

## 2020-06-03 NOTE — Discharge Summary (Signed)
Physician Discharge Summary  Audrey Walker POE:[id number] DOB: [birth year] DOA: 06/01/2020  PCP: Celene Squibb, MD  Admit date: 06/01/2020  Discharge date: 06/03/2020  Admitted From:Home  Disposition:  Home  Recommendations for Outpatient Follow-up:  1. Follow up with PCP in 1-2 weeks 2. Continue on dexamethasone as prescribed for 7 more days 3. Continue on albuterol inhaler as needed for shortness of breath or wheezing 4. Continue vitamin C and zinc supplementations 5. Continue other home medications as noted below  Home Health: Yes with RN and PT  Equipment/Devices: None  Discharge Condition:Stable  CODE STATUS: Full  Diet recommendation: Heart Healthy/carb modified  Brief/Interim Summary: As per H&P written by Dr. Denton Brick on 06/01/2020 Audrey Walker a 86 y.o.femalewith medical history significant fordiabetes mellitus, atrial flutter, hypertension. Patient presented to the ED with complaints of generalized weakness, of 10 days duration. She reports for the past several days she has not been unable to keep anything down, with multiple episodes of vomiting. No abdominal pain. She also reports diarrhea, but thishas improved, she has had one stool today.Shefeels dehydrated with dry mouth. She reports a cough also. She reports lightheadedness.She denies difficulty breathing. No chest pain. Tested as an outpatient for Covid and was positive. Patient is not vaccinated.  ED Course:Temperature 98.1. Heart rate 64, blood pressure systolic 315 400. O2 sats-94 200% on room air. Sodium 119. Stable creatinine 1.23. EKG showed atrial fibrillation rate 71. 1 L bolus normal saline given. Hospitalist to admit for hyponatremia dehydration and COVID-19 infection.  -Patient was admitted with dehydration and hyponatremia in the setting of COVID-19 infection.  She received monoclonal antibody infusion and has been started on steroids.  She has not been hypoxemic or in any  form of respiratory distress.  She was noted to be quite hypovolemic from her nausea and vomiting which has now resolved.  She has received IV fluid with improvement in sodium levels up to 132 on day of discharge.  She is in stable condition for discharge and would like to go home today.  No other acute events noted throughout the course of this admission.  She will require home health PT and RN which is being arranged for her at this time as she lives by herself.  She was initially offered SNF, but both patient and family refused.  Discharge Diagnoses:  Principal Problem:   Hyponatremia Active Problems:   Type 2 diabetes mellitus (HCC)   Atrial flutter (HCC)   Essential hypertension, benign   COVID-19 virus infection  Principal discharge diagnosis: Dehydration with hyponatremia in the setting of poor oral intake related to COVID-19 infection.  Discharge Instructions  Discharge Instructions    Diet - low sodium heart healthy   Complete by: As directed    Increase activity slowly   Complete by: As directed      Allergies as of 06/03/2020      Reactions   Nsaids Other (See Comments)   Per nephrologist      Medication List    TAKE these medications   acetaminophen 500 MG tablet Commonly known as: TYLENOL Take 500 mg by mouth every 6 (six) hours as needed for mild pain.   albuterol 108 (90 Base) MCG/ACT inhaler Commonly known as: VENTOLIN HFA Inhale 2 puffs into the lungs once as needed for wheezing or shortness of breath (for Grade 3 or 4 hypersensitivity reaction).   artificial tears ointment Place 2 drops into both eyes 3 (three) times daily.   aspirin EC 81 MG tablet Take  81 mg by mouth daily as needed.   atenolol 50 MG tablet Commonly known as: TENORMIN Take 1 tablet (50 mg total) by mouth daily.   B-D ULTRAFINE III SHORT PEN 31G X 8 MM Misc Generic drug: Insulin Pen Needle Inject 1 Syringe into the skin daily.   benzonatate 100 MG capsule Commonly known as:  TESSALON Take 100 mg by mouth 3 (three) times daily.   dexamethasone 6 MG tablet Commonly known as: DECADRON Take 1 tablet (6 mg total) by mouth daily for 7 days. Start taking on: June 04, 2020   enalapril 20 MG tablet Commonly known as: VASOTEC Take 20 mg by mouth daily.   fish oil-omega-3 fatty acids 1000 MG capsule Take 1 g by mouth daily.   ONE TOUCH ULTRA 2 w/Device Kit   pantoprazole 40 MG tablet Commonly known as: PROTONIX Take 1 tablet (40 mg total) by mouth daily for 7 days. Start taking on: June 04, 2020   prednisoLONE acetate 1 % ophthalmic suspension Commonly known as: PRED FORTE Place 1 drop into the right eye daily as needed.   Tyler Aas FlexTouch 100 UNIT/ML FlexTouch Pen Generic drug: insulin degludec Inject 12 Units into the skin daily.   VITAMIN B 12 PO Take 1 tablet by mouth daily.   vitamin C 1000 MG tablet Take 2,000 mg by mouth daily.   Vitamin D3 1.25 MG (50000 UT) Caps Take 1 capsule by mouth daily.   zinc sulfate 220 (50 Zn) MG capsule Take 1 capsule (220 mg total) by mouth daily. Start taking on: June 04, 2020       Follow-up Information    Celene Squibb, MD Follow up in 1 week(s).   Specialty: Internal Medicine Contact information: [facility] [address] [phone]        Satira Sark, MD .   Specialty: Cardiology Contact information: [place] [address] [phone]              Allergies  Allergen Reactions  . Nsaids Other (See Comments)    Per nephrologist    Consultations:  None   Procedures/Studies: DG CHEST PORT 1 VIEW  Result Date: 06/01/2020 CLINICAL DATA:  Positive for COVID. EXAM: PORTABLE CHEST 1 VIEW COMPARISON:  None FINDINGS: Heart size appears normal. Aortic atherosclerosis. Coronary artery atherosclerotic calcifications identified. Pulmonary vascular congestion. Chronic bronchitic changes. No superimposed airspace or interstitial  opacities. IMPRESSION: 1. Pulmonary vascular congestion. 2. Aortic atherosclerosis. Electronically Signed   By: Kerby Moors M.D.   On: 06/01/2020 20:41      Discharge Exam: Vitals:   06/02/20 2114 06/03/20 0609  BP: 122/64 125/70  Pulse: (!) 58 (!) 59  Resp: 18 20  Temp: 98 F (36.7 C) (!) 97.5 F (36.4 C)  SpO2: 92% 96%   Vitals:   06/02/20 1053 06/02/20 1457 06/02/20 2114 06/03/20 0609  BP: (!) 119/52 134/78 122/64 125/70  Pulse: (!) 57 (!) 59 (!) 58 (!) 59  Resp: $Remo'20 20 18 20  'WjoPv$ Temp: 98.1 F (36.7 C) 98.2 F (36.8 C) 98 F (36.7 C) (!) 97.5 F (36.4 C)  TempSrc: Oral Oral Oral Oral  SpO2: 96% 96% 92% 96%  Weight:        General: Pt is alert, awake, not in acute distress Cardiovascular: RRR, S1/S2 +, no rubs, no gallops Respiratory: CTA bilaterally, no wheezing, no rhonchi Abdominal: Soft, NT, ND, bowel sounds + Extremities: no edema, no cyanosis    The results  of significant diagnostics from this hospitalization (including imaging, microbiology, ancillary and laboratory) are listed below for reference.     Microbiology: Recent Results (from the past 240 hour(s))  Resp Panel by RT-PCR (Flu A&B, Covid) Nasopharyngeal Swab     Status: Abnormal   Collection Time: 06/01/20  8:00 PM   Specimen: Nasopharyngeal Swab; Nasopharyngeal(NP) swabs in vial transport medium  Result Value Ref Range Status   SARS Coronavirus 2 by RT PCR POSITIVE (A) NEGATIVE Final    Comment: RESULT CALLED TO, READ BACK BY AND VERIFIED WITH: H TETREAULT,RN@2136  06/01/20 MKELLY (NOTE) SARS-CoV-2 target nucleic acids are DETECTED.  The SARS-CoV-2 RNA is generally detectable in upper respiratory specimens during the acute phase of infection. Positive results are indicative of the presence of the identified virus, but do not rule out bacterial infection or co-infection with other pathogens not detected by the test. Clinical correlation with patient history and other diagnostic information is  necessary to determine patient infection status. The expected result is Negative.  Fact Sheet for Patients: EntrepreneurPulse.com.au  Fact Sheet for Healthcare Providers: IncredibleEmployment.be  This test is not yet approved or cleared by the [place] FDA and  has been authorized for detection and/or diagnosis of SARS-CoV-2 by FDA under an Emergency Use Authorization (EUA).  This EUA will remain in effect (meaning this test can  be used) for the duration of  the COVID-19 declaration under Section 564(b)(1) of the Act, 21 U.S.C. section 360bbb-3(b)(1), unless the authorization is terminated or revoked sooner.     Influenza A by PCR NEGATIVE NEGATIVE Final   Influenza B by PCR NEGATIVE NEGATIVE Final    Comment: (NOTE) The Xpert Xpress SARS-CoV-2/FLU/RSV plus assay is intended as an aid in the diagnosis of influenza from Nasopharyngeal swab specimens and should not be used as a sole basis for treatment. Nasal washings and aspirates are unacceptable for Xpert Xpress SARS-CoV-2/FLU/RSV testing.  Fact Sheet for Patients: EntrepreneurPulse.com.au  Fact Sheet for Healthcare Providers: IncredibleEmployment.be  This test is not yet approved or cleared by the [place] FDA and has been authorized for detection and/or diagnosis of SARS-CoV-2 by FDA under an Emergency Use Authorization (EUA). This EUA will remain in effect (meaning this test can be used) for the duration of the COVID-19 declaration under Section 564(b)(1) of the Act, 21 U.S.C. section 360bbb-3(b)(1), unless the authorization is terminated or revoked.  Performed at [facility], [address] [place], [place] [address]      Labs: BNP (last 3 results) No results for input(s): BNP in the last 8760 hours. Basic Metabolic Panel: Recent Labs  Lab 06/02/20 0352 06/02/20 0743 06/02/20 0955 06/02/20 1656 06/02/20 2237 06/03/20 0355   NA 126* 127* 128* 122* 130* 132*  K 5.2* 5.4* 5.2* 4.9 5.1 5.3*  CL 97* 98 98 95* 102 103  CO2 22 22 22  20* 20* 22  GLUCOSE 186* 212* 188* 130* 175* 158*  BUN 29* 28* 27* 31* 28* 29*  CREATININE 1.00 1.08* 1.02* 1.07* 1.04* 1.02*  CALCIUM 7.9* 8.0* 8.2* 7.8* 8.4* 8.4*  MG 1.5*  --   --   --   --  2.1  PHOS 3.1  --   --   --   --  2.9   Liver Function Tests: Recent Labs  Lab 06/01/20 1832 06/02/20 0352  AST 97* 82*  ALT 48* 42  ALKPHOS 51 45  BILITOT 0.6 0.5  PROT 5.8* 5.4*  ALBUMIN 3.1* 2.8*   No results for input(s): LIPASE, AMYLASE  in the last 168 hours. No results for input(s): AMMONIA in the last 168 hours. CBC: Recent Labs  Lab 06/01/20 1832 06/02/20 0352  WBC 3.8* 2.4*  NEUTROABS 3.1 2.0  HGB 11.2* 10.9*  HCT 31.5* 31.4*  MCV 88.2 89.7  PLT 102* 101*   Cardiac Enzymes: No results for input(s): CKTOTAL, CKMB, CKMBINDEX, TROPONINI in the last 168 hours. BNP: Invalid input(s): POCBNP CBG: Recent Labs  Lab 06/02/20 0747 06/02/20 1134 06/02/20 1622 06/02/20 2111 06/03/20 0758  GLUCAP 190* 230* 228* 153* 160*   D-Dimer Recent Labs    06/01/20 2304 06/02/20 0352  DDIMER 2.61* 2.41*   Hgb A1c Recent Labs    06/01/20 1832  HGBA1C 6.5*   Lipid Profile No results for input(s): CHOL, HDL, LDLCALC, TRIG, CHOLHDL, LDLDIRECT in the last 72 hours. Thyroid function studies Recent Labs    06/01/20 2304  TSH 0.768   Anemia work up Recent Labs    06/01/20 2304 06/02/20 0352  FERRITIN 714* 742*   Urinalysis    Component Value Date/Time   COLORURINE STRAW (A) 06/01/2020 1945   APPEARANCEUR CLEAR 06/01/2020 1945   LABSPEC 1.004 (L) 06/01/2020 1945   PHURINE 5.0 06/01/2020 1945   GLUCOSEU NEGATIVE 06/01/2020 1945   HGBUR SMALL (A) 06/01/2020 1945   BILIRUBINUR NEGATIVE 06/01/2020 1945   KETONESUR NEGATIVE 06/01/2020 1945   PROTEINUR NEGATIVE 06/01/2020 1945   NITRITE NEGATIVE 06/01/2020 1945   LEUKOCYTESUR NEGATIVE 06/01/2020 1945   Sepsis  Labs Invalid input(s): PROCALCITONIN,  WBC,  LACTICIDVEN Microbiology Recent Results (from the past 240 hour(s))  Resp Panel by RT-PCR (Flu A&B, Covid) Nasopharyngeal Swab     Status: Abnormal   Collection Time: 06/01/20  8:00 PM   Specimen: Nasopharyngeal Swab; Nasopharyngeal(NP) swabs in vial transport medium  Result Value Ref Range Status   SARS Coronavirus 2 by RT PCR POSITIVE (A) NEGATIVE Final    Comment: RESULT CALLED TO, READ BACK BY AND VERIFIED WITH: H TETREAULT,RN@2136  06/01/20 MKELLY (NOTE) SARS-CoV-2 target nucleic acids are DETECTED.  The SARS-CoV-2 RNA is generally detectable in upper respiratory specimens during the acute phase of infection. Positive results are indicative of the presence of the identified virus, but do not rule out bacterial infection or co-infection with other pathogens not detected by the test. Clinical correlation with patient history and other diagnostic information is necessary to determine patient infection status. The expected result is Negative.  Fact Sheet for Patients: EntrepreneurPulse.com.au  Fact Sheet for Healthcare Providers: IncredibleEmployment.be  This test is not yet approved or cleared by the [place] FDA and  has been authorized for detection and/or diagnosis of SARS-CoV-2 by FDA under an Emergency Use Authorization (EUA).  This EUA will remain in effect (meaning this test can  be used) for the duration of  the COVID-19 declaration under Section 564(b)(1) of the Act, 21 U.S.C. section 360bbb-3(b)(1), unless the authorization is terminated or revoked sooner.     Influenza A by PCR NEGATIVE NEGATIVE Final   Influenza B by PCR NEGATIVE NEGATIVE Final    Comment: (NOTE) The Xpert Xpress SARS-CoV-2/FLU/RSV plus assay is intended as an aid in the diagnosis of influenza from Nasopharyngeal swab specimens and should not be used as a sole basis for treatment. Nasal washings and aspirates  are unacceptable for Xpert Xpress SARS-CoV-2/FLU/RSV testing.  Fact Sheet for Patients: EntrepreneurPulse.com.au  Fact Sheet for Healthcare Providers: IncredibleEmployment.be  This test is not yet approved or cleared by the [place] FDA and has been authorized for detection and/or  diagnosis of SARS-CoV-2 by FDA under an Emergency Use Authorization (EUA). This EUA will remain in effect (meaning this test can be used) for the duration of the COVID-19 declaration under Section 564(b)(1) of the Act, 21 U.S.C. section 360bbb-3(b)(1), unless the authorization is terminated or revoked.  Performed at [facility], [address] [place], [place] [address]      Time coordinating discharge: 35 minutes  SIGNED:   Rodena Goldmann, DO [facility] 06/03/2020, 11:12 AM  If 7PM-7AM, please contact night-coverage [email or web address]

## 2020-06-03 NOTE — TOC Transition Note (Signed)
Transition of Care [facility]) - CM/SW Discharge Note   Patient Details  Name: Audrey Walker MRN: [id number] Date of Birth: 15-Dec-1932  [facility] CM/SW Contact:  Ihor Gully, LCSW Phone Number: 06/03/2020, 1:59 PM   Clinical Narrative:    Patient referred to [facility] a second time and referral was accepted for RN/PT, however PT will not start until next week.      Barriers to Discharge: No Barriers Identified   Patient Goals and CMS Choice        Discharge Placement                       Discharge Plan and Services     Post Acute Care Choice: Home Health          DME Arranged: Walker rolling DME Agency: [employer] Date DME Agency Contacted: 06/03/20 Time DME Agency Contacted: [id number] Representative spoke with at DME Agency: [place]: RN,PT [place] Agency: [place] ([place]) Date [place]: 06/03/20 Time [place]: [phone] Representative spoke with at [place]: [place] Determinants of Health ([place]) Interventions     Readmission Risk Interventions No flowsheet data found.

## 2020-06-03 NOTE — Progress Notes (Signed)
Physical Therapy Treatment Patient Details Name: Audrey Walker MRN: [id number] DOB: 08/20/1932 Today's Date: 06/03/2020    History of Present Illness Audrey Walker is a 86 y.o. female with medical history significant for diabetes mellitus, atrial flutter, hypertension.  Patient presented to the ED with complaints of generalized weakness, of 10 days duration.  She reports for the past several days she has not been unable to keep anything down, with multiple episodes of vomiting.  No abdominal pain.  She also reports diarrhea, but this has improved, she has had one stool today.  She feels dehydrated with dry mouth.  She reports a cough also.  She reports lightheadedness.  She denies difficulty breathing.  No chest pain.   Tested as an outpatient for Covid and was positive.  Patient is not vaccinated.    PT Comments    Patient demonstrates increased endurance/distance for gait training in room using RW without loss of balance, demonstrates good return for transferring to commode in bathroom using grab bar and RW, able to stand over sink to wash hands afterwards, however unsteady when attempting to take steps without use of RW and instructed to use her RW when she returns home with understanding acknowledged.  Patient continued sitting up in chair after therapy - RN aware.  Patient will benefit from continued physical therapy in hospital and recommended venue below to increase strength, balance, endurance for safe ADLs and gait.    Follow Up Recommendations  Home health PT;Supervision - Intermittent     Equipment Recommendations  Rolling walker with 5" wheels    Recommendations for Other Services       Precautions / Restrictions Precautions Precautions: Fall Restrictions Weight Bearing Restrictions: No    Mobility  Bed Mobility               General bed mobility comments: Patient presents seated in chair  Transfers Overall transfer level: Needs assistance Equipment used:  Rolling walker (2 wheeled) Transfers: Sit to/from Stand;Stand Pivot Transfers Sit to Stand: Min guard;Supervision Stand pivot transfers: Min guard;Supervision       General transfer comment: Min guard/supervision without AD with slow labored movement, Mod Independent/supervision using RW  Ambulation/Gait Ambulation/Gait assistance: Modified independent (Device/Increase time);Supervision Gait Distance (Feet): 80 Feet Assistive device: Rolling walker (2 wheeled) Gait Pattern/deviations: Decreased step length - left;Decreased stance time - right;Decreased stride length Gait velocity: slightly decreased   General Gait Details: demonstrates increased endurance/distance for ambulation in room without loss of balance, good return for making turns using RW   Stairs             Wheelchair Mobility    Modified Rankin (Stroke Patients Only)       Balance Overall balance assessment: Needs assistance Sitting-balance support: Feet supported;No upper extremity supported Sitting balance-Leahy Scale: Good Sitting balance - Comments: seated at EOB   Standing balance support: During functional activity;No upper extremity supported Standing balance-Leahy Scale: Fair Standing balance comment: fair/poor without AD, fair/good using RW                            Cognition Arousal/Alertness: Awake/alert Behavior During Therapy: WFL for tasks assessed/performed Overall Cognitive Status: Within Functional Limits for tasks assessed                                        Exercises General Exercises - Lower  Extremity Long Arc Quad: Seated;AROM;Strengthening;Both;20 reps Hip Flexion/Marching: Seated;AROM;Strengthening;Both;20 reps Toe Raises: Seated;AROM;Strengthening;Both;20 reps Heel Raises: Seated;AROM;Strengthening;Both;20 reps    General Comments        Pertinent Vitals/Pain Pain Assessment: No/denies pain    Home Living                       Prior Function            PT Goals (current goals can now be found in the care plan section) Acute Rehab PT Goals Patient Stated Goal: return home with family to assist PT Goal Formulation: With patient Time For Goal Achievement: 06/18/20 Potential to Achieve Goals: Good Progress towards PT goals: Progressing toward goals    Frequency    Min 3X/week      PT Plan Discharge plan needs to be updated    Co-evaluation              AM-PAC PT "6 Clicks" Mobility   Outcome Measure  Help needed turning from your back to your side while in a flat bed without using bedrails?: None Help needed moving from lying on your back to sitting on the side of a flat bed without using bedrails?: None Help needed moving to and from a bed to a chair (including a wheelchair)?: A Little Help needed standing up from a chair using your arms (e.g., wheelchair or bedside chair)?: None Help needed to walk in hospital room?: A Little Help needed climbing 3-5 steps with a railing? : A Little 6 Click Score: 21    End of Session   Activity Tolerance: Patient tolerated treatment well;Patient limited by fatigue Patient left: in chair;with call bell/phone within reach Nurse Communication: Mobility status PT Visit Diagnosis: Unsteadiness on feet (R26.81);Other abnormalities of gait and mobility (R26.89);Muscle weakness (generalized) (M62.81)     Time: 1110-1140 PT Time Calculation (min) (ACUTE ONLY): 30 min  Charges:  $Gait Training: 8-22 mins $Therapeutic Exercise: 8-22 mins                     12:18 PM, 06/03/20 Lonell Grandchild, MPT Physical Therapist with [facility] [phone] office [phone] mobile phone

## 2020-06-04 DIAGNOSIS — I4892 Unspecified atrial flutter: Secondary | ICD-10-CM | POA: Diagnosis not present

## 2020-06-04 DIAGNOSIS — U071 COVID-19: Secondary | ICD-10-CM | POA: Diagnosis not present

## 2020-06-04 DIAGNOSIS — E871 Hypo-osmolality and hyponatremia: Secondary | ICD-10-CM | POA: Diagnosis not present

## 2020-06-04 DIAGNOSIS — I129 Hypertensive chronic kidney disease with stage 1 through stage 4 chronic kidney disease, or unspecified chronic kidney disease: Secondary | ICD-10-CM | POA: Diagnosis not present

## 2020-06-04 DIAGNOSIS — E1122 Type 2 diabetes mellitus with diabetic chronic kidney disease: Secondary | ICD-10-CM | POA: Diagnosis not present

## 2020-06-04 DIAGNOSIS — N182 Chronic kidney disease, stage 2 (mild): Secondary | ICD-10-CM | POA: Diagnosis not present

## 2020-06-04 DIAGNOSIS — Z7982 Long term (current) use of aspirin: Secondary | ICD-10-CM | POA: Diagnosis not present

## 2020-06-04 DIAGNOSIS — Z794 Long term (current) use of insulin: Secondary | ICD-10-CM | POA: Diagnosis not present

## 2020-06-08 DIAGNOSIS — I129 Hypertensive chronic kidney disease with stage 1 through stage 4 chronic kidney disease, or unspecified chronic kidney disease: Secondary | ICD-10-CM | POA: Diagnosis not present

## 2020-06-08 DIAGNOSIS — Z7982 Long term (current) use of aspirin: Secondary | ICD-10-CM | POA: Diagnosis not present

## 2020-06-08 DIAGNOSIS — E871 Hypo-osmolality and hyponatremia: Secondary | ICD-10-CM | POA: Diagnosis not present

## 2020-06-08 DIAGNOSIS — E1122 Type 2 diabetes mellitus with diabetic chronic kidney disease: Secondary | ICD-10-CM | POA: Diagnosis not present

## 2020-06-08 DIAGNOSIS — I4892 Unspecified atrial flutter: Secondary | ICD-10-CM | POA: Diagnosis not present

## 2020-06-08 DIAGNOSIS — Z794 Long term (current) use of insulin: Secondary | ICD-10-CM | POA: Diagnosis not present

## 2020-06-08 DIAGNOSIS — U071 COVID-19: Secondary | ICD-10-CM | POA: Diagnosis not present

## 2020-06-08 DIAGNOSIS — N182 Chronic kidney disease, stage 2 (mild): Secondary | ICD-10-CM | POA: Diagnosis not present

## 2020-06-11 DIAGNOSIS — Z794 Long term (current) use of insulin: Secondary | ICD-10-CM | POA: Diagnosis not present

## 2020-06-11 DIAGNOSIS — I129 Hypertensive chronic kidney disease with stage 1 through stage 4 chronic kidney disease, or unspecified chronic kidney disease: Secondary | ICD-10-CM | POA: Diagnosis not present

## 2020-06-11 DIAGNOSIS — N182 Chronic kidney disease, stage 2 (mild): Secondary | ICD-10-CM | POA: Diagnosis not present

## 2020-06-11 DIAGNOSIS — I4892 Unspecified atrial flutter: Secondary | ICD-10-CM | POA: Diagnosis not present

## 2020-06-11 DIAGNOSIS — U071 COVID-19: Secondary | ICD-10-CM | POA: Diagnosis not present

## 2020-06-11 DIAGNOSIS — Z7982 Long term (current) use of aspirin: Secondary | ICD-10-CM | POA: Diagnosis not present

## 2020-06-11 DIAGNOSIS — E1122 Type 2 diabetes mellitus with diabetic chronic kidney disease: Secondary | ICD-10-CM | POA: Diagnosis not present

## 2020-06-11 DIAGNOSIS — E871 Hypo-osmolality and hyponatremia: Secondary | ICD-10-CM | POA: Diagnosis not present

## 2020-06-12 DIAGNOSIS — E1122 Type 2 diabetes mellitus with diabetic chronic kidney disease: Secondary | ICD-10-CM | POA: Diagnosis not present

## 2020-06-12 DIAGNOSIS — N183 Chronic kidney disease, stage 3 unspecified: Secondary | ICD-10-CM | POA: Diagnosis not present

## 2020-06-12 DIAGNOSIS — I129 Hypertensive chronic kidney disease with stage 1 through stage 4 chronic kidney disease, or unspecified chronic kidney disease: Secondary | ICD-10-CM | POA: Diagnosis not present

## 2020-06-12 DIAGNOSIS — E7849 Other hyperlipidemia: Secondary | ICD-10-CM | POA: Diagnosis not present

## 2020-06-17 DIAGNOSIS — Z09 Encounter for follow-up examination after completed treatment for conditions other than malignant neoplasm: Secondary | ICD-10-CM | POA: Diagnosis not present

## 2020-06-17 DIAGNOSIS — Z712 Person consulting for explanation of examination or test findings: Secondary | ICD-10-CM | POA: Diagnosis not present

## 2020-06-17 DIAGNOSIS — N1831 Chronic kidney disease, stage 3a: Secondary | ICD-10-CM | POA: Diagnosis not present

## 2020-06-17 DIAGNOSIS — E871 Hypo-osmolality and hyponatremia: Secondary | ICD-10-CM | POA: Diagnosis not present

## 2020-06-17 DIAGNOSIS — R202 Paresthesia of skin: Secondary | ICD-10-CM | POA: Diagnosis not present

## 2020-06-17 DIAGNOSIS — Z6830 Body mass index (BMI) 30.0-30.9, adult: Secondary | ICD-10-CM | POA: Diagnosis not present

## 2020-06-17 DIAGNOSIS — I483 Typical atrial flutter: Secondary | ICD-10-CM | POA: Diagnosis not present

## 2020-06-17 DIAGNOSIS — I10 Essential (primary) hypertension: Secondary | ICD-10-CM | POA: Diagnosis not present

## 2020-06-17 DIAGNOSIS — U071 COVID-19: Secondary | ICD-10-CM | POA: Diagnosis not present

## 2020-06-17 DIAGNOSIS — M179 Osteoarthritis of knee, unspecified: Secondary | ICD-10-CM | POA: Diagnosis not present

## 2020-06-17 DIAGNOSIS — K589 Irritable bowel syndrome without diarrhea: Secondary | ICD-10-CM | POA: Diagnosis not present

## 2020-06-17 DIAGNOSIS — Z0001 Encounter for general adult medical examination with abnormal findings: Secondary | ICD-10-CM | POA: Diagnosis not present

## 2020-06-17 DIAGNOSIS — E119 Type 2 diabetes mellitus without complications: Secondary | ICD-10-CM | POA: Diagnosis not present

## 2020-06-17 DIAGNOSIS — E1122 Type 2 diabetes mellitus with diabetic chronic kidney disease: Secondary | ICD-10-CM | POA: Diagnosis not present

## 2020-06-17 DIAGNOSIS — N182 Chronic kidney disease, stage 2 (mild): Secondary | ICD-10-CM | POA: Diagnosis not present

## 2020-06-19 DIAGNOSIS — Z7982 Long term (current) use of aspirin: Secondary | ICD-10-CM | POA: Diagnosis not present

## 2020-06-19 DIAGNOSIS — Z794 Long term (current) use of insulin: Secondary | ICD-10-CM | POA: Diagnosis not present

## 2020-06-19 DIAGNOSIS — U071 COVID-19: Secondary | ICD-10-CM | POA: Diagnosis not present

## 2020-06-19 DIAGNOSIS — N182 Chronic kidney disease, stage 2 (mild): Secondary | ICD-10-CM | POA: Diagnosis not present

## 2020-06-19 DIAGNOSIS — I129 Hypertensive chronic kidney disease with stage 1 through stage 4 chronic kidney disease, or unspecified chronic kidney disease: Secondary | ICD-10-CM | POA: Diagnosis not present

## 2020-06-19 DIAGNOSIS — E871 Hypo-osmolality and hyponatremia: Secondary | ICD-10-CM | POA: Diagnosis not present

## 2020-06-19 DIAGNOSIS — I4892 Unspecified atrial flutter: Secondary | ICD-10-CM | POA: Diagnosis not present

## 2020-06-19 DIAGNOSIS — E1122 Type 2 diabetes mellitus with diabetic chronic kidney disease: Secondary | ICD-10-CM | POA: Diagnosis not present

## 2020-06-22 DIAGNOSIS — U071 COVID-19: Secondary | ICD-10-CM | POA: Diagnosis not present

## 2020-06-22 DIAGNOSIS — E1122 Type 2 diabetes mellitus with diabetic chronic kidney disease: Secondary | ICD-10-CM | POA: Diagnosis not present

## 2020-06-22 DIAGNOSIS — Z794 Long term (current) use of insulin: Secondary | ICD-10-CM | POA: Diagnosis not present

## 2020-06-22 DIAGNOSIS — E871 Hypo-osmolality and hyponatremia: Secondary | ICD-10-CM | POA: Diagnosis not present

## 2020-06-22 DIAGNOSIS — I129 Hypertensive chronic kidney disease with stage 1 through stage 4 chronic kidney disease, or unspecified chronic kidney disease: Secondary | ICD-10-CM | POA: Diagnosis not present

## 2020-06-22 DIAGNOSIS — Z7982 Long term (current) use of aspirin: Secondary | ICD-10-CM | POA: Diagnosis not present

## 2020-06-22 DIAGNOSIS — N182 Chronic kidney disease, stage 2 (mild): Secondary | ICD-10-CM | POA: Diagnosis not present

## 2020-06-22 DIAGNOSIS — I4892 Unspecified atrial flutter: Secondary | ICD-10-CM | POA: Diagnosis not present

## 2020-06-23 DIAGNOSIS — Z794 Long term (current) use of insulin: Secondary | ICD-10-CM | POA: Diagnosis not present

## 2020-06-23 DIAGNOSIS — E871 Hypo-osmolality and hyponatremia: Secondary | ICD-10-CM | POA: Diagnosis not present

## 2020-06-23 DIAGNOSIS — I129 Hypertensive chronic kidney disease with stage 1 through stage 4 chronic kidney disease, or unspecified chronic kidney disease: Secondary | ICD-10-CM | POA: Diagnosis not present

## 2020-06-23 DIAGNOSIS — I4892 Unspecified atrial flutter: Secondary | ICD-10-CM | POA: Diagnosis not present

## 2020-06-23 DIAGNOSIS — N182 Chronic kidney disease, stage 2 (mild): Secondary | ICD-10-CM | POA: Diagnosis not present

## 2020-06-23 DIAGNOSIS — E1122 Type 2 diabetes mellitus with diabetic chronic kidney disease: Secondary | ICD-10-CM | POA: Diagnosis not present

## 2020-06-23 DIAGNOSIS — U071 COVID-19: Secondary | ICD-10-CM | POA: Diagnosis not present

## 2020-06-23 DIAGNOSIS — Z7982 Long term (current) use of aspirin: Secondary | ICD-10-CM | POA: Diagnosis not present

## 2020-06-25 DIAGNOSIS — E871 Hypo-osmolality and hyponatremia: Secondary | ICD-10-CM | POA: Diagnosis not present

## 2020-07-02 DIAGNOSIS — U071 COVID-19: Secondary | ICD-10-CM | POA: Diagnosis not present

## 2020-07-02 DIAGNOSIS — I4892 Unspecified atrial flutter: Secondary | ICD-10-CM | POA: Diagnosis not present

## 2020-07-02 DIAGNOSIS — Z794 Long term (current) use of insulin: Secondary | ICD-10-CM | POA: Diagnosis not present

## 2020-07-02 DIAGNOSIS — Z7982 Long term (current) use of aspirin: Secondary | ICD-10-CM | POA: Diagnosis not present

## 2020-07-02 DIAGNOSIS — I129 Hypertensive chronic kidney disease with stage 1 through stage 4 chronic kidney disease, or unspecified chronic kidney disease: Secondary | ICD-10-CM | POA: Diagnosis not present

## 2020-07-02 DIAGNOSIS — E1122 Type 2 diabetes mellitus with diabetic chronic kidney disease: Secondary | ICD-10-CM | POA: Diagnosis not present

## 2020-07-02 DIAGNOSIS — N182 Chronic kidney disease, stage 2 (mild): Secondary | ICD-10-CM | POA: Diagnosis not present

## 2020-07-02 DIAGNOSIS — E871 Hypo-osmolality and hyponatremia: Secondary | ICD-10-CM | POA: Diagnosis not present

## 2020-07-03 DIAGNOSIS — Z7982 Long term (current) use of aspirin: Secondary | ICD-10-CM | POA: Diagnosis not present

## 2020-07-03 DIAGNOSIS — I129 Hypertensive chronic kidney disease with stage 1 through stage 4 chronic kidney disease, or unspecified chronic kidney disease: Secondary | ICD-10-CM | POA: Diagnosis not present

## 2020-07-03 DIAGNOSIS — U071 COVID-19: Secondary | ICD-10-CM | POA: Diagnosis not present

## 2020-07-03 DIAGNOSIS — E871 Hypo-osmolality and hyponatremia: Secondary | ICD-10-CM | POA: Diagnosis not present

## 2020-07-03 DIAGNOSIS — N182 Chronic kidney disease, stage 2 (mild): Secondary | ICD-10-CM | POA: Diagnosis not present

## 2020-07-03 DIAGNOSIS — I4892 Unspecified atrial flutter: Secondary | ICD-10-CM | POA: Diagnosis not present

## 2020-07-03 DIAGNOSIS — Z794 Long term (current) use of insulin: Secondary | ICD-10-CM | POA: Diagnosis not present

## 2020-07-03 DIAGNOSIS — E1122 Type 2 diabetes mellitus with diabetic chronic kidney disease: Secondary | ICD-10-CM | POA: Diagnosis not present

## 2020-07-07 DIAGNOSIS — Z7982 Long term (current) use of aspirin: Secondary | ICD-10-CM | POA: Diagnosis not present

## 2020-07-07 DIAGNOSIS — E1122 Type 2 diabetes mellitus with diabetic chronic kidney disease: Secondary | ICD-10-CM | POA: Diagnosis not present

## 2020-07-07 DIAGNOSIS — N182 Chronic kidney disease, stage 2 (mild): Secondary | ICD-10-CM | POA: Diagnosis not present

## 2020-07-07 DIAGNOSIS — U071 COVID-19: Secondary | ICD-10-CM | POA: Diagnosis not present

## 2020-07-07 DIAGNOSIS — I129 Hypertensive chronic kidney disease with stage 1 through stage 4 chronic kidney disease, or unspecified chronic kidney disease: Secondary | ICD-10-CM | POA: Diagnosis not present

## 2020-07-07 DIAGNOSIS — Z794 Long term (current) use of insulin: Secondary | ICD-10-CM | POA: Diagnosis not present

## 2020-07-07 DIAGNOSIS — I4892 Unspecified atrial flutter: Secondary | ICD-10-CM | POA: Diagnosis not present

## 2020-07-07 DIAGNOSIS — E871 Hypo-osmolality and hyponatremia: Secondary | ICD-10-CM | POA: Diagnosis not present

## 2020-07-08 ENCOUNTER — Other Ambulatory Visit (HOSPITAL_COMMUNITY)
Admission: RE | Admit: 2020-07-08 | Discharge: 2020-07-08 | Disposition: A | Discharge status: Home / Self Care | Payer: Medicare HMO | Source: Skilled Nursing Facility | Attending: Internal Medicine | Admitting: Internal Medicine

## 2020-07-08 DIAGNOSIS — I4892 Unspecified atrial flutter: Secondary | ICD-10-CM | POA: Diagnosis not present

## 2020-07-08 DIAGNOSIS — E871 Hypo-osmolality and hyponatremia: Secondary | ICD-10-CM | POA: Diagnosis not present

## 2020-07-08 DIAGNOSIS — Z7982 Long term (current) use of aspirin: Secondary | ICD-10-CM | POA: Diagnosis not present

## 2020-07-08 DIAGNOSIS — E1122 Type 2 diabetes mellitus with diabetic chronic kidney disease: Secondary | ICD-10-CM | POA: Diagnosis not present

## 2020-07-08 DIAGNOSIS — N182 Chronic kidney disease, stage 2 (mild): Secondary | ICD-10-CM | POA: Diagnosis not present

## 2020-07-08 DIAGNOSIS — U071 COVID-19: Secondary | ICD-10-CM | POA: Diagnosis not present

## 2020-07-08 DIAGNOSIS — I129 Hypertensive chronic kidney disease with stage 1 through stage 4 chronic kidney disease, or unspecified chronic kidney disease: Secondary | ICD-10-CM | POA: Diagnosis not present

## 2020-07-08 DIAGNOSIS — Z794 Long term (current) use of insulin: Secondary | ICD-10-CM | POA: Diagnosis not present

## 2020-07-08 LAB — BASIC METABOLIC PANEL
Anion gap: 9 (ref 5–15)
BUN: 26 mg/dL — ABNORMAL HIGH (ref 8–23)
CO2: 26 mmol/L (ref 22–32)
Calcium: 9.3 mg/dL (ref 8.9–10.3)
Chloride: 107 mmol/L (ref 98–111)
Creatinine, Ser: 1.03 mg/dL — ABNORMAL HIGH (ref 0.44–1.00)
GFR, Estimated: 53 mL/min — ABNORMAL LOW (ref >60)
Glucose, Bld: 128 mg/dL — ABNORMAL HIGH (ref 70–99)
Potassium: 3.9 mmol/L (ref 3.5–5.1)
Sodium: 142 mmol/L (ref 135–145)

## 2020-07-09 DIAGNOSIS — Z7982 Long term (current) use of aspirin: Secondary | ICD-10-CM | POA: Diagnosis not present

## 2020-07-09 DIAGNOSIS — U071 COVID-19: Secondary | ICD-10-CM | POA: Diagnosis not present

## 2020-07-09 DIAGNOSIS — Z794 Long term (current) use of insulin: Secondary | ICD-10-CM | POA: Diagnosis not present

## 2020-07-09 DIAGNOSIS — I4892 Unspecified atrial flutter: Secondary | ICD-10-CM | POA: Diagnosis not present

## 2020-07-09 DIAGNOSIS — E871 Hypo-osmolality and hyponatremia: Secondary | ICD-10-CM | POA: Diagnosis not present

## 2020-07-09 DIAGNOSIS — I129 Hypertensive chronic kidney disease with stage 1 through stage 4 chronic kidney disease, or unspecified chronic kidney disease: Secondary | ICD-10-CM | POA: Diagnosis not present

## 2020-07-09 DIAGNOSIS — N182 Chronic kidney disease, stage 2 (mild): Secondary | ICD-10-CM | POA: Diagnosis not present

## 2020-07-09 DIAGNOSIS — E1122 Type 2 diabetes mellitus with diabetic chronic kidney disease: Secondary | ICD-10-CM | POA: Diagnosis not present

## 2020-07-15 DIAGNOSIS — I4892 Unspecified atrial flutter: Secondary | ICD-10-CM | POA: Diagnosis not present

## 2020-07-15 DIAGNOSIS — E1122 Type 2 diabetes mellitus with diabetic chronic kidney disease: Secondary | ICD-10-CM | POA: Diagnosis not present

## 2020-07-15 DIAGNOSIS — Z794 Long term (current) use of insulin: Secondary | ICD-10-CM | POA: Diagnosis not present

## 2020-07-15 DIAGNOSIS — E871 Hypo-osmolality and hyponatremia: Secondary | ICD-10-CM | POA: Diagnosis not present

## 2020-07-15 DIAGNOSIS — I129 Hypertensive chronic kidney disease with stage 1 through stage 4 chronic kidney disease, or unspecified chronic kidney disease: Secondary | ICD-10-CM | POA: Diagnosis not present

## 2020-07-15 DIAGNOSIS — N182 Chronic kidney disease, stage 2 (mild): Secondary | ICD-10-CM | POA: Diagnosis not present

## 2020-07-15 DIAGNOSIS — U071 COVID-19: Secondary | ICD-10-CM | POA: Diagnosis not present

## 2020-07-15 DIAGNOSIS — Z7982 Long term (current) use of aspirin: Secondary | ICD-10-CM | POA: Diagnosis not present

## 2020-07-17 DIAGNOSIS — Z7982 Long term (current) use of aspirin: Secondary | ICD-10-CM | POA: Diagnosis not present

## 2020-07-17 DIAGNOSIS — N182 Chronic kidney disease, stage 2 (mild): Secondary | ICD-10-CM | POA: Diagnosis not present

## 2020-07-17 DIAGNOSIS — I129 Hypertensive chronic kidney disease with stage 1 through stage 4 chronic kidney disease, or unspecified chronic kidney disease: Secondary | ICD-10-CM | POA: Diagnosis not present

## 2020-07-17 DIAGNOSIS — U071 COVID-19: Secondary | ICD-10-CM | POA: Diagnosis not present

## 2020-07-17 DIAGNOSIS — I4892 Unspecified atrial flutter: Secondary | ICD-10-CM | POA: Diagnosis not present

## 2020-07-17 DIAGNOSIS — Z794 Long term (current) use of insulin: Secondary | ICD-10-CM | POA: Diagnosis not present

## 2020-07-17 DIAGNOSIS — E871 Hypo-osmolality and hyponatremia: Secondary | ICD-10-CM | POA: Diagnosis not present

## 2020-07-17 DIAGNOSIS — E1122 Type 2 diabetes mellitus with diabetic chronic kidney disease: Secondary | ICD-10-CM | POA: Diagnosis not present

## 2020-07-21 DIAGNOSIS — I4892 Unspecified atrial flutter: Secondary | ICD-10-CM | POA: Diagnosis not present

## 2020-07-21 DIAGNOSIS — N182 Chronic kidney disease, stage 2 (mild): Secondary | ICD-10-CM | POA: Diagnosis not present

## 2020-07-21 DIAGNOSIS — E871 Hypo-osmolality and hyponatremia: Secondary | ICD-10-CM | POA: Diagnosis not present

## 2020-07-21 DIAGNOSIS — E1122 Type 2 diabetes mellitus with diabetic chronic kidney disease: Secondary | ICD-10-CM | POA: Diagnosis not present

## 2020-07-21 DIAGNOSIS — U071 COVID-19: Secondary | ICD-10-CM | POA: Diagnosis not present

## 2020-07-21 DIAGNOSIS — Z794 Long term (current) use of insulin: Secondary | ICD-10-CM | POA: Diagnosis not present

## 2020-07-21 DIAGNOSIS — Z7982 Long term (current) use of aspirin: Secondary | ICD-10-CM | POA: Diagnosis not present

## 2020-07-21 DIAGNOSIS — I129 Hypertensive chronic kidney disease with stage 1 through stage 4 chronic kidney disease, or unspecified chronic kidney disease: Secondary | ICD-10-CM | POA: Diagnosis not present

## 2020-07-22 DIAGNOSIS — Z7982 Long term (current) use of aspirin: Secondary | ICD-10-CM | POA: Diagnosis not present

## 2020-07-22 DIAGNOSIS — N182 Chronic kidney disease, stage 2 (mild): Secondary | ICD-10-CM | POA: Diagnosis not present

## 2020-07-22 DIAGNOSIS — Z794 Long term (current) use of insulin: Secondary | ICD-10-CM | POA: Diagnosis not present

## 2020-07-22 DIAGNOSIS — E1122 Type 2 diabetes mellitus with diabetic chronic kidney disease: Secondary | ICD-10-CM | POA: Diagnosis not present

## 2020-07-22 DIAGNOSIS — U071 COVID-19: Secondary | ICD-10-CM | POA: Diagnosis not present

## 2020-07-22 DIAGNOSIS — I4892 Unspecified atrial flutter: Secondary | ICD-10-CM | POA: Diagnosis not present

## 2020-07-22 DIAGNOSIS — E871 Hypo-osmolality and hyponatremia: Secondary | ICD-10-CM | POA: Diagnosis not present

## 2020-07-22 DIAGNOSIS — I129 Hypertensive chronic kidney disease with stage 1 through stage 4 chronic kidney disease, or unspecified chronic kidney disease: Secondary | ICD-10-CM | POA: Diagnosis not present

## 2020-07-23 DIAGNOSIS — E871 Hypo-osmolality and hyponatremia: Secondary | ICD-10-CM | POA: Diagnosis not present

## 2020-07-23 DIAGNOSIS — U071 COVID-19: Secondary | ICD-10-CM | POA: Diagnosis not present

## 2020-07-23 DIAGNOSIS — I4892 Unspecified atrial flutter: Secondary | ICD-10-CM | POA: Diagnosis not present

## 2020-07-23 DIAGNOSIS — I129 Hypertensive chronic kidney disease with stage 1 through stage 4 chronic kidney disease, or unspecified chronic kidney disease: Secondary | ICD-10-CM | POA: Diagnosis not present

## 2020-07-23 DIAGNOSIS — Z794 Long term (current) use of insulin: Secondary | ICD-10-CM | POA: Diagnosis not present

## 2020-07-23 DIAGNOSIS — E1122 Type 2 diabetes mellitus with diabetic chronic kidney disease: Secondary | ICD-10-CM | POA: Diagnosis not present

## 2020-07-23 DIAGNOSIS — Z7982 Long term (current) use of aspirin: Secondary | ICD-10-CM | POA: Diagnosis not present

## 2020-07-23 DIAGNOSIS — N182 Chronic kidney disease, stage 2 (mild): Secondary | ICD-10-CM | POA: Diagnosis not present

## 2020-07-24 DIAGNOSIS — J189 Pneumonia, unspecified organism: Secondary | ICD-10-CM | POA: Diagnosis not present

## 2020-07-24 DIAGNOSIS — E871 Hypo-osmolality and hyponatremia: Secondary | ICD-10-CM | POA: Diagnosis not present

## 2020-07-24 DIAGNOSIS — Z7982 Long term (current) use of aspirin: Secondary | ICD-10-CM | POA: Diagnosis not present

## 2020-07-24 DIAGNOSIS — I4892 Unspecified atrial flutter: Secondary | ICD-10-CM | POA: Diagnosis not present

## 2020-07-24 DIAGNOSIS — Z794 Long term (current) use of insulin: Secondary | ICD-10-CM | POA: Diagnosis not present

## 2020-07-24 DIAGNOSIS — Z20822 Contact with and (suspected) exposure to covid-19: Secondary | ICD-10-CM | POA: Diagnosis not present

## 2020-07-24 DIAGNOSIS — E1122 Type 2 diabetes mellitus with diabetic chronic kidney disease: Secondary | ICD-10-CM | POA: Diagnosis not present

## 2020-07-24 DIAGNOSIS — U071 COVID-19: Secondary | ICD-10-CM | POA: Diagnosis not present

## 2020-07-24 DIAGNOSIS — I129 Hypertensive chronic kidney disease with stage 1 through stage 4 chronic kidney disease, or unspecified chronic kidney disease: Secondary | ICD-10-CM | POA: Diagnosis not present

## 2020-07-24 DIAGNOSIS — R0600 Dyspnea, unspecified: Secondary | ICD-10-CM | POA: Diagnosis not present

## 2020-07-24 DIAGNOSIS — N182 Chronic kidney disease, stage 2 (mild): Secondary | ICD-10-CM | POA: Diagnosis not present

## 2020-07-24 DIAGNOSIS — R059 Cough, unspecified: Secondary | ICD-10-CM | POA: Diagnosis not present

## 2020-07-28 DIAGNOSIS — Z7982 Long term (current) use of aspirin: Secondary | ICD-10-CM | POA: Diagnosis not present

## 2020-07-28 DIAGNOSIS — E871 Hypo-osmolality and hyponatremia: Secondary | ICD-10-CM | POA: Diagnosis not present

## 2020-07-28 DIAGNOSIS — Z794 Long term (current) use of insulin: Secondary | ICD-10-CM | POA: Diagnosis not present

## 2020-07-28 DIAGNOSIS — U071 COVID-19: Secondary | ICD-10-CM | POA: Diagnosis not present

## 2020-07-28 DIAGNOSIS — I129 Hypertensive chronic kidney disease with stage 1 through stage 4 chronic kidney disease, or unspecified chronic kidney disease: Secondary | ICD-10-CM | POA: Diagnosis not present

## 2020-07-28 DIAGNOSIS — N182 Chronic kidney disease, stage 2 (mild): Secondary | ICD-10-CM | POA: Diagnosis not present

## 2020-07-28 DIAGNOSIS — E1122 Type 2 diabetes mellitus with diabetic chronic kidney disease: Secondary | ICD-10-CM | POA: Diagnosis not present

## 2020-07-28 DIAGNOSIS — I4892 Unspecified atrial flutter: Secondary | ICD-10-CM | POA: Diagnosis not present

## 2020-07-30 DIAGNOSIS — Z7982 Long term (current) use of aspirin: Secondary | ICD-10-CM | POA: Diagnosis not present

## 2020-07-30 DIAGNOSIS — I129 Hypertensive chronic kidney disease with stage 1 through stage 4 chronic kidney disease, or unspecified chronic kidney disease: Secondary | ICD-10-CM | POA: Diagnosis not present

## 2020-07-30 DIAGNOSIS — I4892 Unspecified atrial flutter: Secondary | ICD-10-CM | POA: Diagnosis not present

## 2020-07-30 DIAGNOSIS — E1122 Type 2 diabetes mellitus with diabetic chronic kidney disease: Secondary | ICD-10-CM | POA: Diagnosis not present

## 2020-07-30 DIAGNOSIS — E871 Hypo-osmolality and hyponatremia: Secondary | ICD-10-CM | POA: Diagnosis not present

## 2020-07-30 DIAGNOSIS — Z794 Long term (current) use of insulin: Secondary | ICD-10-CM | POA: Diagnosis not present

## 2020-07-30 DIAGNOSIS — U071 COVID-19: Secondary | ICD-10-CM | POA: Diagnosis not present

## 2020-07-30 DIAGNOSIS — N182 Chronic kidney disease, stage 2 (mild): Secondary | ICD-10-CM | POA: Diagnosis not present

## 2020-07-31 DIAGNOSIS — N182 Chronic kidney disease, stage 2 (mild): Secondary | ICD-10-CM | POA: Diagnosis not present

## 2020-07-31 DIAGNOSIS — Z794 Long term (current) use of insulin: Secondary | ICD-10-CM | POA: Diagnosis not present

## 2020-07-31 DIAGNOSIS — E1122 Type 2 diabetes mellitus with diabetic chronic kidney disease: Secondary | ICD-10-CM | POA: Diagnosis not present

## 2020-07-31 DIAGNOSIS — E871 Hypo-osmolality and hyponatremia: Secondary | ICD-10-CM | POA: Diagnosis not present

## 2020-07-31 DIAGNOSIS — U071 COVID-19: Secondary | ICD-10-CM | POA: Diagnosis not present

## 2020-07-31 DIAGNOSIS — I129 Hypertensive chronic kidney disease with stage 1 through stage 4 chronic kidney disease, or unspecified chronic kidney disease: Secondary | ICD-10-CM | POA: Diagnosis not present

## 2020-07-31 DIAGNOSIS — Z7982 Long term (current) use of aspirin: Secondary | ICD-10-CM | POA: Diagnosis not present

## 2020-07-31 DIAGNOSIS — I4892 Unspecified atrial flutter: Secondary | ICD-10-CM | POA: Diagnosis not present

## 2020-08-10 DIAGNOSIS — E1122 Type 2 diabetes mellitus with diabetic chronic kidney disease: Secondary | ICD-10-CM | POA: Diagnosis not present

## 2020-08-10 DIAGNOSIS — I129 Hypertensive chronic kidney disease with stage 1 through stage 4 chronic kidney disease, or unspecified chronic kidney disease: Secondary | ICD-10-CM | POA: Diagnosis not present

## 2020-08-10 DIAGNOSIS — E7849 Other hyperlipidemia: Secondary | ICD-10-CM | POA: Diagnosis not present

## 2020-08-14 DIAGNOSIS — M542 Cervicalgia: Secondary | ICD-10-CM | POA: Diagnosis not present

## 2020-08-17 DIAGNOSIS — M9901 Segmental and somatic dysfunction of cervical region: Secondary | ICD-10-CM | POA: Diagnosis not present

## 2020-08-17 DIAGNOSIS — M542 Cervicalgia: Secondary | ICD-10-CM | POA: Diagnosis not present

## 2020-08-17 DIAGNOSIS — M546 Pain in thoracic spine: Secondary | ICD-10-CM | POA: Diagnosis not present

## 2020-08-17 DIAGNOSIS — M9902 Segmental and somatic dysfunction of thoracic region: Secondary | ICD-10-CM | POA: Diagnosis not present

## 2020-08-19 DIAGNOSIS — M9902 Segmental and somatic dysfunction of thoracic region: Secondary | ICD-10-CM | POA: Diagnosis not present

## 2020-08-19 DIAGNOSIS — M546 Pain in thoracic spine: Secondary | ICD-10-CM | POA: Diagnosis not present

## 2020-08-19 DIAGNOSIS — M542 Cervicalgia: Secondary | ICD-10-CM | POA: Diagnosis not present

## 2020-08-19 DIAGNOSIS — M9901 Segmental and somatic dysfunction of cervical region: Secondary | ICD-10-CM | POA: Diagnosis not present

## 2020-08-24 DIAGNOSIS — M546 Pain in thoracic spine: Secondary | ICD-10-CM | POA: Diagnosis not present

## 2020-08-24 DIAGNOSIS — M9901 Segmental and somatic dysfunction of cervical region: Secondary | ICD-10-CM | POA: Diagnosis not present

## 2020-08-24 DIAGNOSIS — M542 Cervicalgia: Secondary | ICD-10-CM | POA: Diagnosis not present

## 2020-08-24 DIAGNOSIS — M9902 Segmental and somatic dysfunction of thoracic region: Secondary | ICD-10-CM | POA: Diagnosis not present

## 2020-08-28 DIAGNOSIS — M542 Cervicalgia: Secondary | ICD-10-CM | POA: Diagnosis not present

## 2020-08-28 DIAGNOSIS — M9902 Segmental and somatic dysfunction of thoracic region: Secondary | ICD-10-CM | POA: Diagnosis not present

## 2020-08-28 DIAGNOSIS — M9901 Segmental and somatic dysfunction of cervical region: Secondary | ICD-10-CM | POA: Diagnosis not present

## 2020-08-28 DIAGNOSIS — M546 Pain in thoracic spine: Secondary | ICD-10-CM | POA: Diagnosis not present

## 2020-08-31 DIAGNOSIS — M546 Pain in thoracic spine: Secondary | ICD-10-CM | POA: Diagnosis not present

## 2020-08-31 DIAGNOSIS — M9902 Segmental and somatic dysfunction of thoracic region: Secondary | ICD-10-CM | POA: Diagnosis not present

## 2020-08-31 DIAGNOSIS — M542 Cervicalgia: Secondary | ICD-10-CM | POA: Diagnosis not present

## 2020-08-31 DIAGNOSIS — M9901 Segmental and somatic dysfunction of cervical region: Secondary | ICD-10-CM | POA: Diagnosis not present

## 2020-09-02 DIAGNOSIS — H18513 Endothelial corneal dystrophy, bilateral: Secondary | ICD-10-CM | POA: Diagnosis not present

## 2020-09-02 DIAGNOSIS — Z947 Corneal transplant status: Secondary | ICD-10-CM | POA: Diagnosis not present

## 2020-09-04 DIAGNOSIS — M9901 Segmental and somatic dysfunction of cervical region: Secondary | ICD-10-CM | POA: Diagnosis not present

## 2020-09-04 DIAGNOSIS — M546 Pain in thoracic spine: Secondary | ICD-10-CM | POA: Diagnosis not present

## 2020-09-04 DIAGNOSIS — M542 Cervicalgia: Secondary | ICD-10-CM | POA: Diagnosis not present

## 2020-09-04 DIAGNOSIS — M9902 Segmental and somatic dysfunction of thoracic region: Secondary | ICD-10-CM | POA: Diagnosis not present

## 2020-09-09 DIAGNOSIS — E7849 Other hyperlipidemia: Secondary | ICD-10-CM | POA: Diagnosis not present

## 2020-09-09 DIAGNOSIS — M546 Pain in thoracic spine: Secondary | ICD-10-CM | POA: Diagnosis not present

## 2020-09-09 DIAGNOSIS — M9902 Segmental and somatic dysfunction of thoracic region: Secondary | ICD-10-CM | POA: Diagnosis not present

## 2020-09-09 DIAGNOSIS — I129 Hypertensive chronic kidney disease with stage 1 through stage 4 chronic kidney disease, or unspecified chronic kidney disease: Secondary | ICD-10-CM | POA: Diagnosis not present

## 2020-09-09 DIAGNOSIS — M542 Cervicalgia: Secondary | ICD-10-CM | POA: Diagnosis not present

## 2020-09-09 DIAGNOSIS — E1122 Type 2 diabetes mellitus with diabetic chronic kidney disease: Secondary | ICD-10-CM | POA: Diagnosis not present

## 2020-09-09 DIAGNOSIS — N183 Chronic kidney disease, stage 3 unspecified: Secondary | ICD-10-CM | POA: Diagnosis not present

## 2020-09-09 DIAGNOSIS — M9901 Segmental and somatic dysfunction of cervical region: Secondary | ICD-10-CM | POA: Diagnosis not present

## 2020-09-16 DIAGNOSIS — M546 Pain in thoracic spine: Secondary | ICD-10-CM | POA: Diagnosis not present

## 2020-09-16 DIAGNOSIS — M9902 Segmental and somatic dysfunction of thoracic region: Secondary | ICD-10-CM | POA: Diagnosis not present

## 2020-09-16 DIAGNOSIS — M9901 Segmental and somatic dysfunction of cervical region: Secondary | ICD-10-CM | POA: Diagnosis not present

## 2020-09-16 DIAGNOSIS — M542 Cervicalgia: Secondary | ICD-10-CM | POA: Diagnosis not present

## 2020-09-18 DIAGNOSIS — I10 Essential (primary) hypertension: Secondary | ICD-10-CM | POA: Diagnosis not present

## 2020-09-18 DIAGNOSIS — E7849 Other hyperlipidemia: Secondary | ICD-10-CM | POA: Diagnosis not present

## 2020-09-18 DIAGNOSIS — E870 Hyperosmolality and hypernatremia: Secondary | ICD-10-CM | POA: Diagnosis not present

## 2020-09-18 DIAGNOSIS — M179 Osteoarthritis of knee, unspecified: Secondary | ICD-10-CM | POA: Diagnosis not present

## 2020-09-18 DIAGNOSIS — I483 Typical atrial flutter: Secondary | ICD-10-CM | POA: Diagnosis not present

## 2020-09-18 DIAGNOSIS — E119 Type 2 diabetes mellitus without complications: Secondary | ICD-10-CM | POA: Diagnosis not present

## 2020-09-18 DIAGNOSIS — I129 Hypertensive chronic kidney disease with stage 1 through stage 4 chronic kidney disease, or unspecified chronic kidney disease: Secondary | ICD-10-CM | POA: Diagnosis not present

## 2020-09-18 DIAGNOSIS — N1831 Chronic kidney disease, stage 3a: Secondary | ICD-10-CM | POA: Diagnosis not present

## 2020-09-18 DIAGNOSIS — E1122 Type 2 diabetes mellitus with diabetic chronic kidney disease: Secondary | ICD-10-CM | POA: Diagnosis not present

## 2020-09-18 DIAGNOSIS — N182 Chronic kidney disease, stage 2 (mild): Secondary | ICD-10-CM | POA: Diagnosis not present

## 2020-09-23 ENCOUNTER — Other Ambulatory Visit: Payer: Self-pay

## 2020-09-23 ENCOUNTER — Other Ambulatory Visit (HOSPITAL_COMMUNITY): Payer: Self-pay | Admitting: Student

## 2020-09-23 ENCOUNTER — Ambulatory Visit (HOSPITAL_COMMUNITY)
Admission: RE | Admit: 2020-09-23 | Discharge: 2020-09-23 | Disposition: A | Discharge status: Home / Self Care | Payer: Medicare HMO | Source: Ambulatory Visit | Attending: Student | Admitting: Student

## 2020-09-23 DIAGNOSIS — M549 Dorsalgia, unspecified: Secondary | ICD-10-CM | POA: Insufficient documentation

## 2020-09-23 DIAGNOSIS — M546 Pain in thoracic spine: Secondary | ICD-10-CM | POA: Diagnosis not present

## 2020-09-23 DIAGNOSIS — E785 Hyperlipidemia, unspecified: Secondary | ICD-10-CM | POA: Diagnosis not present

## 2020-09-23 DIAGNOSIS — E1122 Type 2 diabetes mellitus with diabetic chronic kidney disease: Secondary | ICD-10-CM | POA: Diagnosis not present

## 2020-09-23 DIAGNOSIS — M542 Cervicalgia: Secondary | ICD-10-CM | POA: Insufficient documentation

## 2020-09-23 DIAGNOSIS — I129 Hypertensive chronic kidney disease with stage 1 through stage 4 chronic kidney disease, or unspecified chronic kidney disease: Secondary | ICD-10-CM | POA: Diagnosis not present

## 2020-09-23 DIAGNOSIS — M9901 Segmental and somatic dysfunction of cervical region: Secondary | ICD-10-CM | POA: Diagnosis not present

## 2020-09-23 DIAGNOSIS — M47814 Spondylosis without myelopathy or radiculopathy, thoracic region: Secondary | ICD-10-CM | POA: Diagnosis not present

## 2020-09-23 DIAGNOSIS — E870 Hyperosmolality and hypernatremia: Secondary | ICD-10-CM | POA: Diagnosis not present

## 2020-09-23 DIAGNOSIS — M4184 Other forms of scoliosis, thoracic region: Secondary | ICD-10-CM | POA: Diagnosis not present

## 2020-09-23 DIAGNOSIS — M9902 Segmental and somatic dysfunction of thoracic region: Secondary | ICD-10-CM | POA: Diagnosis not present

## 2020-09-23 DIAGNOSIS — M79642 Pain in left hand: Secondary | ICD-10-CM | POA: Diagnosis not present

## 2020-09-23 DIAGNOSIS — M47812 Spondylosis without myelopathy or radiculopathy, cervical region: Secondary | ICD-10-CM | POA: Diagnosis not present

## 2020-09-23 DIAGNOSIS — M4802 Spinal stenosis, cervical region: Secondary | ICD-10-CM | POA: Diagnosis not present

## 2020-09-29 DIAGNOSIS — E1122 Type 2 diabetes mellitus with diabetic chronic kidney disease: Secondary | ICD-10-CM | POA: Diagnosis not present

## 2020-09-30 DIAGNOSIS — M546 Pain in thoracic spine: Secondary | ICD-10-CM | POA: Diagnosis not present

## 2020-09-30 DIAGNOSIS — M9901 Segmental and somatic dysfunction of cervical region: Secondary | ICD-10-CM | POA: Diagnosis not present

## 2020-09-30 DIAGNOSIS — M9902 Segmental and somatic dysfunction of thoracic region: Secondary | ICD-10-CM | POA: Diagnosis not present

## 2020-09-30 DIAGNOSIS — M542 Cervicalgia: Secondary | ICD-10-CM | POA: Diagnosis not present

## 2020-10-14 DIAGNOSIS — M546 Pain in thoracic spine: Secondary | ICD-10-CM | POA: Diagnosis not present

## 2020-10-14 DIAGNOSIS — M9902 Segmental and somatic dysfunction of thoracic region: Secondary | ICD-10-CM | POA: Diagnosis not present

## 2020-10-14 DIAGNOSIS — M542 Cervicalgia: Secondary | ICD-10-CM | POA: Diagnosis not present

## 2020-10-14 DIAGNOSIS — M9901 Segmental and somatic dysfunction of cervical region: Secondary | ICD-10-CM | POA: Diagnosis not present

## 2020-10-28 DIAGNOSIS — M9901 Segmental and somatic dysfunction of cervical region: Secondary | ICD-10-CM | POA: Diagnosis not present

## 2020-10-28 DIAGNOSIS — M546 Pain in thoracic spine: Secondary | ICD-10-CM | POA: Diagnosis not present

## 2020-10-28 DIAGNOSIS — M542 Cervicalgia: Secondary | ICD-10-CM | POA: Diagnosis not present

## 2020-10-28 DIAGNOSIS — M9902 Segmental and somatic dysfunction of thoracic region: Secondary | ICD-10-CM | POA: Diagnosis not present

## 2020-11-05 DIAGNOSIS — E1151 Type 2 diabetes mellitus with diabetic peripheral angiopathy without gangrene: Secondary | ICD-10-CM | POA: Diagnosis not present

## 2020-11-05 DIAGNOSIS — L851 Acquired keratosis [keratoderma] palmaris et plantaris: Secondary | ICD-10-CM | POA: Diagnosis not present

## 2020-11-05 DIAGNOSIS — B351 Tinea unguium: Secondary | ICD-10-CM | POA: Diagnosis not present

## 2020-11-11 DIAGNOSIS — M9901 Segmental and somatic dysfunction of cervical region: Secondary | ICD-10-CM | POA: Diagnosis not present

## 2020-11-11 DIAGNOSIS — M542 Cervicalgia: Secondary | ICD-10-CM | POA: Diagnosis not present

## 2020-11-11 DIAGNOSIS — M546 Pain in thoracic spine: Secondary | ICD-10-CM | POA: Diagnosis not present

## 2020-11-11 DIAGNOSIS — M9902 Segmental and somatic dysfunction of thoracic region: Secondary | ICD-10-CM | POA: Diagnosis not present

## 2020-12-10 ENCOUNTER — Other Ambulatory Visit: Payer: Self-pay

## 2020-12-10 ENCOUNTER — Ambulatory Visit: Payer: Medicare HMO | Admitting: Cardiology

## 2020-12-10 ENCOUNTER — Encounter: Payer: Self-pay | Admitting: Cardiology

## 2020-12-10 VITALS — BP 136/68 | HR 65 | Ht 66.5 in | Wt 177.0 lb

## 2020-12-10 DIAGNOSIS — I10 Essential (primary) hypertension: Secondary | ICD-10-CM | POA: Diagnosis not present

## 2020-12-10 DIAGNOSIS — I4892 Unspecified atrial flutter: Secondary | ICD-10-CM | POA: Diagnosis not present

## 2020-12-10 DIAGNOSIS — E1165 Type 2 diabetes mellitus with hyperglycemia: Secondary | ICD-10-CM | POA: Diagnosis not present

## 2020-12-10 NOTE — Progress Notes (Signed)
Cardiology Office Note  Date: 12/10/2020   ID: Audrey Walker, DOB February 06, 1933, MRN [id number]  PCP:  Celene Squibb, MD  Cardiologist:  Rozann Lesches, MD Electrophysiologist:  None   Chief Complaint  Patient presents with   Cardiac follow-up    History of Present Illness: Audrey Walker is an 87 y.o. female last seen in April 2021.  She is here for a follow-up visit.  She does not report any progressive sense of palpitations, no regular exertional chest pain although she did have a few sharp chest twinges several weeks ago without obvious provocation.  This has resolved.  I see that she was hospitalized with COVID-19 late last year, she states that she has recovered from this completely.  CHA2DS2-VASc score is 5.  She has consistently declined anticoagulation.  She is only using aspirin as needed for aches and pains.  I personally reviewed her ECG today which shows normal sinus rhythm.  She remains on atenolol and Vasotec as noted below.  Past Medical History:  Diagnosis Date   Atrial flutter (HCC)    CKD (chronic kidney disease) stage 2, GFR 60-89 ml/min    Essential hypertension, benign    Type 2 diabetes mellitus ([place])     History reviewed. No pertinent surgical history.  Current Outpatient Medications  Medication Sig Dispense Refill   acetaminophen (TYLENOL) 500 MG tablet Take 500 mg by mouth every 6 (six) hours as needed for mild pain.     Ascorbic Acid (VITAMIN C) 1000 MG tablet Take 2,000 mg by mouth daily.     atenolol (TENORMIN) 50 MG tablet Take 1 tablet (50 mg total) by mouth daily. 90 tablet 3   B-D ULTRAFINE III SHORT PEN 31G X 8 MM MISC Inject 1 Syringe into the skin daily.     Blood Glucose Monitoring Suppl (ONE TOUCH ULTRA 2) w/Device KIT      Cholecalciferol (VITAMIN D3) 1.25 MG (50000 UT) CAPS Take 1 capsule by mouth daily.     Cyanocobalamin (VITAMIN B 12 PO) Take 1 tablet by mouth daily.      enalapril (VASOTEC) 20 MG tablet Take 20 mg by mouth daily.      fish oil-omega-3 fatty acids 1000 MG capsule Take 1 g by mouth daily.     milk thistle 175 MG tablet Take 175 mg by mouth daily.     TRESIBA FLEXTOUCH 100 UNIT/ML FlexTouch Pen Inject 12 Units into the skin daily.     White Petrolatum-Mineral Oil (ARTIFICIAL TEARS) ointment Place 2 drops into both eyes 3 (three) times daily.     No current facility-administered medications for this visit.   Allergies:  Nsaids   ROS: No dizziness or syncope.  Physical Exam: VS:  BP 136/68   Pulse 65   Ht 5' 6.5" (1.689 m)   Wt 177 lb (80.3 kg)   SpO2 95%   BMI 28.14 kg/m , BMI Body mass index is 28.14 kg/m.  Wt Readings from Last 3 Encounters:  12/10/20 177 lb (80.3 kg)  06/01/20 191 lb 5.8 oz (86.8 kg)  10/11/19 183 lb 6.4 oz (83.2 kg)    General: Patient appears comfortable at rest. HEENT: Conjunctiva and lids normal, wearing a mask. Neck: Supple, no elevated JVP or carotid bruits, no thyromegaly. Lungs: Clear to auscultation, nonlabored breathing at rest. Cardiac: Regular rate and rhythm, no S3 or significant systolic murmur, no pericardial rub. Extremities: No pitting edema.  ECG:  An ECG dated 06/01/2020 was personally reviewed today and  demonstrated:  Sinus rhythm with PACs and low voltage.  Recent Labwork: 06/01/2020: TSH 0.768 06/02/2020: ALT 42; AST 82; Hemoglobin 10.9; Platelets 101 06/03/2020: Magnesium 2.1 07/08/2020: BUN 26; Creatinine, Ser 1.03; Potassium 3.9; Sodium 142   Other Studies Reviewed Today:  No interval cardiac testing for review today.  Assessment and Plan:  1.  Paroxysmal atrial flutter with CHA2DS2-VASc score of 5.  She has consistently declined anticoagulation, symptomatically stable without recurring palpitations and in sinus rhythm by ECG today.  Continue atenolol.  2.  Essential hypertension, blood pressure control is adequate today.  Continue Vasotec and follow-up with Dr. Nevada Crane.  Medication Adjustments/Labs and Tests Ordered: Current medicines are  reviewed at length with the patient today.  Concerns regarding medicines are outlined above.   Tests Ordered: Orders Placed This Encounter  Procedures   EKG 12-Lead    Medication Changes: No orders of the defined types were placed in this encounter.   Disposition:  Follow up  1 year.  Signed, Satira Sark, MD, [facility] 12/10/2020 9:33 AM    [facility] [place]. [address] [place], [place] [address] Phone: [phone]; Fax: [phone]

## 2020-12-10 NOTE — Patient Instructions (Signed)
Medication Instructions:  Your physician recommends that you continue on your current medications as directed. Please refer to the Current Medication list given to you today.   *If you need a refill on your cardiac medications before your next appointment, please call your pharmacy*   Lab Work: None today  If you have labs (blood work) drawn today and your tests are completely normal, you will receive your results only by: [place] (if you have MyChart) OR A paper copy in the mail If you have any lab test that is abnormal or we need to change your treatment, we will call you to review the results.   Testing/Procedures: None today    Follow-Up: At [facility], you and your health needs are our priority.  As part of our continuing mission to provide you with exceptional heart care, we have created designated Provider Care Teams.  These Care Teams include your primary Cardiologist (physician) and Advanced Practice Providers (APPs -  Physician Assistants and Nurse Practitioners) who all work together to provide you with the care you need, when you need it.  We recommend signing up for the patient portal called "MyChart".  Sign up information is provided on this After Visit Summary.  MyChart is used to connect with patients for Virtual Visits (Telemedicine).  Patients are able to view lab/test results, encounter notes, upcoming appointments, etc.  Non-urgent messages can be sent to your provider as well.   To learn more about what you can do with MyChart, go to [email or web address].    Your next appointment:   1 year(s)  The format for your next appointment:   In Person  Provider:   Rozann Lesches, MD   Other Instructions None

## 2020-12-21 DIAGNOSIS — H903 Sensorineural hearing loss, bilateral: Secondary | ICD-10-CM | POA: Diagnosis not present

## 2020-12-24 DIAGNOSIS — I10 Essential (primary) hypertension: Secondary | ICD-10-CM | POA: Diagnosis not present

## 2020-12-24 DIAGNOSIS — E559 Vitamin D deficiency, unspecified: Secondary | ICD-10-CM | POA: Diagnosis not present

## 2020-12-24 DIAGNOSIS — E1122 Type 2 diabetes mellitus with diabetic chronic kidney disease: Secondary | ICD-10-CM | POA: Diagnosis not present

## 2020-12-30 DIAGNOSIS — I482 Chronic atrial fibrillation, unspecified: Secondary | ICD-10-CM | POA: Diagnosis not present

## 2020-12-30 DIAGNOSIS — E870 Hyperosmolality and hypernatremia: Secondary | ICD-10-CM | POA: Diagnosis not present

## 2020-12-30 DIAGNOSIS — M542 Cervicalgia: Secondary | ICD-10-CM | POA: Diagnosis not present

## 2020-12-30 DIAGNOSIS — N1831 Chronic kidney disease, stage 3a: Secondary | ICD-10-CM | POA: Diagnosis not present

## 2020-12-30 DIAGNOSIS — E1122 Type 2 diabetes mellitus with diabetic chronic kidney disease: Secondary | ICD-10-CM | POA: Diagnosis not present

## 2020-12-30 DIAGNOSIS — I10 Essential (primary) hypertension: Secondary | ICD-10-CM | POA: Diagnosis not present

## 2021-01-14 DIAGNOSIS — E1151 Type 2 diabetes mellitus with diabetic peripheral angiopathy without gangrene: Secondary | ICD-10-CM | POA: Diagnosis not present

## 2021-01-14 DIAGNOSIS — B351 Tinea unguium: Secondary | ICD-10-CM | POA: Diagnosis not present

## 2021-01-14 DIAGNOSIS — L851 Acquired keratosis [keratoderma] palmaris et plantaris: Secondary | ICD-10-CM | POA: Diagnosis not present

## 2021-01-28 DIAGNOSIS — E1122 Type 2 diabetes mellitus with diabetic chronic kidney disease: Secondary | ICD-10-CM | POA: Diagnosis not present

## 2021-02-22 DIAGNOSIS — M9901 Segmental and somatic dysfunction of cervical region: Secondary | ICD-10-CM | POA: Diagnosis not present

## 2021-02-22 DIAGNOSIS — M50322 Other cervical disc degeneration at C5-C6 level: Secondary | ICD-10-CM | POA: Diagnosis not present

## 2021-02-24 DIAGNOSIS — M50322 Other cervical disc degeneration at C5-C6 level: Secondary | ICD-10-CM | POA: Diagnosis not present

## 2021-02-24 DIAGNOSIS — M9901 Segmental and somatic dysfunction of cervical region: Secondary | ICD-10-CM | POA: Diagnosis not present

## 2021-02-26 DIAGNOSIS — M50322 Other cervical disc degeneration at C5-C6 level: Secondary | ICD-10-CM | POA: Diagnosis not present

## 2021-02-26 DIAGNOSIS — M9901 Segmental and somatic dysfunction of cervical region: Secondary | ICD-10-CM | POA: Diagnosis not present

## 2021-03-01 DIAGNOSIS — M50322 Other cervical disc degeneration at C5-C6 level: Secondary | ICD-10-CM | POA: Diagnosis not present

## 2021-03-01 DIAGNOSIS — M9901 Segmental and somatic dysfunction of cervical region: Secondary | ICD-10-CM | POA: Diagnosis not present

## 2021-03-05 DIAGNOSIS — M50322 Other cervical disc degeneration at C5-C6 level: Secondary | ICD-10-CM | POA: Diagnosis not present

## 2021-03-05 DIAGNOSIS — M9901 Segmental and somatic dysfunction of cervical region: Secondary | ICD-10-CM | POA: Diagnosis not present

## 2021-03-12 DIAGNOSIS — I10 Essential (primary) hypertension: Secondary | ICD-10-CM | POA: Diagnosis not present

## 2021-03-12 DIAGNOSIS — E1165 Type 2 diabetes mellitus with hyperglycemia: Secondary | ICD-10-CM | POA: Diagnosis not present

## 2021-03-17 DIAGNOSIS — M9901 Segmental and somatic dysfunction of cervical region: Secondary | ICD-10-CM | POA: Diagnosis not present

## 2021-03-17 DIAGNOSIS — M50322 Other cervical disc degeneration at C5-C6 level: Secondary | ICD-10-CM | POA: Diagnosis not present

## 2021-03-24 DIAGNOSIS — M50322 Other cervical disc degeneration at C5-C6 level: Secondary | ICD-10-CM | POA: Diagnosis not present

## 2021-03-24 DIAGNOSIS — M9901 Segmental and somatic dysfunction of cervical region: Secondary | ICD-10-CM | POA: Diagnosis not present

## 2021-03-25 DIAGNOSIS — L851 Acquired keratosis [keratoderma] palmaris et plantaris: Secondary | ICD-10-CM | POA: Diagnosis not present

## 2021-03-25 DIAGNOSIS — B351 Tinea unguium: Secondary | ICD-10-CM | POA: Diagnosis not present

## 2021-03-25 DIAGNOSIS — E1151 Type 2 diabetes mellitus with diabetic peripheral angiopathy without gangrene: Secondary | ICD-10-CM | POA: Diagnosis not present

## 2021-03-31 DIAGNOSIS — M9901 Segmental and somatic dysfunction of cervical region: Secondary | ICD-10-CM | POA: Diagnosis not present

## 2021-03-31 DIAGNOSIS — M50322 Other cervical disc degeneration at C5-C6 level: Secondary | ICD-10-CM | POA: Diagnosis not present

## 2021-04-01 DIAGNOSIS — E1122 Type 2 diabetes mellitus with diabetic chronic kidney disease: Secondary | ICD-10-CM | POA: Diagnosis not present

## 2021-04-01 DIAGNOSIS — I10 Essential (primary) hypertension: Secondary | ICD-10-CM | POA: Diagnosis not present

## 2021-04-07 DIAGNOSIS — M50322 Other cervical disc degeneration at C5-C6 level: Secondary | ICD-10-CM | POA: Diagnosis not present

## 2021-04-07 DIAGNOSIS — M9901 Segmental and somatic dysfunction of cervical region: Secondary | ICD-10-CM | POA: Diagnosis not present

## 2021-04-12 DIAGNOSIS — E870 Hyperosmolality and hypernatremia: Secondary | ICD-10-CM | POA: Diagnosis not present

## 2021-04-12 DIAGNOSIS — E1122 Type 2 diabetes mellitus with diabetic chronic kidney disease: Secondary | ICD-10-CM | POA: Diagnosis not present

## 2021-04-12 DIAGNOSIS — E875 Hyperkalemia: Secondary | ICD-10-CM | POA: Diagnosis not present

## 2021-04-12 DIAGNOSIS — E785 Hyperlipidemia, unspecified: Secondary | ICD-10-CM | POA: Diagnosis not present

## 2021-04-12 DIAGNOSIS — N1831 Chronic kidney disease, stage 3a: Secondary | ICD-10-CM | POA: Diagnosis not present

## 2021-04-12 DIAGNOSIS — I10 Essential (primary) hypertension: Secondary | ICD-10-CM | POA: Diagnosis not present

## 2021-04-12 DIAGNOSIS — I482 Chronic atrial fibrillation, unspecified: Secondary | ICD-10-CM | POA: Diagnosis not present

## 2021-05-11 DIAGNOSIS — E1122 Type 2 diabetes mellitus with diabetic chronic kidney disease: Secondary | ICD-10-CM | POA: Diagnosis not present

## 2021-06-09 DIAGNOSIS — H5200 Hypermetropia, unspecified eye: Secondary | ICD-10-CM | POA: Diagnosis not present

## 2021-06-11 DIAGNOSIS — E782 Mixed hyperlipidemia: Secondary | ICD-10-CM | POA: Diagnosis not present

## 2021-06-11 DIAGNOSIS — E1122 Type 2 diabetes mellitus with diabetic chronic kidney disease: Secondary | ICD-10-CM | POA: Diagnosis not present

## 2021-06-11 DIAGNOSIS — I10 Essential (primary) hypertension: Secondary | ICD-10-CM | POA: Diagnosis not present

## 2021-06-21 DIAGNOSIS — R1310 Dysphagia, unspecified: Secondary | ICD-10-CM | POA: Diagnosis not present

## 2021-06-21 DIAGNOSIS — E785 Hyperlipidemia, unspecified: Secondary | ICD-10-CM | POA: Diagnosis not present

## 2021-06-21 DIAGNOSIS — I482 Chronic atrial fibrillation, unspecified: Secondary | ICD-10-CM | POA: Diagnosis not present

## 2021-06-21 DIAGNOSIS — E870 Hyperosmolality and hypernatremia: Secondary | ICD-10-CM | POA: Diagnosis not present

## 2021-06-21 DIAGNOSIS — I10 Essential (primary) hypertension: Secondary | ICD-10-CM | POA: Diagnosis not present

## 2021-06-21 DIAGNOSIS — N1831 Chronic kidney disease, stage 3a: Secondary | ICD-10-CM | POA: Diagnosis not present

## 2021-06-21 DIAGNOSIS — E1122 Type 2 diabetes mellitus with diabetic chronic kidney disease: Secondary | ICD-10-CM | POA: Diagnosis not present

## 2021-06-21 DIAGNOSIS — E875 Hyperkalemia: Secondary | ICD-10-CM | POA: Diagnosis not present

## 2021-07-19 DIAGNOSIS — B351 Tinea unguium: Secondary | ICD-10-CM | POA: Diagnosis not present

## 2021-07-19 DIAGNOSIS — E1151 Type 2 diabetes mellitus with diabetic peripheral angiopathy without gangrene: Secondary | ICD-10-CM | POA: Diagnosis not present

## 2021-07-19 DIAGNOSIS — L851 Acquired keratosis [keratoderma] palmaris et plantaris: Secondary | ICD-10-CM | POA: Diagnosis not present

## 2021-09-22 DIAGNOSIS — E1122 Type 2 diabetes mellitus with diabetic chronic kidney disease: Secondary | ICD-10-CM | POA: Diagnosis not present

## 2021-09-22 DIAGNOSIS — I10 Essential (primary) hypertension: Secondary | ICD-10-CM | POA: Diagnosis not present

## 2021-09-29 DIAGNOSIS — E875 Hyperkalemia: Secondary | ICD-10-CM | POA: Diagnosis not present

## 2021-09-29 DIAGNOSIS — E870 Hyperosmolality and hypernatremia: Secondary | ICD-10-CM | POA: Diagnosis not present

## 2021-09-29 DIAGNOSIS — E785 Hyperlipidemia, unspecified: Secondary | ICD-10-CM | POA: Diagnosis not present

## 2021-09-29 DIAGNOSIS — E1122 Type 2 diabetes mellitus with diabetic chronic kidney disease: Secondary | ICD-10-CM | POA: Diagnosis not present

## 2021-09-29 DIAGNOSIS — I482 Chronic atrial fibrillation, unspecified: Secondary | ICD-10-CM | POA: Diagnosis not present

## 2021-09-29 DIAGNOSIS — I10 Essential (primary) hypertension: Secondary | ICD-10-CM | POA: Diagnosis not present

## 2021-09-29 DIAGNOSIS — M25561 Pain in right knee: Secondary | ICD-10-CM | POA: Diagnosis not present

## 2021-09-29 DIAGNOSIS — M542 Cervicalgia: Secondary | ICD-10-CM | POA: Diagnosis not present

## 2021-09-29 DIAGNOSIS — N1831 Chronic kidney disease, stage 3a: Secondary | ICD-10-CM | POA: Diagnosis not present

## 2021-10-27 DIAGNOSIS — E1122 Type 2 diabetes mellitus with diabetic chronic kidney disease: Secondary | ICD-10-CM | POA: Diagnosis not present

## 2021-11-09 NOTE — Progress Notes (Signed)
Cardiology Office Note    Date:  11/22/2021   ID:  Audrey Walker, DOB [birth year], MRN [id number]   PCP:  Benita Stabile, MD   [facility]  Cardiologist:  Nona Dell, MD   Advanced Practice Provider:  No care team member to display Electrophysiologist:  None   [phone]   Chief Complaint  Patient presents with   Follow-up    History of Present Illness:  Audrey Walker is a 88 y.o. female with history of hypertension, DM, CKD, atrial flutter CHA2DS2-VASc score equals 5 has consistently declined anticoagulation.   Last saw Dr. Diona Browner 12/10/2020 and was doing well.  Patient comes in for f/u. Denies chest pain, palpitations, dyspnea, dizziness or presyncope. Lives alone and doesn't drive much anymore.     Past Medical History:  Diagnosis Date   Atrial flutter (HCC)    CKD (chronic kidney disease) stage 2, GFR 60-89 ml/min    Essential hypertension, benign    Type 2 diabetes mellitus (HCC)     History reviewed. No pertinent surgical history.  Current Medications: Current Meds  Medication Sig   acetaminophen (TYLENOL) 500 MG tablet Take 500 mg by mouth every 6 (six) hours as needed for mild pain.   Ascorbic Acid (VITAMIN C) 1000 MG tablet Take 2,000 mg by mouth daily.   atenolol (TENORMIN) 50 MG tablet Take 1 tablet (50 mg total) by mouth daily.   B-D ULTRAFINE III SHORT PEN 31G X 8 MM MISC Inject 1 Syringe into the skin daily.   Blood Glucose Monitoring Suppl (ONE TOUCH ULTRA 2) w/Device KIT    Cholecalciferol (VITAMIN D3) 1.25 MG (50000 UT) CAPS Take 1 capsule by mouth daily.   Cyanocobalamin (VITAMIN B 12 PO) Take 1 tablet by mouth daily.    enalapril (VASOTEC) 20 MG tablet Take 20 mg by mouth daily.   fish oil-omega-3 fatty acids 1000 MG capsule Take 1 g by mouth daily.   milk thistle 175 MG tablet Take 175 mg by mouth daily.   TRESIBA FLEXTOUCH 100 UNIT/ML FlexTouch Pen Inject 12 Units into the skin daily.   White Petrolatum-Mineral Oil  (ARTIFICIAL TEARS) ointment Place 2 drops into both eyes 3 (three) times daily.     Allergies:   Nsaids   Social History   Socioeconomic History   Marital status: Widowed    Spouse name: Not on file   Number of children: Not on file   Years of education: Not on file   Highest education level: Not on file  Occupational History   Not on file  Tobacco Use   Smoking status: Never   Smokeless tobacco: Never  Vaping Use   Vaping Use: Never used  Substance and Sexual Activity   Alcohol use: No   Drug use: No   Sexual activity: Not on file  Other Topics Concern   Not on file  Social History Narrative   Husband passed away from Cancer in Feb 11, 2012  Social Determinants of Health   Financial Resource Strain: Not on file  Food Insecurity: Not on file  Transportation Needs: Not on file  Physical Activity: Not on file  Stress: Not on file  Social Connections: Not on file     Family History:  The patient's  family history includes Diabetes Mellitus II in an other family member; Hypertension in an other family member.   ROS:   Please see the history of present illness.    ROS All other systems reviewed and  are negative.   PHYSICAL EXAM:   VS:  BP (!) 110/54   Pulse 69   Ht 5' 6.5" (1.689 m)   Wt 182 lb (82.6 kg)   SpO2 97%   BMI 28.94 kg/m   Physical Exam  GEN: Well nourished, well developed, in no acute distress  Neck: no JVD, carotid bruits, or masses Cardiac:RRR; no murmurs, rubs, or gallops  Respiratory:  clear to auscultation bilaterally, normal work of breathing GI: soft, nontender, nondistended, + BS Ext: without cyanosis, clubbing, or edema, Good distal pulses bilaterally Neuro:  Alert and Oriented x 3,  Psych: euthymic mood, full affect  Wt Readings from Last 3 Encounters:  11/22/21 182 lb (82.6 kg)  12/10/20 177 lb (80.3 kg)  06/01/20 191 lb 5.8 oz (86.8 kg)      Studies/Labs Reviewed:   EKG:  EKG is  ordered today.  The ekg ordered today  demonstrates NSR normal EKG  Recent Labs: No results found for requested labs within last 365 days.   Lipid Panel No results found for: "CHOL", "TRIG", "HDL", "CHOLHDL", "VLDL", "LDLCALC", "LDLDIRECT"  Additional studies/ records that were reviewed today include:  2D echo 3/2014Study Conclusions   - Left ventricle: The cavity size was normal. There was mild    concentric hypertrophy. Systolic function was normal. The    estimated ejection fraction was in the range of 50% to    55%. Wall motion was normal; there were no regional wall    motion abnormalities. Doppler parameters are consistent    with abnormal left ventricular relaxation (grade 1    diastolic dysfunction).  - Aortic valve: Sclerosis without stenosis.  - Mitral valve: Mild regurgitation.  - Left atrium: The atrium was mildly dilated.  - Right atrium: The atrium was mildly dilated.  - Atrial septum: No defect or patent foramen ovale was    identified.  Impressions:   - Relative taqchycardia.    Mild reduction in EF.    Doppler interrogation shows aatrial flutter pattern.    No significant valvular disease.  Transthoracic echocardiography.  M-mode, complete 2D,  spectral Doppler, and color Doppler.  Height:  Height:  167.6cm. Height: 66in.  Weight:  Weight: 94.3kg. Weight:  207.6lb.  Body mass index:  BMI: 33.6kg/m^2.  Body surface  area:    BSA: 2.68m^2.  Blood pressure:     160/80.  Patient  status:  Outpatient.  Location:  Echo laboratory.    Risk Assessment/Calculations:    CHA2DS2-VASc Score = 5   This indicates a 7.2% annual risk of stroke. The patient's score is based upon: CHF History: 0 HTN History: 1 Diabetes History: 1 Stroke History: 0 Vascular Disease History: 0 Age Score: 2 Gender Score: 1        ASSESSMENT:    1. Atrial flutter, unspecified type ([place])   2. Essential hypertension   3. Type 2 diabetes mellitus with hyperosmolarity without coma, without long-term current use of  insulin (HCC)      PLAN:  In order of problems listed above:  Atrial flutter CHA2DS2-VASc equals 5 patient has declined anticoagulation. In NSR today and asymptomatic. Labs checked routinely by Dr. Nevada Crane  Hypertension BP well controlled on atenolol and enalapril  DM 2 managed by Dr. Nevada Crane     Shared Decision Making/Informed Consent        Medication Adjustments/Labs and Tests Ordered: Current medicines are reviewed at length with the patient today.  Concerns regarding medicines are outlined above.  Medication changes, Labs and  Tests ordered today are listed in the Patient Instructions below. Patient Instructions  Medication Instructions:  Your physician recommends that you continue on your current medications as directed. Please refer to the Current Medication list given to you today.  *If you need a refill on your cardiac medications before your next appointment, please call your pharmacy*   Lab Work: NONE ORDERED  If you have labs (blood work) drawn today and your tests are completely normal, you will receive your results only by: [place] (if you have MyChart) OR A paper copy in the mail If you have any lab test that is abnormal or we need to change your treatment, we will call you to review the results.   Testing/Procedures: NONE ORDERED    Follow-Up: At [facility], you and your health needs are our priority.  As part of our continuing mission to provide you with exceptional heart care, we have created designated Provider Care Teams.  These Care Teams include your primary Cardiologist (physician) and Advanced Practice Providers (APPs -  Physician Assistants and Nurse Practitioners) who all work together to provide you with the care you need, when you need it.  We recommend signing up for the patient portal called "MyChart".  Sign up information is provided on this After Visit Summary.  MyChart is used to connect with patients for Virtual Visits (Telemedicine).   Patients are able to view lab/test results, encounter notes, upcoming appointments, etc.  Non-urgent messages can be sent to your provider as well.   To learn more about what you can do with MyChart, go to [email or web address].    Your next appointment:   1 year(s)  The format for your next appointment:   In Person  Provider:   You may see Rozann Lesches, MD or one of the following Advanced Practice Providers on your designated Care Team:   Bernerd Pho, PA-C  Ermalinda Barrios, [place] {  Important Information About Sugar         Weston Brass Ermalinda Barrios, PA-C  11/22/2021 1:44 PM    [address], [place], [place]  [address] Phone: [phone]; Fax: [phone]

## 2021-11-22 ENCOUNTER — Encounter: Payer: Self-pay | Admitting: Physician Assistant

## 2021-11-22 ENCOUNTER — Ambulatory Visit: Payer: Medicare HMO | Admitting: Physician Assistant

## 2021-11-22 VITALS — BP 110/54 | HR 69 | Ht 66.5 in | Wt 182.0 lb

## 2021-11-22 DIAGNOSIS — I10 Essential (primary) hypertension: Secondary | ICD-10-CM | POA: Diagnosis not present

## 2021-11-22 DIAGNOSIS — E1100 Type 2 diabetes mellitus with hyperosmolarity without nonketotic hyperglycemic-hyperosmolar coma (NKHHC): Secondary | ICD-10-CM | POA: Diagnosis not present

## 2021-11-22 DIAGNOSIS — I4892 Unspecified atrial flutter: Secondary | ICD-10-CM | POA: Diagnosis not present

## 2021-11-22 NOTE — Patient Instructions (Signed)
Medication Instructions:  Your physician recommends that you continue on your current medications as directed. Please refer to the Current Medication list given to you today.  *If you need a refill on your cardiac medications before your next appointment, please call your pharmacy*   Lab Work: NONE ORDERED  If you have labs (blood work) drawn today and your tests are completely normal, you will receive your results only by: [place] (if you have MyChart) OR A paper copy in the mail If you have any lab test that is abnormal or we need to change your treatment, we will call you to review the results.   Testing/Procedures: NONE ORDERED    Follow-Up: At [facility], you and your health needs are our priority.  As part of our continuing mission to provide you with exceptional heart care, we have created designated Provider Care Teams.  These Care Teams include your primary Cardiologist (physician) and Advanced Practice Providers (APPs -  Physician Assistants and Nurse Practitioners) who all work together to provide you with the care you need, when you need it.  We recommend signing up for the patient portal called "MyChart".  Sign up information is provided on this After Visit Summary.  MyChart is used to connect with patients for Virtual Visits (Telemedicine).  Patients are able to view lab/test results, encounter notes, upcoming appointments, etc.  Non-urgent messages can be sent to your provider as well.   To learn more about what you can do with MyChart, go to [email or web address].    Your next appointment:   1 year(s)  The format for your next appointment:   In Person  Provider:   You may see Rozann Lesches, MD or one of the following Advanced Practice Providers on your designated Care Team:   Bernerd Pho, PA-C  Ermalinda Barrios, PA-C {  Important Information About Sugar

## 2021-11-27 DIAGNOSIS — E1122 Type 2 diabetes mellitus with diabetic chronic kidney disease: Secondary | ICD-10-CM | POA: Diagnosis not present

## 2021-12-27 DIAGNOSIS — E1122 Type 2 diabetes mellitus with diabetic chronic kidney disease: Secondary | ICD-10-CM | POA: Diagnosis not present

## 2021-12-29 IMAGING — DX DG CHEST 1V PORT
1 series · 1 of 1 positions shown · non-contrast
Comparison: None

CLINICAL DATA: Positive for COVID.

EXAM:
PORTABLE CHEST 1 VIEW

[chest ap]
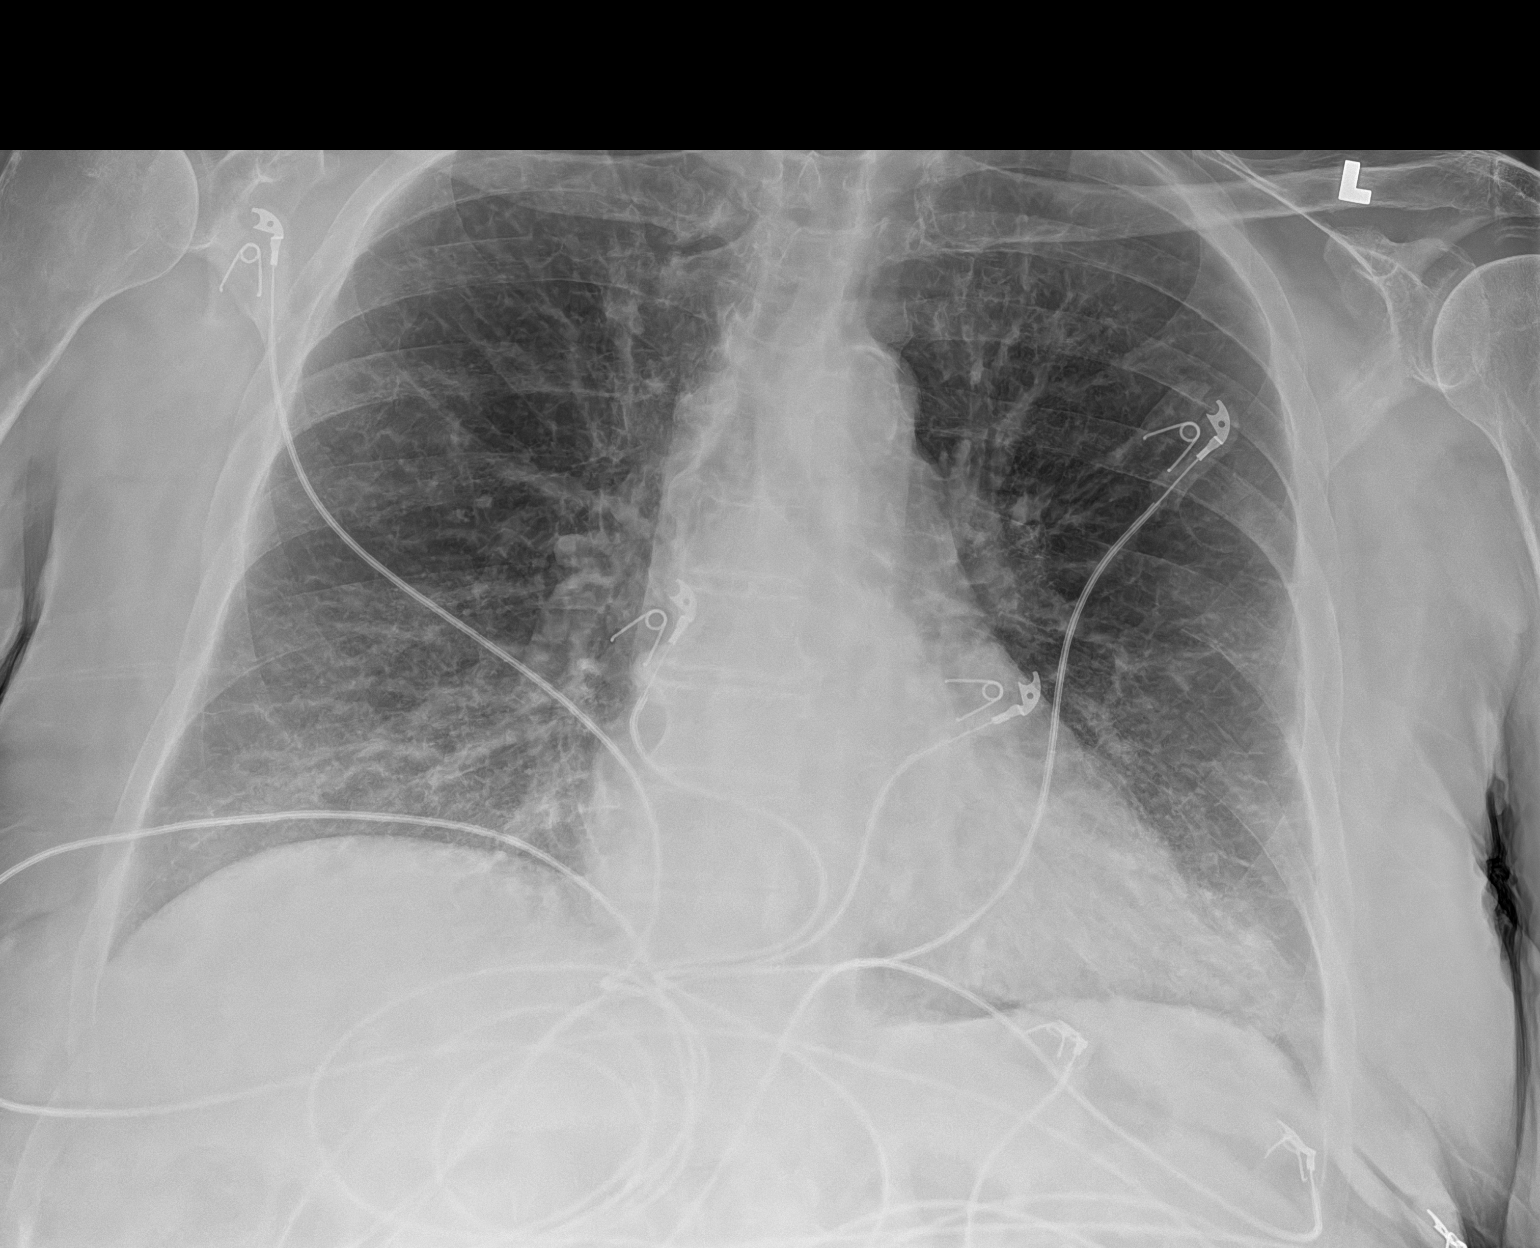

[1 of 1 positions shown; findings below may reference images not displayed]

FINDINGS: Heart size appears normal. Aortic atherosclerosis. Coronary artery
atherosclerotic calcifications identified. Pulmonary vascular
congestion. Chronic bronchitic changes. No superimposed airspace or
interstitial opacities.
IMPRESSION: 1. Pulmonary vascular congestion.
2. Aortic atherosclerosis.

## 2022-01-24 DIAGNOSIS — I10 Essential (primary) hypertension: Secondary | ICD-10-CM | POA: Diagnosis not present

## 2022-01-24 DIAGNOSIS — E119 Type 2 diabetes mellitus without complications: Secondary | ICD-10-CM | POA: Diagnosis not present

## 2022-01-24 DIAGNOSIS — E785 Hyperlipidemia, unspecified: Secondary | ICD-10-CM | POA: Diagnosis not present

## 2022-01-27 ENCOUNTER — Ambulatory Visit (HOSPITAL_COMMUNITY)
Admission: RE | Admit: 2022-01-27 | Discharge: 2022-01-27 | Disposition: A | Discharge status: Home / Self Care | Payer: Medicare HMO | Source: Ambulatory Visit | Attending: Family Medicine | Admitting: Family Medicine

## 2022-01-27 ENCOUNTER — Other Ambulatory Visit (HOSPITAL_COMMUNITY): Payer: Self-pay | Admitting: Family Medicine

## 2022-01-27 DIAGNOSIS — J9811 Atelectasis: Secondary | ICD-10-CM | POA: Diagnosis not present

## 2022-01-27 DIAGNOSIS — J069 Acute upper respiratory infection, unspecified: Secondary | ICD-10-CM | POA: Insufficient documentation

## 2022-01-27 DIAGNOSIS — R051 Acute cough: Secondary | ICD-10-CM | POA: Diagnosis not present

## 2022-01-27 DIAGNOSIS — R0602 Shortness of breath: Secondary | ICD-10-CM | POA: Diagnosis not present

## 2022-02-02 DIAGNOSIS — E870 Hyperosmolality and hypernatremia: Secondary | ICD-10-CM | POA: Diagnosis not present

## 2022-02-02 DIAGNOSIS — E1122 Type 2 diabetes mellitus with diabetic chronic kidney disease: Secondary | ICD-10-CM | POA: Diagnosis not present

## 2022-02-02 DIAGNOSIS — Z6829 Body mass index (BMI) 29.0-29.9, adult: Secondary | ICD-10-CM | POA: Diagnosis not present

## 2022-02-02 DIAGNOSIS — I482 Chronic atrial fibrillation, unspecified: Secondary | ICD-10-CM | POA: Diagnosis not present

## 2022-02-02 DIAGNOSIS — M25561 Pain in right knee: Secondary | ICD-10-CM | POA: Diagnosis not present

## 2022-02-02 DIAGNOSIS — E875 Hyperkalemia: Secondary | ICD-10-CM | POA: Diagnosis not present

## 2022-02-02 DIAGNOSIS — I10 Essential (primary) hypertension: Secondary | ICD-10-CM | POA: Diagnosis not present

## 2022-02-02 DIAGNOSIS — M542 Cervicalgia: Secondary | ICD-10-CM | POA: Diagnosis not present

## 2022-02-02 DIAGNOSIS — R808 Other proteinuria: Secondary | ICD-10-CM | POA: Diagnosis not present

## 2022-02-02 DIAGNOSIS — N1831 Chronic kidney disease, stage 3a: Secondary | ICD-10-CM | POA: Diagnosis not present

## 2022-02-02 DIAGNOSIS — E785 Hyperlipidemia, unspecified: Secondary | ICD-10-CM | POA: Diagnosis not present

## 2022-02-07 ENCOUNTER — Other Ambulatory Visit: Payer: Self-pay

## 2022-02-07 ENCOUNTER — Encounter (HOSPITAL_COMMUNITY): Payer: Self-pay

## 2022-02-07 ENCOUNTER — Emergency Department (HOSPITAL_COMMUNITY): Payer: Medicare HMO

## 2022-02-07 ENCOUNTER — Emergency Department (HOSPITAL_COMMUNITY)
Admission: EM | Admit: 2022-02-07 | Discharge: 2022-02-07 | Disposition: A | Discharge status: Home / Self Care | Payer: Medicare HMO | Attending: Emergency Medicine | Admitting: Emergency Medicine

## 2022-02-07 DIAGNOSIS — R339 Retention of urine, unspecified: Secondary | ICD-10-CM | POA: Insufficient documentation

## 2022-02-07 DIAGNOSIS — Z20822 Contact with and (suspected) exposure to covid-19: Secondary | ICD-10-CM | POA: Insufficient documentation

## 2022-02-07 DIAGNOSIS — E1122 Type 2 diabetes mellitus with diabetic chronic kidney disease: Secondary | ICD-10-CM | POA: Insufficient documentation

## 2022-02-07 DIAGNOSIS — E860 Dehydration: Secondary | ICD-10-CM | POA: Diagnosis not present

## 2022-02-07 DIAGNOSIS — J069 Acute upper respiratory infection, unspecified: Secondary | ICD-10-CM | POA: Diagnosis not present

## 2022-02-07 DIAGNOSIS — I129 Hypertensive chronic kidney disease with stage 1 through stage 4 chronic kidney disease, or unspecified chronic kidney disease: Secondary | ICD-10-CM | POA: Insufficient documentation

## 2022-02-07 DIAGNOSIS — M542 Cervicalgia: Secondary | ICD-10-CM | POA: Diagnosis not present

## 2022-02-07 DIAGNOSIS — R0602 Shortness of breath: Secondary | ICD-10-CM | POA: Diagnosis not present

## 2022-02-07 DIAGNOSIS — R079 Chest pain, unspecified: Secondary | ICD-10-CM | POA: Diagnosis not present

## 2022-02-07 DIAGNOSIS — I6523 Occlusion and stenosis of bilateral carotid arteries: Secondary | ICD-10-CM | POA: Diagnosis not present

## 2022-02-07 DIAGNOSIS — E042 Nontoxic multinodular goiter: Secondary | ICD-10-CM | POA: Diagnosis not present

## 2022-02-07 DIAGNOSIS — N189 Chronic kidney disease, unspecified: Secondary | ICD-10-CM | POA: Insufficient documentation

## 2022-02-07 DIAGNOSIS — R519 Headache, unspecified: Secondary | ICD-10-CM | POA: Diagnosis not present

## 2022-02-07 DIAGNOSIS — I672 Cerebral atherosclerosis: Secondary | ICD-10-CM | POA: Diagnosis not present

## 2022-02-07 DIAGNOSIS — I6503 Occlusion and stenosis of bilateral vertebral arteries: Secondary | ICD-10-CM | POA: Diagnosis not present

## 2022-02-07 DIAGNOSIS — R059 Cough, unspecified: Secondary | ICD-10-CM | POA: Diagnosis not present

## 2022-02-07 LAB — URINALYSIS, ROUTINE W REFLEX MICROSCOPIC
Bilirubin Urine: NEGATIVE
Glucose, UA: NEGATIVE mg/dL
Ketones, ur: NEGATIVE mg/dL
Nitrite: NEGATIVE
Protein, ur: NEGATIVE mg/dL
Specific Gravity, Urine: 1.003 — ABNORMAL LOW (ref 1.005–1.030)
pH: 5 (ref 5.0–8.0)

## 2022-02-07 LAB — BASIC METABOLIC PANEL
Anion gap: 8 (ref 5–15)
BUN: 32 mg/dL — ABNORMAL HIGH (ref 8–23)
CO2: 27 mmol/L (ref 22–32)
Calcium: 8.7 mg/dL — ABNORMAL LOW (ref 8.9–10.3)
Chloride: 93 mmol/L — ABNORMAL LOW (ref 98–111)
Creatinine, Ser: 1.09 mg/dL — ABNORMAL HIGH (ref 0.44–1.00)
GFR, Estimated: 49 mL/min — ABNORMAL LOW (ref >60)
Glucose, Bld: 157 mg/dL — ABNORMAL HIGH (ref 70–99)
Potassium: 4.5 mmol/L (ref 3.5–5.1)
Sodium: 128 mmol/L — ABNORMAL LOW (ref 135–145)

## 2022-02-07 LAB — CBC WITH DIFFERENTIAL/PLATELET
Abs Immature Granulocytes: 0.03 10*3/uL (ref 0.00–0.07)
Basophils Absolute: 0 10*3/uL (ref 0.0–0.1)
Basophils Relative: 0 %
Eosinophils Absolute: 0.1 10*3/uL (ref 0.0–0.5)
Eosinophils Relative: 1 %
HCT: 32.7 % — ABNORMAL LOW (ref 36.0–46.0)
Hemoglobin: 11.1 g/dL — ABNORMAL LOW (ref 12.0–15.0)
Immature Granulocytes: 0 %
Lymphocytes Relative: 17 %
Lymphs Abs: 1.5 10*3/uL (ref 0.7–4.0)
MCH: 31.3 pg (ref 26.0–34.0)
MCHC: 33.9 g/dL (ref 30.0–36.0)
MCV: 92.1 fL (ref 80.0–100.0)
Monocytes Absolute: 0.8 10*3/uL (ref 0.1–1.0)
Monocytes Relative: 9 %
Neutro Abs: 6.6 10*3/uL (ref 1.7–7.7)
Neutrophils Relative %: 73 %
Platelets: 163 10*3/uL (ref 150–400)
RBC: 3.55 MIL/uL — ABNORMAL LOW (ref 3.87–5.11)
RDW: 11.8 % (ref 11.5–15.5)
WBC: 9.1 10*3/uL (ref 4.0–10.5)
nRBC: 0 % (ref 0.0–0.2)

## 2022-02-07 LAB — SARS CORONAVIRUS 2 BY RT PCR: SARS Coronavirus 2 by RT PCR: NEGATIVE

## 2022-02-07 LAB — BRAIN NATRIURETIC PEPTIDE: B Natriuretic Peptide: 136 pg/mL — ABNORMAL HIGH (ref 0.0–100.0)

## 2022-02-07 MED ORDER — IOHEXOL 350 MG/ML SOLN
100.0000 mL | Freq: Once | INTRAVENOUS | Status: AC | PRN
Start: 2022-02-07 — End: 2022-02-07
  Administered 2022-02-07: 50 mL via INTRAVENOUS

## 2022-02-07 MED ORDER — OXYMETAZOLINE HCL 0.05 % NA SOLN: 1.0000 | Freq: Two times a day (BID) | NASAL | Status: DC

## 2022-02-07 MED ORDER — ONDANSETRON HCL 4 MG/2ML IJ SOLN
4.0000 mg | Freq: Once | INTRAMUSCULAR | Status: AC
  Administered 2022-02-07: 4 mg via INTRAVENOUS
  Filled 2022-02-07: qty 2

## 2022-02-07 MED ORDER — SODIUM CHLORIDE 0.9 % IV BOLUS: 1000.0000 mL | Freq: Once | INTRAVENOUS | Status: DC

## 2022-02-07 MED ORDER — FLUTICASONE PROPIONATE 50 MCG/ACT NA SUSP
1.0000 | Freq: Every day | NASAL | 2 refills | Status: DC
Start: 2022-02-07 — End: 2024-04-05

## 2022-02-07 NOTE — ED Provider Notes (Signed)
[facility] EMERGENCY DEPARTMENT Provider Note   CSN: [id number] Arrival date & time: 02/07/22  1635     History  Chief Complaint  Patient presents with   Urinary Retention   Neck Pain    Audrey Walker is a 88 y.o. female.  Pt is a 88 yo female with a pmhx significant DM2, HTN, CKD, and a flutter (chadvasc of 5, but pt has refused anticoagulation).  Pt is here for several problems.  She has been having left sided neck pain and headache since a chiropractor adjusted her neck about 2 months ago.  She has had sob and cough.  PCP did treat her for bronchitis with abx (family unsure which one).   SOB has improved some.  Since Saturday, 8/26, pt has had a hard time urinating.  She said just a little comes out when she tries to go.  She complains of feeling "sick" in her head.   Pt has never had urinary retention in the past.       Home Medications Prior to Admission medications   Medication Sig Start Date End Date Taking? Authorizing Provider  fluticasone (FLONASE) 50 MCG/ACT nasal spray Place 1 spray into both nostrils daily. 02/07/22  Yes Isla Pence, MD  acetaminophen (TYLENOL) 500 MG tablet Take 500 mg by mouth every 6 (six) hours as needed for mild pain.    [provider]  Ascorbic Acid (VITAMIN C) 1000 MG tablet Take 2,000 mg by mouth daily.    [provider]  atenolol (TENORMIN) 50 MG tablet Take 1 tablet (50 mg total) by mouth daily. 08/11/14   Satira Sark, MD  B-D ULTRAFINE III SHORT PEN 31G X 8 MM MISC Inject 1 Syringe into the skin daily. 09/30/19   [provider]  Blood Glucose Monitoring Suppl (ONE TOUCH ULTRA 2) w/Device KIT  07/02/19   [provider]  Cholecalciferol (VITAMIN D3) 1.25 MG (50000 UT) CAPS Take 1 capsule by mouth daily.    [provider]  Cyanocobalamin (VITAMIN B 12 PO) Take 1 tablet by mouth daily.     [provider]  enalapril (VASOTEC) 20 MG tablet Take 20 mg by mouth daily.    [provider]  fish oil-omega-3 fatty acids 1000 MG capsule Take 1 g by mouth daily.    [provider]  milk thistle 175 MG tablet Take 175 mg by mouth daily.    [provider]  TRESIBA FLEXTOUCH 100 UNIT/ML FlexTouch Pen Inject 12 Units into the skin daily. 02/25/20   [provider]  Jay Schlichter Oil (ARTIFICIAL TEARS) ointment Place 2 drops into both eyes 3 (three) times daily.    [provider]      Allergies    Nsaids    Review of Systems   Review of Systems  Genitourinary:  Positive for decreased urine volume and difficulty urinating.  Musculoskeletal:  Positive for neck pain.  Neurological:  Positive for headaches.  All other systems reviewed and are negative.   Physical Exam Updated Vital Signs BP (!) 117/95   Pulse (!) 55   Temp 97.9 F (36.6 C) (Oral)   Resp 15   SpO2 99%  Physical Exam Vitals and nursing note reviewed.  Constitutional:      Appearance: Normal appearance.  HENT:     Head: Normocephalic and atraumatic.     Right Ear: External ear normal.     Left Ear: External ear normal.     Nose: Nose normal.  Mouth/Throat:     Mouth: Mucous membranes are dry.  Eyes:     Extraocular Movements: Extraocular movements intact.     Conjunctiva/sclera: Conjunctivae normal.     Pupils: Pupils are equal, round, and reactive to light.  Cardiovascular:     Rate and Rhythm: Normal rate and regular rhythm.     Pulses: Normal pulses.     Heart sounds: Normal heart sounds.  Pulmonary:     Effort: Pulmonary effort is normal.     Breath sounds: Normal breath sounds.  Abdominal:     General: Abdomen is flat. Bowel sounds are normal.     Palpations: Abdomen is soft.  Musculoskeletal:        General: Normal range of motion.     Cervical back: Normal range of motion and neck supple.  Skin:    General: Skin is warm.     Capillary Refill: Capillary refill takes less than 2 seconds.  Neurological:     General: No  focal deficit present.     Mental Status: She is alert and oriented to person, place, and time.  Psychiatric:        Mood and Affect: Mood normal.        Behavior: Behavior normal.     ED Results / Procedures / Treatments   Labs (all labs ordered are listed, but only abnormal results are displayed) Labs Reviewed  BASIC METABOLIC PANEL - Abnormal; Notable for the following components:      Result Value   Sodium 128 (*)    Chloride 93 (*)    Glucose, Bld 157 (*)    BUN 32 (*)    Creatinine, Ser 1.09 (*)    Calcium 8.7 (*)    GFR, Estimated 49 (*)    All other components within normal limits  BRAIN NATRIURETIC PEPTIDE - Abnormal; Notable for the following components:   B Natriuretic Peptide 136.0 (*)    All other components within normal limits  CBC WITH DIFFERENTIAL/PLATELET - Abnormal; Notable for the following components:   RBC 3.55 (*)    Hemoglobin 11.1 (*)    HCT 32.7 (*)    All other components within normal limits  URINALYSIS, ROUTINE W REFLEX MICROSCOPIC - Abnormal; Notable for the following components:   Color, Urine STRAW (*)    Specific Gravity, Urine 1.003 (*)    Hgb urine dipstick SMALL (*)    Leukocytes,Ua TRACE (*)    Bacteria, UA MANY (*)    All other components within normal limits  SARS CORONAVIRUS 2 BY RT PCR  URINE CULTURE    EKG EKG Interpretation  Date/Time:  Monday February 07 2022 17:46:12 EDT Ventricular Rate:  61 PR Interval:  190 QRS Duration: 86 QT Interval:  422 QTC Calculation: 426 R Axis:   27 Text Interpretation: Sinus rhythm Probable left atrial enlargement Low voltage, precordial leads Abnormal R-wave progression, early transition pt now in sinus Confirmed by Isla Pence [phone]) on 02/07/2022 7:02:20 PM  Radiology CT Angio Neck W and/or Wo Contrast  Result Date: 02/07/2022 CLINICAL DATA:  Initial evaluation for acute headache, left-sided neck pain. EXAM: CT ANGIOGRAPHY NECK TECHNIQUE: Multidetector CT imaging of the neck was  performed using the standard protocol during bolus administration of intravenous contrast. Multiplanar CT image reconstructions and MIPs were obtained to evaluate the vascular anatomy. Carotid stenosis measurements (when applicable) are obtained utilizing NASCET criteria, using the distal internal carotid diameter as the denominator. RADIATION DOSE REDUCTION: This exam was performed according to the  departmental dose-optimization program which includes automated exposure control, adjustment of the mA and/or kV according to patient size and/or use of iterative reconstruction technique. CONTRAST:  14m OMNIPAQUE IOHEXOL 350 MG/ML SOLN COMPARISON:  None Available. FINDINGS: Aortic arch: Visualized aortic arch normal in caliber with standard 3 vessel morphology. Moderate atheromatous change about the arch and origin the great vessels without hemodynamically significant stenosis. Right carotid system: Right CCA patent without stenosis. Calcified plaque about the right carotid bulb/proximal right ICA with associated stenosis of up to 50% by NASCET criteria. Right ICA patent distally without stenosis or dissection. Left carotid system: Left CCA patent without stenosis. Mixed plaque about the left carotid bulb/proximal left ICA with associated stenosis of up to 50% by NASCET criteria. Left ICA patent distally without stenosis or dissection. Vertebral arteries: Both vertebral arteries arise from the subclavian arteries. No proximal subclavian artery stenosis. Atheromatous change at the origins of both vertebral arteries with associated moderate stenosis on the right and severe stenosis on the left. Vertebral arteries patent distally without stenosis or dissection. Skeleton: No discrete or worrisome osseous lesions. Moderate spondylosis present at C5-6 and C6-7. Other neck: Both acute soft tissue abnormality within the neck. Multiple scattered thyroid nodules noted, largest of which measures 2 cm at the isthmus. In the  setting of significant comorbidities or limited life expectancy, no follow-up recommended. (Ref: J Am Coll Radiol. 2015 Feb;12(2): 143-50). Upper chest: Visualized upper chest demonstrates no acute finding. IMPRESSION: 1. No CTA evidence for acute vascular abnormality within the neck. 2. Atheromatous plaque about the carotid bifurcations/proximal ICAs with associated stenoses of up to 50% bilaterally. 3. Atheromatous change at the origins of both vertebral arteries with associated moderate right and severe left vertebral artery origin stenoses. Electronically Signed   By: BJeannine BogaM.D.   On: 02/07/2022 19:34   CT Head Wo Contrast  Result Date: 02/07/2022 CLINICAL DATA:  Headaches EXAM: CT HEAD WITHOUT CONTRAST TECHNIQUE: Contiguous axial images were obtained from the base of the skull through the vertex without intravenous contrast. RADIATION DOSE REDUCTION: This exam was performed according to the departmental dose-optimization program which includes automated exposure control, adjustment of the mA and/or kV according to patient size and/or use of iterative reconstruction technique. COMPARISON:  None Available. FINDINGS: Brain: No acute intracranial findings are seen. There are no signs of bleeding within the cranium. Cortical sulci are prominent. Vascular: Unremarkable. Skull: Unremarkable. Sinuses/Orbits: There is mucosal thickening in the ethmoid and right maxillary sinuses. Other: None. IMPRESSION: No acute intracranial findings are seen in noncontrast CT brain. Atrophy. Chronic sinusitis. Electronically Signed   By: PElmer PickerM.D.   On: 02/07/2022 19:25   DG Chest Port 1 View  Result Date: 02/07/2022 CLINICAL DATA:  Urinary retention, left-sided neck pain for 2 months after chiropractic adjustment, cough EXAM: PORTABLE CHEST 1 VIEW COMPARISON:  01/27/2022 FINDINGS: Single frontal view of the chest demonstrates an unremarkable cardiac silhouette. No airspace disease, effusion, or  pneumothorax. There are no acute bony abnormalities. IMPRESSION: 1. No acute intrathoracic process. Electronically Signed   By: MRanda NgoM.D.   On: 02/07/2022 17:41    Procedures Procedures    Medications Ordered in ED Medications  oxymetazoline (AFRIN) 0.05 % nasal spray 1 spray (has no administration in time range)  ondansetron (ZOFRAN) injection 4 mg (4 mg Intravenous Given 02/07/22 1747)  sodium chloride 0.9 % bolus 1,000 mL (1,000 mLs Intravenous Bolus 02/07/22 1746)  iohexol (OMNIPAQUE) 350 MG/ML injection 100 mL (50 mLs Intravenous Contrast Given  02/07/22 1919)    ED Course/ Medical Decision Making/ A&P                           Medical Decision Making Amount and/or Complexity of Data Reviewed Labs: ordered. Radiology: ordered.  Risk OTC drugs. Prescription drug management.   This patient presents to the ED for concern of urinary retention, this involves an extensive number of treatment options, and is a complaint that carries with it a high risk of complications and morbidity.  The differential diagnosis includes uti, urinary retention   Co morbidities that complicate the patient evaluation  DM2, HTN, CKD, and a flutter (chadvasc of 5, but pt has refused anticoagulation)   Additional history obtained:  Additional history obtained from epic chart review External records from outside source obtained and reviewed including family   Lab Tests:  I Ordered, and personally interpreted labs.  The pertinent results include:  cbc with hgb 11.1 (chronic); UA nl other than many bacteria (urine sent for cx); bmp with na low at 128 (hx of this, but last na 142 in Jan of 2022); bun 32 and cr 1.09 (chronic); covid neg   Imaging Studies ordered:  I ordered imaging studies including cxr, head ct, cta neck  I independently visualized and interpreted imaging which showed  CXR: IMPRESSION:  1. No acute intrathoracic process.  CT head: IMPRESSION:  No acute intracranial  findings are seen in noncontrast CT brain.  Atrophy.    Chronic sinusitis.  CTA neck: IMPRESSION:  1. No CTA evidence for acute vascular abnormality within the neck.  2. Atheromatous plaque about the carotid bifurcations/proximal ICAs  with associated stenoses of up to 50% bilaterally.  3. Atheromatous change at the origins of both vertebral arteries  with associated moderate right and severe left vertebral artery  origin stenoses.   I agree with the radiologist interpretation   Cardiac Monitoring:  The patient was maintained on a cardiac monitor.  I personally viewed and interpreted the cardiac monitored which showed an underlying rhythm of: nsr   Medicines ordered and prescription drug management:  I ordered medication including IVFs  for dehydration  Reevaluation of the patient after these medicines showed that the patient improved I have reviewed the patients home medicines and have made adjustments as needed   Test Considered:  ct   Critical Interventions:  ivfs   Problem List / ED Course:  Urinary retention:  likely from dehydration.  No evidence of UTI.  She has put out over a L of urine since she's been here.  Pt feels much better since the fluids were given. Hyponatremia:  chronic Neck pain:  no evidence of carotid a dissection URI:  neg covid.  CXR clear.  Pt d/c with afrin and flonase   Reevaluation:  After the interventions noted above, I reevaluated the patient and found that they have :improved   Social Determinants of Health:  Lives at home   Dispostion:  After consideration of the diagnostic results and the patients response to treatment, I feel that the patent would benefit from discharge with outpatient f/u.          Final Clinical Impression(s) / ED Diagnoses Final diagnoses:  Dehydration  Viral upper respiratory tract infection    Rx / DC Orders ED Discharge Orders          Ordered    fluticasone (FLONASE) 50 MCG/ACT  nasal spray  Daily  02/07/22 2036              Isla Pence, MD 02/07/22 2040

## 2022-02-07 NOTE — ED Triage Notes (Signed)
Patient complaining of urinary retention that started this past Saturday. Complaining of left side neck pain for the past two months that started after Chiropractor adjusted her neck. Recently treated for URI.

## 2022-02-07 NOTE — ED Notes (Signed)
Bladder scan = 466 mL

## 2022-02-09 LAB — URINE CULTURE: Culture: >=100000 — AB

## 2022-02-10 ENCOUNTER — Telehealth: Payer: Self-pay | Admitting: *Deleted

## 2022-02-10 NOTE — Telephone Encounter (Signed)
Post ED Visit - Positive Culture Follow-up  Culture report reviewed by antimicrobial stewardship pharmacist: [place] Team '[]'$  Elenor Quinones, Pharm.D. '[]'$  Heide Guile, Pharm.D., BCPS AQ-ID '[]'$  Parks Neptune, Pharm.D., BCPS '[]'$  Alycia Rossetti, Pharm.D., BCPS '[]'$  Fife, Pharm.D., BCPS, AAHIVP '[]'$  Legrand Como, Pharm.D., BCPS, AAHIVP '[]'$  Salome Arnt, PharmD, BCPS '[]'$  Johnnette Gourd, PharmD, BCPS '[]'$  Hughes Better, PharmD, BCPS '[]'$  Leeroy Cha, PharmD '[]'$  Laqueta Linden, PharmD, BCPS '[]'$  Albertina Parr, PharmD  [place] Team '[]'$  Leodis Sias, PharmD '[]'$  Lindell Spar, PharmD '[]'$  Royetta Asal, PharmD '[]'$  Graylin Shiver, Rph '[]'$  Rema Fendt) Glennon Mac, PharmD '[]'$  Arlyn Dunning, PharmD '[]'$  Netta Cedars, PharmD '[]'$  Dia Sitter, PharmD '[]'$  Leone Haven, PharmD '[]'$  Gretta Arab, PharmD '[]'$  Theodis Shove, PharmD '[]'$  Peggyann Juba, PharmD '[]'$  Reuel Boom, PharmD   Positive urine culture Suymptoms resolving and no S/S of UTI and no further patient follow-up is required at this time. [place], Blue, PA-C  Harlon Flor [place] 02/10/2022, 9:51 AM

## 2022-02-10 NOTE — Progress Notes (Signed)
ED Antimicrobial Stewardship Positive Culture Follow Up   Audrey Walker is an 88 y.o. female who presented to [facility] on 02/07/2022 with a chief complaint of  Chief Complaint  Patient presents with   Urinary Retention   Neck Pain    Recent Results (from the past 720 hour(s))  Urine Culture     Status: Abnormal   Collection Time: 02/07/22  5:51 PM   Specimen: Urine, Clean Catch  Result Value Ref Range Status   Specimen Description   Final    URINE, CLEAN CATCH Performed at [facility], [address], [place], [place] [address]    Special Requests   Final    NONE Performed at [facility], [address] [place], [place] [address]    Culture >=100,000 COLONIES/mL KLEBSIELLA PNEUMONIAE (A)  Final   Report Status 02/09/2022 FINAL  Final   Organism ID, Bacteria KLEBSIELLA PNEUMONIAE (A)  Final      Susceptibility   Klebsiella pneumoniae - MIC*    AMPICILLIN >=32 RESISTANT Resistant     CEFAZOLIN <=4 SENSITIVE Sensitive     CEFEPIME <=0.12 SENSITIVE Sensitive     CEFTRIAXONE <=0.25 SENSITIVE Sensitive     CIPROFLOXACIN <=0.25 SENSITIVE Sensitive     GENTAMICIN <=1 SENSITIVE Sensitive     IMIPENEM 1 SENSITIVE Sensitive     NITROFURANTOIN 64 INTERMEDIATE Intermediate     TRIMETH/SULFA <=20 SENSITIVE Sensitive     AMPICILLIN/SULBACTAM >=32 RESISTANT Resistant     PIP/TAZO <=4 SENSITIVE Sensitive     * >=100,000 COLONIES/mL KLEBSIELLA PNEUMONIAE  SARS Coronavirus 2 by RT PCR (hospital order, performed in [place] hospital lab) *cepheid single result test* Anterior Nasal Swab     Status: None   Collection Time: 02/07/22  7:24 PM   Specimen: Anterior Nasal Swab  Result Value Ref Range Status   SARS Coronavirus 2 by RT PCR NEGATIVE NEGATIVE Final    Comment: (NOTE) SARS-CoV-2 target nucleic acids are NOT DETECTED.  The SARS-CoV-2 RNA is generally detectable in upper and lower respiratory specimens during the acute phase of infection. The lowest concentration of SARS-CoV-2  viral copies this assay can detect is 250 copies / mL. A negative result does not preclude SARS-CoV-2 infection and should not be used as the sole basis for treatment or other patient management decisions.  A negative result may occur with improper specimen collection / handling, submission of specimen other than nasopharyngeal swab, presence of viral mutation(s) within the areas targeted by this assay, and inadequate number of viral copies (<250 copies / mL). A negative result must be combined with clinical observations, patient history, and epidemiological information.  Fact Sheet for Patients:   https://www.patel.info/  Fact Sheet for Healthcare Providers: https://hall.com/  This test is not yet approved or  cleared by the [place] FDA and has been authorized for detection and/or diagnosis of SARS-CoV-2 by FDA under an Emergency Use Authorization (EUA).  This EUA will remain in effect (meaning this test can be used) for the duration of the COVID-19 declaration under Section 564(b)(1) of the Act, 21 U.S.C. section 360bbb-3(b)(1), unless the authorization is terminated or revoked sooner.  Performed at [facility], [address] [place], [place] [address]     Discharged on no antibiotic since thought urinary retention due to dehydration. Call patient for symptom check. If no urinary symptoms present no further treatment needed. If urinary symptoms present start keflex '500mg'$  by mouth every 6 hrs for 5 days.   New antibiotic prescription: only if  urinary symptoms present - keflex '500mg'$  by mouth every 6 hrs for 5 days.   ED Provider: Steva Colder, PA-C  Cristela Felt, PharmD, BCPS Clinical Pharmacist 02/10/2022 8:14 AM  Monday - Friday phone -  [phone] Saturday - Sunday phone - [phone]

## 2022-02-28 DIAGNOSIS — E860 Dehydration: Secondary | ICD-10-CM | POA: Diagnosis not present

## 2022-03-02 DIAGNOSIS — E1122 Type 2 diabetes mellitus with diabetic chronic kidney disease: Secondary | ICD-10-CM | POA: Diagnosis not present

## 2022-03-03 DIAGNOSIS — L851 Acquired keratosis [keratoderma] palmaris et plantaris: Secondary | ICD-10-CM | POA: Diagnosis not present

## 2022-03-03 DIAGNOSIS — B351 Tinea unguium: Secondary | ICD-10-CM | POA: Diagnosis not present

## 2022-03-03 DIAGNOSIS — E1151 Type 2 diabetes mellitus with diabetic peripheral angiopathy without gangrene: Secondary | ICD-10-CM | POA: Diagnosis not present

## 2022-03-09 DIAGNOSIS — E1122 Type 2 diabetes mellitus with diabetic chronic kidney disease: Secondary | ICD-10-CM | POA: Diagnosis not present

## 2022-03-10 DIAGNOSIS — E1122 Type 2 diabetes mellitus with diabetic chronic kidney disease: Secondary | ICD-10-CM | POA: Diagnosis not present

## 2022-03-14 DIAGNOSIS — E1122 Type 2 diabetes mellitus with diabetic chronic kidney disease: Secondary | ICD-10-CM | POA: Diagnosis not present

## 2022-03-16 DIAGNOSIS — H18231 Secondary corneal edema, right eye: Secondary | ICD-10-CM | POA: Diagnosis not present

## 2022-03-21 ENCOUNTER — Emergency Department (HOSPITAL_COMMUNITY): Payer: Medicare HMO

## 2022-03-21 ENCOUNTER — Inpatient Hospital Stay (HOSPITAL_COMMUNITY)
Admission: EM | Admit: 2022-03-21 | Discharge: 2022-03-25 | DRG: 281 | Disposition: A | Discharge status: Home Health | Payer: Medicare HMO | Attending: Family Medicine | Admitting: Family Medicine

## 2022-03-21 DIAGNOSIS — N1831 Chronic kidney disease, stage 3a: Secondary | ICD-10-CM | POA: Diagnosis present

## 2022-03-21 DIAGNOSIS — Z833 Family history of diabetes mellitus: Secondary | ICD-10-CM

## 2022-03-21 DIAGNOSIS — E119 Type 2 diabetes mellitus without complications: Secondary | ICD-10-CM

## 2022-03-21 DIAGNOSIS — N179 Acute kidney failure, unspecified: Secondary | ICD-10-CM | POA: Diagnosis not present

## 2022-03-21 DIAGNOSIS — I10 Essential (primary) hypertension: Secondary | ICD-10-CM | POA: Diagnosis not present

## 2022-03-21 DIAGNOSIS — I4892 Unspecified atrial flutter: Secondary | ICD-10-CM | POA: Diagnosis present

## 2022-03-21 DIAGNOSIS — R079 Chest pain, unspecified: Secondary | ICD-10-CM

## 2022-03-21 DIAGNOSIS — Z7901 Long term (current) use of anticoagulants: Secondary | ICD-10-CM | POA: Diagnosis not present

## 2022-03-21 DIAGNOSIS — Z8249 Family history of ischemic heart disease and other diseases of the circulatory system: Secondary | ICD-10-CM | POA: Diagnosis not present

## 2022-03-21 DIAGNOSIS — I480 Paroxysmal atrial fibrillation: Secondary | ICD-10-CM

## 2022-03-21 DIAGNOSIS — I5032 Chronic diastolic (congestive) heart failure: Secondary | ICD-10-CM | POA: Diagnosis present

## 2022-03-21 DIAGNOSIS — E11649 Type 2 diabetes mellitus with hypoglycemia without coma: Secondary | ICD-10-CM | POA: Diagnosis present

## 2022-03-21 DIAGNOSIS — I4891 Unspecified atrial fibrillation: Secondary | ICD-10-CM | POA: Diagnosis not present

## 2022-03-21 DIAGNOSIS — I2510 Atherosclerotic heart disease of native coronary artery without angina pectoris: Secondary | ICD-10-CM | POA: Diagnosis not present

## 2022-03-21 DIAGNOSIS — Z602 Problems related to living alone: Secondary | ICD-10-CM | POA: Diagnosis present

## 2022-03-21 DIAGNOSIS — E785 Hyperlipidemia, unspecified: Secondary | ICD-10-CM | POA: Diagnosis not present

## 2022-03-21 DIAGNOSIS — D631 Anemia in chronic kidney disease: Secondary | ICD-10-CM | POA: Diagnosis not present

## 2022-03-21 DIAGNOSIS — E1122 Type 2 diabetes mellitus with diabetic chronic kidney disease: Secondary | ICD-10-CM | POA: Diagnosis present

## 2022-03-21 DIAGNOSIS — R413 Other amnesia: Secondary | ICD-10-CM | POA: Diagnosis present

## 2022-03-21 DIAGNOSIS — Z6829 Body mass index (BMI) 29.0-29.9, adult: Secondary | ICD-10-CM | POA: Diagnosis not present

## 2022-03-21 DIAGNOSIS — Z79899 Other long term (current) drug therapy: Secondary | ICD-10-CM | POA: Diagnosis not present

## 2022-03-21 DIAGNOSIS — I130 Hypertensive heart and chronic kidney disease with heart failure and stage 1 through stage 4 chronic kidney disease, or unspecified chronic kidney disease: Secondary | ICD-10-CM | POA: Diagnosis present

## 2022-03-21 DIAGNOSIS — I214 Non-ST elevation (NSTEMI) myocardial infarction: Principal | ICD-10-CM | POA: Diagnosis present

## 2022-03-21 DIAGNOSIS — Z794 Long term (current) use of insulin: Secondary | ICD-10-CM | POA: Diagnosis not present

## 2022-03-21 DIAGNOSIS — Z7982 Long term (current) use of aspirin: Secondary | ICD-10-CM | POA: Diagnosis not present

## 2022-03-21 DIAGNOSIS — E669 Obesity, unspecified: Secondary | ICD-10-CM | POA: Diagnosis not present

## 2022-03-21 DIAGNOSIS — Z886 Allergy status to analgesic agent status: Secondary | ICD-10-CM

## 2022-03-21 DIAGNOSIS — R7989 Other specified abnormal findings of blood chemistry: Secondary | ICD-10-CM

## 2022-03-21 DIAGNOSIS — Z66 Do not resuscitate: Secondary | ICD-10-CM | POA: Diagnosis present

## 2022-03-21 DIAGNOSIS — N183 Chronic kidney disease, stage 3 unspecified: Secondary | ICD-10-CM | POA: Diagnosis present

## 2022-03-21 LAB — CBC WITH DIFFERENTIAL/PLATELET
Abs Immature Granulocytes: 0.02 10*3/uL (ref 0.00–0.07)
Basophils Absolute: 0 10*3/uL (ref 0.0–0.1)
Basophils Relative: 0 %
Eosinophils Absolute: 0.1 10*3/uL (ref 0.0–0.5)
Eosinophils Relative: 1 %
HCT: 35.6 % — ABNORMAL LOW (ref 36.0–46.0)
Hemoglobin: 11.6 g/dL — ABNORMAL LOW (ref 12.0–15.0)
Immature Granulocytes: 0 %
Lymphocytes Relative: 21 %
Lymphs Abs: 1.5 10*3/uL (ref 0.7–4.0)
MCH: 31.1 pg (ref 26.0–34.0)
MCHC: 32.6 g/dL (ref 30.0–36.0)
MCV: 95.4 fL (ref 80.0–100.0)
Monocytes Absolute: 0.5 10*3/uL (ref 0.1–1.0)
Monocytes Relative: 7 %
Neutro Abs: 4.9 10*3/uL (ref 1.7–7.7)
Neutrophils Relative %: 71 %
Platelets: 147 10*3/uL — ABNORMAL LOW (ref 150–400)
RBC: 3.73 MIL/uL — ABNORMAL LOW (ref 3.87–5.11)
RDW: 12.1 % (ref 11.5–15.5)
WBC: 7 10*3/uL (ref 4.0–10.5)
nRBC: 0 % (ref 0.0–0.2)

## 2022-03-21 LAB — COMPREHENSIVE METABOLIC PANEL
ALT: 35 U/L (ref 0–44)
AST: 40 U/L (ref 15–41)
Albumin: 3.8 g/dL (ref 3.5–5.0)
Alkaline Phosphatase: 62 U/L (ref 38–126)
Anion gap: 7 (ref 5–15)
BUN: 30 mg/dL — ABNORMAL HIGH (ref 8–23)
CO2: 29 mmol/L (ref 22–32)
Calcium: 9.5 mg/dL (ref 8.9–10.3)
Chloride: 107 mmol/L (ref 98–111)
Creatinine, Ser: 1.16 mg/dL — ABNORMAL HIGH (ref 0.44–1.00)
GFR, Estimated: 46 mL/min — ABNORMAL LOW (ref >60)
Glucose, Bld: 103 mg/dL — ABNORMAL HIGH (ref 70–99)
Potassium: 4.2 mmol/L (ref 3.5–5.1)
Sodium: 143 mmol/L (ref 135–145)
Total Bilirubin: 1 mg/dL (ref 0.3–1.2)
Total Protein: 6.3 g/dL — ABNORMAL LOW (ref 6.5–8.1)

## 2022-03-21 LAB — URINALYSIS, ROUTINE W REFLEX MICROSCOPIC
Bilirubin Urine: NEGATIVE
Glucose, UA: NEGATIVE mg/dL
Hgb urine dipstick: NEGATIVE
Ketones, ur: NEGATIVE mg/dL
Nitrite: NEGATIVE
Protein, ur: 30 mg/dL — AB
Specific Gravity, Urine: 1.006 (ref 1.005–1.030)
pH: 6 (ref 5.0–8.0)

## 2022-03-21 LAB — HEPARIN LEVEL (UNFRACTIONATED): Heparin Unfractionated: 0.37 IU/mL (ref 0.30–0.70)

## 2022-03-21 LAB — CBG MONITORING, ED
Glucose-Capillary: 106 mg/dL — ABNORMAL HIGH (ref 70–99)
Glucose-Capillary: 64 mg/dL — ABNORMAL LOW (ref 70–99)
Glucose-Capillary: 189 mg/dL — ABNORMAL HIGH (ref 70–99)

## 2022-03-21 LAB — TROPONIN I (HIGH SENSITIVITY)
Troponin I (High Sensitivity): 216 ng/L (ref <18)
Troponin I (High Sensitivity): 218 ng/L (ref <18)

## 2022-03-21 MED ORDER — INSULIN GLARGINE-YFGN 100 UNIT/ML ~~LOC~~ SOLN
10.0000 [IU] | Freq: Every day | SUBCUTANEOUS | Status: DC
  Administered 2022-03-21: 10 [IU] via SUBCUTANEOUS
  Filled 2022-03-21 (×2): qty 0.1

## 2022-03-21 MED ORDER — ACETAMINOPHEN 325 MG PO TABS
650.0000 mg | ORAL_TABLET | ORAL | Status: DC | PRN
  Administered 2022-03-22: 650 mg via ORAL
  Filled 2022-03-21: qty 2

## 2022-03-21 MED ORDER — HEPARIN (PORCINE) 25000 UT/250ML-% IV SOLN
14.0000 [IU]/kg/h | INTRAVENOUS | Status: DC
Start: 2022-03-21 — End: 2022-03-21

## 2022-03-21 MED ORDER — ENALAPRIL MALEATE 10 MG PO TABS
20.0000 mg | ORAL_TABLET | Freq: Every day | ORAL | Status: DC
  Administered 2022-03-22 – 2022-03-23 (×2): 20 mg via ORAL
  Filled 2022-03-21 (×3): qty 2

## 2022-03-21 MED ORDER — ONDANSETRON HCL 4 MG/2ML IJ SOLN: 4.0000 mg | Freq: Four times a day (QID) | INTRAMUSCULAR | Status: DC | PRN

## 2022-03-21 MED ORDER — INSULIN ASPART 100 UNIT/ML IJ SOLN
0.0000 [IU] | Freq: Three times a day (TID) | INTRAMUSCULAR | Status: DC
  Administered 2022-03-22: 2 [IU] via SUBCUTANEOUS
  Administered 2022-03-23: 3 [IU] via SUBCUTANEOUS
  Administered 2022-03-23 (×2): 2 [IU] via SUBCUTANEOUS
  Administered 2022-03-24 (×2): 3 [IU] via SUBCUTANEOUS

## 2022-03-21 MED ORDER — ALBUTEROL SULFATE HFA 108 (90 BASE) MCG/ACT IN AERS: 1.0000 | INHALATION_SPRAY | RESPIRATORY_TRACT | Status: DC | PRN

## 2022-03-21 MED ORDER — POLYVINYL ALCOHOL 1.4 % OP SOLN
2.0000 [drp] | Freq: Three times a day (TID) | OPHTHALMIC | Status: DC
  Administered 2022-03-22: 2 [drp] via OPHTHALMIC
  Filled 2022-03-21 (×2): qty 15

## 2022-03-21 MED ORDER — ATENOLOL 25 MG PO TABS: 50.0000 mg | ORAL_TABLET | Freq: Every day | ORAL | Status: DC

## 2022-03-21 MED ORDER — NITROGLYCERIN 0.4 MG SL SUBL: 0.4000 mg | SUBLINGUAL_TABLET | SUBLINGUAL | Status: DC | PRN

## 2022-03-21 MED ORDER — HYDRALAZINE HCL 20 MG/ML IJ SOLN: 5.0000 mg | INTRAMUSCULAR | Status: DC | PRN

## 2022-03-21 MED ORDER — SODIUM CHLORIDE 0.9% FLUSH
3.0000 mL | Freq: Two times a day (BID) | INTRAVENOUS | Status: DC
  Administered 2022-03-21 – 2022-03-24 (×7): 3 mL via INTRAVENOUS

## 2022-03-21 MED ORDER — SODIUM CHLORIDE 0.9% FLUSH: 3.0000 mL | INTRAVENOUS | Status: DC | PRN

## 2022-03-21 MED ORDER — SODIUM CHLORIDE 0.9 % IV SOLN: 250.0000 mL | INTRAVENOUS | Status: DC | PRN

## 2022-03-21 MED ORDER — ATORVASTATIN CALCIUM 40 MG PO TABS
40.0000 mg | ORAL_TABLET | Freq: Every day | ORAL | Status: DC
  Administered 2022-03-21 – 2022-03-24 (×4): 40 mg via ORAL
  Filled 2022-03-21 (×4): qty 1

## 2022-03-21 MED ORDER — ASPIRIN 81 MG PO TBEC
81.0000 mg | DELAYED_RELEASE_TABLET | Freq: Every day | ORAL | Status: DC
  Administered 2022-03-22 – 2022-03-25 (×4): 81 mg via ORAL
  Filled 2022-03-21 (×4): qty 1

## 2022-03-21 MED ORDER — ASPIRIN 81 MG PO CHEW
243.0000 mg | CHEWABLE_TABLET | Freq: Once | ORAL | Status: AC
  Administered 2022-03-21: 243 mg via ORAL
  Filled 2022-03-21: qty 3

## 2022-03-21 MED ORDER — HEPARIN (PORCINE) 25000 UT/250ML-% IV SOLN
1150.0000 [IU]/h | INTRAVENOUS | Status: DC
  Administered 2022-03-21: 1000 [IU]/h via INTRAVENOUS
  Administered 2022-03-23: 1150 [IU]/h via INTRAVENOUS
  Filled 2022-03-21 (×3): qty 250

## 2022-03-21 MED ORDER — HEPARIN SODIUM (PORCINE) 5000 UNIT/ML IJ SOLN
4000.0000 [IU] | Freq: Once | INTRAMUSCULAR | Status: AC
  Administered 2022-03-21: 4000 [IU] via INTRAVENOUS
  Filled 2022-03-21: qty 1

## 2022-03-21 MED ORDER — ALBUTEROL SULFATE (2.5 MG/3ML) 0.083% IN NEBU: 2.5000 mg | INHALATION_SOLUTION | RESPIRATORY_TRACT | Status: DC | PRN

## 2022-03-21 MED ORDER — FLUTICASONE PROPIONATE 50 MCG/ACT NA SUSP
1.0000 | Freq: Every day | NASAL | Status: DC
  Administered 2022-03-22: 1 via NASAL
  Filled 2022-03-21 (×2): qty 16

## 2022-03-21 NOTE — Progress Notes (Signed)
ANTICOAGULATION CONSULT NOTE - Initial Consult  Pharmacy Consult for heparin Indication: chest pain/ACS  Allergies  Allergen Reactions   Nsaids Other (See Comments)    Per nephrologist    Patient Measurements: Height: '5\' 6"'$  (167.6 cm) Weight: 83 kg (183 lb) IBW/kg (Calculated) : 59.3 Heparin Dosing Weight: 77 kg  Vital Signs: Temp: 98 F (36.7 C) (10/09 1105) Temp Source: Oral (10/09 1105) BP: 139/51 (10/09 1430) Pulse Rate: 57 (10/09 1430)  Labs: Recent Labs    03/21/22 1144 03/21/22 1331  HGB 11.6*  --   HCT 35.6*  --   PLT 147*  --   CREATININE 1.16*  --   TROPONINIHS 216* 218*    Estimated Creatinine Clearance: 37.1 mL/min (A) (by C-G formula based on SCr of 1.16 mg/dL (H)).   Medical History: Past Medical History:  Diagnosis Date   Atrial flutter (HCC)    CKD (chronic kidney disease) stage 2, GFR 60-89 ml/min    Essential hypertension, benign    Type 2 diabetes mellitus (HCC)     Medications:  (Not in a hospital admission)   Assessment: Pharmacy consulted to dose heparin in patient with chest pain/ACS.  Patient is not on anticoagulation prior to admission.  CBC WNL Trop 218  Goal of Therapy:  Heparin level 0.3-0.7 units/ml Monitor platelets by anticoagulation protocol: Yes   Plan:  Give 4000 units bolus x 1 Start heparin infusion at 1000 units/hr Check anti-Xa level in 8 hours and daily while on heparin Continue to monitor H&H and platelets  Ramond Craver 03/21/2022,3:21 PM

## 2022-03-21 NOTE — ED Notes (Signed)
Date and time results received: 03/21/22 12:51 PM  (use smartphrase ".now" to insert current time)  Test: Trop Critical Value: 216   Name of Provider Notified: Dr. Rogene Houston  Orders Received? Or Actions Taken?: see chart

## 2022-03-21 NOTE — Progress Notes (Signed)
[facility] for heparin Indication: chest pain/ACS Brief A/P: Heparin level within goal range Continue Heparin at current rate   Allergies  Allergen Reactions   Nsaids Other (See Comments)    Per nephrologist    Patient Measurements: Height: '5\' 6"'$  (167.6 cm) Weight: 83 kg (183 lb) IBW/kg (Calculated) : 59.3 Heparin Dosing Weight: 77 kg  Vital Signs: Temp: 97.8 F (36.6 C) (10/09 2343) Temp Source: Oral (10/09 2343) BP: 130/89 (10/09 2343) Pulse Rate: 68 (10/09 2343)  Labs: Recent Labs    03/21/22 1144 03/21/22 1331 03/21/22 2330  HGB 11.6*  --   --   HCT 35.6*  --   --   PLT 147*  --   --   HEPARINUNFRC  --   --  0.37  CREATININE 1.16*  --   --   TROPONINIHS 216* 218*  --      Estimated Creatinine Clearance: 37.1 mL/min (A) (by C-G formula based on SCr of 1.16 mg/dL (H)).  Assessment: 88 y.o. female with chest pain for heparin   Goal of Therapy:  Heparin level 0.3-0.7 units/ml Monitor platelets by anticoagulation protocol: Yes   Plan:  Continue Heparin at current rate   Valene Villa, Bronson Curb 03/21/2022,11:56 PM

## 2022-03-21 NOTE — ED Triage Notes (Signed)
Pt to ED c/o generalized weakness x 2 days, chest pain that started last night. Reports intermitted radiating to shoulder. Reports intermittent SHOB, pt also reports cbg 72 this morning and ate a sweet snack.

## 2022-03-21 NOTE — ED Provider Notes (Signed)
[facility] EMERGENCY DEPARTMENT Provider Note   CSN: [id number] Arrival date & time: 03/21/22  1048     History  Chief Complaint  Patient presents with   Weakness   Chest Pain    Audrey Walker is a 88 y.o. female.  Patient started with some chest pain about 2:00 in the morning.  Left anterior chest she awoke at that time that discomfort took a baby aspirin went back to sleep but woke again at 7 in the morning and the pain was there pain slowly got somewhat worse it would wax and wane radiated into the left arm.  Patient never considered to be severe pain.  She also reported that her blood sugar was like 72 this morning she was kind of alarmed by that she did eat some candy and it came back up.  Also has had some intermittent shortness of breath.  Oxygen sats here though are 97% respirations 18 patient does not appear to be any in any discomfort currently.  Temp was 98.  Patient states that shortly after arrival here the chest pain resolved.  Patient followed by cardiology here in [place] has a known history of hypertension diabetes chronic kidney disease atrial flutter/fibrillation patient has a Vascor of 5 but has consistently declined anticoagulation.  Patient was seen last by cardiology November 22, 2021.  Patient is on atenolol.  Not on any blood thinners.  Family and patient admits that she does have some memory problems here but patient able to answer all these questions with herself.       Home Medications Prior to Admission medications   Medication Sig Start Date End Date Taking? Authorizing Provider  acetaminophen (TYLENOL) 500 MG tablet Take 500 mg by mouth every 6 (six) hours as needed for mild pain.   Yes [provider]  albuterol (VENTOLIN HFA) 108 (90 Base) MCG/ACT inhaler Inhale 1-2 puffs into the lungs every 4 (four) hours as needed for wheezing or shortness of breath. 01/27/22  Yes [provider]  Ascorbic Acid (VITAMIN C) 1000 MG tablet Take 2,000 mg  by mouth daily.   Yes [provider]  atenolol (TENORMIN) 50 MG tablet Take 1 tablet (50 mg total) by mouth daily. 08/11/14  Yes Satira Sark, MD  Biotin w/ Vitamins C & E (HAIR/SKIN/NAILS PO) Take 1 tablet by mouth daily.   Yes [provider]  Cholecalciferol (VITAMIN D3) 1.25 MG (50000 UT) CAPS Take 1 capsule by mouth daily.   Yes [provider]  Cyanocobalamin (VITAMIN B 12 PO) Take 1 tablet by mouth daily.    Yes [provider]  enalapril (VASOTEC) 20 MG tablet Take 20 mg by mouth daily.   Yes [provider]  fish oil-omega-3 fatty acids 1000 MG capsule Take 1 g by mouth daily.   Yes [provider]  fluticasone (FLONASE) 50 MCG/ACT nasal spray Place 1 spray into both nostrils daily. 02/07/22  Yes Isla Pence, MD  milk thistle 175 MG tablet Take 175 mg by mouth daily.   Yes [provider]  Multiple Vitamins-Minerals (ZINC PO) Take 50 mg by mouth daily.   Yes [provider]  TRESIBA FLEXTOUCH 200 UNIT/ML FlexTouch Pen Inject 18 Units into the skin at bedtime. 02/02/22  Yes [provider]  Jay Schlichter Oil (ARTIFICIAL TEARS) ointment Place 2 drops into both eyes 3 (three) times daily.   Yes [provider]  B-D ULTRAFINE III SHORT PEN 31G X 8 MM MISC Inject 1 Syringe  into the skin daily. 09/30/19   [provider]  Blood Glucose Monitoring Suppl (ONE TOUCH ULTRA 2) w/Device KIT  07/02/19   [provider]      Allergies    Nsaids    Review of Systems   Review of Systems  Constitutional:  Negative for chills and fever.  HENT:  Negative for ear pain and sore throat.   Eyes:  Negative for pain and visual disturbance.  Respiratory:  Positive for shortness of breath. Negative for cough.   Cardiovascular:  Positive for chest pain. Negative for palpitations.  Gastrointestinal:  Negative for abdominal pain and vomiting.  Genitourinary:  Negative for dysuria and  hematuria.  Musculoskeletal:  Negative for arthralgias and back pain.  Skin:  Negative for color change and rash.  Neurological:  Negative for seizures and syncope.  All other systems reviewed and are negative.   Physical Exam Updated Vital Signs BP (!) 155/58   Pulse 61   Temp 98.1 F (36.7 C) (Oral)   Resp 19   Ht 1.676 m (5' 6")   Wt 83 kg   SpO2 98%   BMI 29.54 kg/m  Physical Exam Vitals and nursing note reviewed.  Constitutional:      General: She is not in acute distress.    Appearance: She is well-developed. She is not ill-appearing, toxic-appearing or diaphoretic.  HENT:     Head: Normocephalic and atraumatic.  Eyes:     Extraocular Movements: Extraocular movements intact.     Conjunctiva/sclera: Conjunctivae normal.     Pupils: Pupils are equal, round, and reactive to light.  Cardiovascular:     Rate and Rhythm: Normal rate and regular rhythm.     Heart sounds: No murmur heard. Pulmonary:     Effort: Pulmonary effort is normal. No respiratory distress.     Breath sounds: Normal breath sounds. No decreased breath sounds, wheezing, rhonchi or rales.  Chest:     Chest wall: No tenderness.  Abdominal:     Palpations: Abdomen is soft.     Tenderness: There is no abdominal tenderness.  Musculoskeletal:        General: No swelling.     Cervical back: Normal range of motion and neck supple.     Right lower leg: No tenderness. No edema.     Left lower leg: Tenderness present. No edema.  Skin:    General: Skin is warm and dry.     Capillary Refill: Capillary refill takes less than 2 seconds.  Neurological:     Mental Status: She is alert.  Psychiatric:        Mood and Affect: Mood normal.     ED Results / Procedures / Treatments   Labs (all labs ordered are listed, but only abnormal results are displayed) Labs Reviewed  CBC WITH DIFFERENTIAL/PLATELET - Abnormal; Notable for the following components:      Result Value   RBC 3.73 (*)    Hemoglobin 11.6 (*)     HCT 35.6 (*)    Platelets 147 (*)    All other components within normal limits  COMPREHENSIVE METABOLIC PANEL - Abnormal; Notable for the following components:   Glucose, Bld 103 (*)    BUN 30 (*)    Creatinine, Ser 1.16 (*)    Total Protein 6.3 (*)    GFR, Estimated 46 (*)    All other components within normal limits  URINALYSIS, ROUTINE W REFLEX MICROSCOPIC - Abnormal; Notable for the following components:   Protein,  ur 30 (*)    Leukocytes,Ua TRACE (*)    Bacteria, UA MANY (*)    All other components within normal limits  CBG MONITORING, ED - Abnormal; Notable for the following components:   Glucose-Capillary 106 (*)    All other components within normal limits  TROPONIN I (HIGH SENSITIVITY) - Abnormal; Notable for the following components:   Troponin I (High Sensitivity) 216 (*)    All other components within normal limits  TROPONIN I (HIGH SENSITIVITY) - Abnormal; Notable for the following components:   Troponin I (High Sensitivity) 218 (*)    All other components within normal limits  HEPARIN LEVEL (UNFRACTIONATED)  CBG MONITORING, ED    EKG EKG Interpretation  Date/Time:  Monday March 21 2022 11:12:24 EDT Ventricular Rate:  61 PR Interval:    QRS Duration: 87 QT Interval:  414 QTC Calculation: 417 R Axis:   34 Text Interpretation: Atrial fibrillation Low voltage, precordial leads Confirmed by Fredia Sorrow [phone]) on 03/21/2022 11:18:13 AM  Radiology DG Chest 2 View  Result Date: 03/21/2022 CLINICAL DATA:  Chest pain EXAM: CHEST - 2 VIEW COMPARISON:  02/07/2022 FINDINGS: Artifact from EKG leads. Normal heart size and mediastinal contours. No acute infiltrate or edema. No effusion or pneumothorax. No acute osseous findings. IMPRESSION: No evidence of active disease. Electronically Signed   By: Jorje Guild M.D.   On: 03/21/2022 11:33    Procedures Procedures    Medications Ordered in ED Medications  heparin ADULT infusion 100 units/mL (25000  units/226m) (1,000 Units/hr Intravenous New Bag/Given 03/21/22 1548)  aspirin chewable tablet 243 mg (243 mg Oral Given 03/21/22 1408)  heparin injection 4,000 Units (4,000 Units Intravenous Given 03/21/22 1549)    ED Course/ Medical Decision Making/ A&P                           Medical Decision Making Amount and/or Complexity of Data Reviewed Labs: ordered. Radiology: ordered.  Risk OTC drugs. Prescription drug management. Decision regarding hospitalization.   CRITICAL CARE Performed by: SFredia SorrowTotal critical care time: 45 minutes Critical care time was exclusive of separately billable procedures and treating other patients. Critical care was necessary to treat or prevent imminent or life-threatening deterioration. Critical care was time spent personally by me on the following activities: development of treatment plan with patient and/or surrogate as well as nursing, discussions with consultants, evaluation of patient's response to treatment, examination of patient, obtaining history from patient or surrogate, ordering and performing treatments and interventions, ordering and review of laboratory studies, ordering and review of radiographic studies, pulse oximetry and re-evaluation of patient's condition.  Patient's initial troponin was 216.  Repeat 218.  This is somewhat stable.  But patient with a very good history for an acute cardiac event.  Urinalysis negative for urinary tract infection CBC no leukocytosis hemoglobin 149.2complete metabolic panel GFR 46 electrolytes normal blood sugar here 106 and also on the complete metabolic panel patient's blood sugar was 103.  Chest x-ray no evidence of active disease.  EKG initially seemed to have rate controlled atrial fibrillation cardiac monitoring however shows that it appears to be more of a sinus rhythm.  Discussed with TGolden Hurterfrom cardiology they are recommending considering this to be non-STEMI and heparinizing and  admission and echocardiogram.  And also was given the rest of her baby aspirin when she got here.  IV heparin has been ordered bolus and infusion.  With hospitalist who will  see patient for admission.  They may opt to admit the patient at [facility].  Patient currently is pain-free.  Final Clinical Impression(s) / ED Diagnoses Final diagnoses:  NSTEMI (non-ST elevated myocardial infarction) [facility])    Rx / DC Orders ED Discharge Orders     None         Fredia Sorrow, MD 03/21/22 1557

## 2022-03-21 NOTE — ED Notes (Signed)
Cbg 64; patient given apple juice. Patient has diet order and dietary called for meal trays and  states meals would be in the Ed in 15 minutes.

## 2022-03-21 NOTE — H&P (Addendum)
History and Physical    Patient: Audrey Walker RXV:[id number] DOB: 07-04-1932 DOA: 03/21/2022 DOS: the patient was seen and examined on 03/21/2022 PCP: Celene Squibb, MD  Patient coming from: Home - lives alone; NOK: Daughter, Clois Comber, [phone]   Chief Complaint: Chest pain  HPI: Audrey Walker is a 88 y.o. female with medical history significant of HTN, DM, stage 2 CKD, and afib (refused AC) presenting with chest pain.  She reports that she was somewhat weak and ataxic Saturday and Sunday.  She went to bed last night with her glucose >200 (last A1c was reported to be 6.1) but awoke feeling funny about 2AM.  She had tingling in her hands and pain in both arms.  She checked her glucose and it was 64.  She ate some chocolate and then honey and rechecked and it eventually improved.  In the meantime, she noticed a "twinge" in her left chest that came and went until it resolved spontaneously in the ER.  She did take an ASA - but does not take this regularly because of a reported facial swelling intolerance associated with NSAIDs (the chart reports that she isn't taking them per nephrology).  She has not had recurrence since then.    Her only prior cardiac evaluation was in 2014 when she had a negative MPS and a normal echo (other than grade 1 diastolic dysfunction).  She has periodic ankle edema only when she is on her feet for too long.    ER Course:  NSTEMI - Dr. Radford Pax recommends admission here, echo.   Pretty good story, stable troponins.  Resolved spontaneously in the ER.       Review of Systems: As mentioned in the history of present illness. All other systems reviewed and are negative. Past Medical History:  Diagnosis Date   Atrial flutter (HCC)    CKD (chronic kidney disease) stage 2, GFR 60-89 ml/min    Essential hypertension, benign    Type 2 diabetes mellitus ([place])    No past surgical history on file. Social History:  reports that she has never smoked. She has never used  smokeless tobacco. She reports that she does not drink alcohol and does not use drugs.  Allergies  Allergen Reactions   Nsaids Other (See Comments)    Per nephrologist    Family History  Problem Relation Age of Onset   Hypertension Other    Diabetes Mellitus II Other     Prior to Admission medications   Medication Sig Start Date End Date Taking? Authorizing Provider  acetaminophen (TYLENOL) 500 MG tablet Take 500 mg by mouth every 6 (six) hours as needed for mild pain.   Yes [provider]  albuterol (VENTOLIN HFA) 108 (90 Base) MCG/ACT inhaler Inhale 1-2 puffs into the lungs every 4 (four) hours as needed for wheezing or shortness of breath. 01/27/22  Yes [provider]  Ascorbic Acid (VITAMIN C) 1000 MG tablet Take 2,000 mg by mouth daily.   Yes [provider]  atenolol (TENORMIN) 50 MG tablet Take 1 tablet (50 mg total) by mouth daily. 08/11/14  Yes Satira Sark, MD  Biotin w/ Vitamins C & E (HAIR/SKIN/NAILS PO) Take 1 tablet by mouth daily.   Yes [provider]  Cholecalciferol (VITAMIN D3) 1.25 MG (50000 UT) CAPS Take 1 capsule by mouth daily.   Yes [provider]  Cyanocobalamin (VITAMIN B 12 PO) Take 1 tablet by mouth daily.    Yes [provider]  enalapril (  VASOTEC) 20 MG tablet Take 20 mg by mouth daily.   Yes [provider]  fish oil-omega-3 fatty acids 1000 MG capsule Take 1 g by mouth daily.   Yes [provider]  fluticasone (FLONASE) 50 MCG/ACT nasal spray Place 1 spray into both nostrils daily. 02/07/22  Yes Isla Pence, MD  milk thistle 175 MG tablet Take 175 mg by mouth daily.   Yes [provider]  Multiple Vitamins-Minerals (ZINC PO) Take 50 mg by mouth daily.   Yes [provider]  TRESIBA FLEXTOUCH 200 UNIT/ML FlexTouch Pen Inject 18 Units into the skin at bedtime. 02/02/22  Yes [provider]  Jay Schlichter Oil (ARTIFICIAL TEARS) ointment Place  2 drops into both eyes 3 (three) times daily.   Yes [provider]  B-D ULTRAFINE III SHORT PEN 31G X 8 MM MISC Inject 1 Syringe into the skin daily. 09/30/19   [provider]  Blood Glucose Monitoring Suppl (ONE TOUCH ULTRA 2) w/Device KIT  07/02/19   [provider]    Physical Exam: Vitals:   03/21/22 1430 03/21/22 1500 03/21/22 1530 03/21/22 1542  BP: (!) 139/51 136/60 (!) 155/58   Pulse: (!) 57 (!) 58 61   Resp: (!) $RemoveB'9 16 19   'eVpOOapZ$ Temp:    98.1 F (36.7 C)  TempSrc:    Oral  SpO2: 97% 98% 98%   Weight:      Height:       General:  Appears calm and comfortable and is in NAD Eyes:  PERRL, EOMI, normal lids, iris ENT:  grossly normal hearing, lips & tongue, mmm; poor/absent dentition Neck:  no LAD, masses or thyromegaly Cardiovascular:  RRR, no m/r/g. Trace LE edema.  Respiratory:   CTA bilaterally with no wheezes/rales/rhonchi.  Normal respiratory effort. Abdomen:  soft, NT, ND Skin:  no rash or induration seen on limited exam Musculoskeletal:  grossly normal tone BUE/BLE, good ROM, no bony abnormality Psychiatric:  grossly normal mood and affect, speech fluent and appropriate, AOx3, mildly confused Neurologic:  CN 2-12 grossly intact, moves all extremities in coordinated fashion   Radiological Exams on Admission: Independently reviewed - see discussion in A/P where applicable  DG Chest 2 View  Result Date: 03/21/2022 CLINICAL DATA:  Chest pain EXAM: CHEST - 2 VIEW COMPARISON:  02/07/2022 FINDINGS: Artifact from EKG leads. Normal heart size and mediastinal contours. No acute infiltrate or edema. No effusion or pneumothorax. No acute osseous findings. IMPRESSION: No evidence of active disease. Electronically Signed   By: Jorje Guild M.D.   On: 03/21/2022 11:33    EKG: Independently reviewed.  Afib with rate 61; no evidence of acute ischemia   Labs on Admission: I have personally reviewed the available labs and imaging studies at the time of the  admission.  Pertinent labs:    BUN 30/Creatinine 1.16/GFR 46 HS troponin 216, 218 WBC 7 Hgb 11.6 Platelets 147 UA: trace LE, 30 protein, many bacteria   Assessment and Plan: Principal Problem:   NSTEMI (non-ST elevated myocardial infarction) ([place]) Active Problems:   Type 2 diabetes mellitus (HCC)   Atrial flutter (HCC)   Essential hypertension, benign   Chronic kidney disease, stage 3a ([place])   DNR (do not resuscitate)    NSTEMI -Patient with substernal chest pain that came on acutely with overnight hypoglycemia and resolved spontaneously. -2/3 features suggestive of atypical angina.  -CXR unremarkable.   -Initial HS troponin significantly elevated but repeat with negative delta.   -EKG with no apparent  STEMI. -TIMI risk score is 3; which predicts a 14 days risk of death, recurrent MI, or urgent revascularization of 13%.  -Will admit to cardiac telemetry since the patient has positive troponins, necessitating acute intervention. -Admission at [facility] vs. [facility] was discussed and the patient prefers to go to [facility] to complete her evaluation; this is reasonable given her high likelihood for cath. -Repeat EKG in AM -Risk factor stratification with HgbA1c and FLP; will also check TSH -Cardiology consultation requested -Echo ordered -Patient appears likely to need invasive evaluation (cardiac catheterization) based on concerning history, elevated troponin  -Start ASA 81 mg daily for now; given her reported intolerance, she is likely to need to transition to Plavix (she is agreeable) -NTG for symptom relief (although there is no mortality benefit) -Continue beta blocker  -Will plan to continue Heparin drip  -Supplemental O2 -Start Lipitor 40 mg qhs for now  HTN -Continue beta-blocker and ACE -Will also add prn hydralazine  HLD -Check lipids (none in Epic) -Start empiric Lipitor  DM -Last A1c in Epic was 6.5 in 05/2020 -She reports hypoglycemia last night -Will decrease Tresiba  (Sem-glee substitution) to 10 units qhs for now -Will cover with sensitive-scale SSI for now  Afib -Rate controlled with atenolol -She has consistently refused AC and continues to refuse this -Ongoing discussion may be warranted but this is currently moot given her Heparin drip  Stage 3a CKD -Appears to be stable at this time -Attempt to avoid nephrotoxic medications -Recheck BMP in AM   DNR -I have discussed code status with the patient and her daughter and  they are in agreement that the patient would not desire resuscitation and would prefer to die a natural death should that situation arise. -She will need a gold out of facility DNR form at the time of discharge      Advance Care Planning:   Code Status: DNR   Consults: Cardiology  DVT Prophylaxis: Heparin drip  Family Communication: Daughter and grandson were present throughout evaluation  Severity of Illness: The appropriate patient status for this patient is INPATIENT. Inpatient status is judged to be reasonable and necessary in order to provide the required intensity of service to ensure the patient's safety. The patient's presenting symptoms, physical exam findings, and initial radiographic and laboratory data in the context of their chronic comorbidities is felt to place them at high risk for further clinical deterioration. Furthermore, it is not anticipated that the patient will be medically stable for discharge from the hospital within 2 midnights of admission.   * I certify that at the point of admission it is my clinical judgment that the patient will require inpatient hospital care spanning beyond 2 midnights from the point of admission due to high intensity of service, high risk for further deterioration and high frequency of surveillance required.*  Author: Karmen Bongo, MD 03/21/2022 4:38 PM  For on call review [email or web address].

## 2022-03-21 NOTE — Consult Note (Addendum)
Cardiology Consultation:   Patient ID: Audrey Walker MRN: [id number]; DOB: 1932-07-05  Admit date: 03/21/2022 Date of Consult: 03/21/2022  Primary Care Provider: Celene Squibb, MD [facility] Cardiologist: Rozann Lesches, MD  [facility] Electrophysiologist:  None    Patient Profile:   Audrey Walker is a 88 y.o. female with a hx of paroxysmal atrial flutter, hypertension, type 2 diabetes mellitus and CKD stage II who is being seen today for the evaluation of chest pain at the request of Ria Comment,, MD.  History of Present Illness:   Audrey Walker is an 88 year old female with a history of atrial flutter, hypertension, type 2 diabetes mellitus and CKD followed by Dr. Domenic Polite in cardiology.  She was last seen by Theresia Lo, PA on 11/22/2021 at which time she was doing well without any complaints.  Despite a CHA2DS2-VASc score of 5 the patient has declined anticoagulation consistently at each office visit.  She was in her usual state of health until last night when she awakened with chest pain as well as achiness in her left arm.  She tells me that she wakes up several times nightly to use the restroom and last night she woke up for unknown reason and then got up to go the bathroom.  She says she did not feel quite right at that time but went back to bed and then started having chest discomfort that she is described as an achiness over her left chest along with arm numbness and achiness as well.  This persisted throughout the night and again this morning and she decided to come to the emergency room.  She tells me her chest pain was resolved by the time she got to the ER but her left arm was still hurting some.  In the ER sensitivity troponin was 216 and 218.  Serum creatinine 1.16 and potassium 4.2.  Hemoglobin was 11.6.  EKG demonstrated normal sinus rhythm with no acute ST changes.  Currently she is pain-free.  Cardiology has been asked to evaluate.  She tells me that she has never had  any chest pain like this before or achiness in her arm before.  Currently she is pain-free.  She prefers to go to [facility] for further evaluation instead of being admitted here.   Past Medical History:  Diagnosis Date   Atrial flutter (HCC)    CKD (chronic kidney disease) stage 2, GFR 60-89 ml/min    Essential hypertension, benign    Type 2 diabetes mellitus ([place])     No past surgical history on file.   Home Medications:  Prior to Admission medications   Medication Sig Start Date End Date Taking? Authorizing Provider  acetaminophen (TYLENOL) 500 MG tablet Take 500 mg by mouth every 6 (six) hours as needed for mild pain.   Yes [provider]  albuterol (VENTOLIN HFA) 108 (90 Base) MCG/ACT inhaler Inhale 1-2 puffs into the lungs every 4 (four) hours as needed for wheezing or shortness of breath. 01/27/22  Yes [provider]  Ascorbic Acid (VITAMIN C) 1000 MG tablet Take 2,000 mg by mouth daily.   Yes [provider]  atenolol (TENORMIN) 50 MG tablet Take 1 tablet (50 mg total) by mouth daily. 08/11/14  Yes Satira Sark, MD  Biotin w/ Vitamins C & E (HAIR/SKIN/NAILS PO) Take 1 tablet by mouth daily.   Yes [provider]  Cholecalciferol (VITAMIN D3) 1.25 MG (50000 UT) CAPS Take 1 capsule by mouth daily.   Yes [provider]  Cyanocobalamin (VITAMIN B 12 PO) Take 1 tablet by mouth daily.    Yes [provider]  enalapril (VASOTEC) 20 MG tablet Take 20 mg by mouth daily.   Yes [provider]  fish oil-omega-3 fatty acids 1000 MG capsule Take 1 g by mouth daily.   Yes [provider]  fluticasone (FLONASE) 50 MCG/ACT nasal spray Place 1 spray into both nostrils daily. 02/07/22  Yes Isla Pence, MD  milk thistle 175 MG tablet Take 175 mg by mouth daily.   Yes [provider]  Multiple Vitamins-Minerals (ZINC PO) Take 50 mg by mouth daily.   Yes [provider]  TRESIBA FLEXTOUCH 200 UNIT/ML  FlexTouch Pen Inject 18 Units into the skin at bedtime. 02/02/22  Yes [provider]  Jay Schlichter Oil (ARTIFICIAL TEARS) ointment Place 2 drops into both eyes 3 (three) times daily.   Yes [provider]  B-D ULTRAFINE III SHORT PEN 31G X 8 MM MISC Inject 1 Syringe into the skin daily. 09/30/19   [provider]  Blood Glucose Monitoring Suppl (ONE TOUCH ULTRA 2) w/Device KIT  07/02/19   [provider]    Inpatient Medications: Scheduled Meds:  heparin  4,000 Units Intravenous Once   Continuous Infusions:  heparin     PRN Meds:   Allergies:    Allergies  Allergen Reactions   Nsaids Other (See Comments)    Per nephrologist    Social History:   Social History   Socioeconomic History   Marital status: Widowed    Spouse name: Not on file   Number of children: Not on file   Years of education: Not on file   Highest education level: Not on file  Occupational History   Not on file  Tobacco Use   Smoking status: Never   Smokeless tobacco: Never  Vaping Use   Vaping Use: Never used  Substance and Sexual Activity   Alcohol use: No   Drug use: No   Sexual activity: Not on file  Other Topics Concern   Not on file  Social History Narrative   Husband passed away from Cancer in 02/12/2012   Social Determinants of Health   Financial Resource Strain: Not on file  Food Insecurity: Not on file  Transportation Needs: Not on file  Physical Activity: Not on file  Stress: Not on file  Social Connections: Not on file  Intimate Partner Violence: Not on file    Family History:    Family History  Problem Relation Age of Onset   Hypertension Other    Diabetes Mellitus II Other      ROS:  Please see the history of present illness.   All other ROS reviewed and negative.     Physical Exam/Data:   Vitals:   03/21/22 1430 03/21/22 1500 03/21/22 1530 03/21/22 1542  BP: (!) 139/51 136/60 (!) 155/58   Pulse: (!) 57 (!) 58 61    Resp: (!) $RemoveB'9 16 19   'fqZNxwIj$ Temp:    98.1 F (36.7 C)  TempSrc:    Oral  SpO2: 97% 98% 98%   Weight:      Height:       No intake or output data in the 24 hours ending 03/21/22 1548    03/21/2022   11:06 AM 11/22/2021    1:21 PM 12/10/2020    9:04 AM  Last 3 Weights  Weight (lbs) 183 lb 182 lb 177 lb  Weight (kg) 83.008 kg  82.555 kg 80.287 kg     Body mass index is 29.54 kg/m.  General:  Well nourished, well developed, in no acute distress HEENT: normal Lymph: no adenopathy Neck: no JVD Endocrine:  No thryomegaly Vascular: No carotid bruits; FA pulses 2+ bilaterally without bruits  Cardiac:  normal S1, S2; RRR; no murmur  Lungs:  clear to auscultation bilaterally, no wheezing, rhonchi or rales  Abd: soft, nontender, no hepatomegaly  Ext: no edema Musculoskeletal:  No deformities, BUE and BLE strength normal and equal Skin: warm and dry  Neuro:  CNs 2-12 intact, no focal abnormalities noted Psych:  Normal affect   EKG:  The EKG was personally reviewed and demonstrates: Normal sinus rhythm with no acute ST changes Telemetry:  Telemetry was personally reviewed and demonstrates: Normal sinus rhythm  Relevant CV Studies: None  Laboratory Data:  High Sensitivity Troponin:   Recent Labs  Lab 03/21/22 1144 03/21/22 1331  TROPONINIHS 216* 218*     Chemistry Recent Labs  Lab 03/21/22 1144  NA 143  K 4.2  CL 107  CO2 29  GLUCOSE 103*  BUN 30*  CREATININE 1.16*  CALCIUM 9.5  GFRNONAA 46*  ANIONGAP 7    Recent Labs  Lab 03/21/22 1144  PROT 6.3*  ALBUMIN 3.8  AST 40  ALT 35  ALKPHOS 62  BILITOT 1.0   Hematology Recent Labs  Lab 03/21/22 1144  WBC 7.0  RBC 3.73*  HGB 11.6*  HCT 35.6*  MCV 95.4  MCH 31.1  MCHC 32.6  RDW 12.1  PLT 147*   BNPNo results for input(s): "BNP", "PROBNP" in the last 168 hours.  DDimer No results for input(s): "DDIMER" in the last 168 hours.   Radiology/Studies:  DG Chest 2 View  Result Date: 03/21/2022 CLINICAL DATA:   Chest pain EXAM: CHEST - 2 VIEW COMPARISON:  02/07/2022 FINDINGS: Artifact from EKG leads. Normal heart size and mediastinal contours. No acute infiltrate or edema. No effusion or pneumothorax. No acute osseous findings. IMPRESSION: No evidence of active disease. Electronically Signed   By: Jorje Guild M.D.   On: 03/21/2022 11:33     Assessment and Plan:   Elevated troponin/NSTEMI -Patient presented with chest pain and left arm pain that awakened her from sleep now with elevated troponin levels of 216 and 218  -her symptoms are very concerning for ACS despite her troponins being flat and mildly elevated  -EKG  is nonischemic. -She is currently pain-free -Agree with IV heparin drip -start ASA $RemoveBe'81mg'dEAFkcEbD$  daily -Continue home dose of atenolol 50 mg daily -2D echo to assess for regional wall motion abnormalities -Start moderate dose statin therapy with atorvastatin 40 mg daily -Further ischemic work-up pending results of repeat high-sensitivity troponin and 2D echo -If echo shows focal wall motion abnormality then plan cardiac cath otherwise could consider coronary CTA  2.  Paroxysmal atrial flutter -Maintaining normal sinus rhythm on exam today -She has consistently refused anticoagulation despite a CHA2DS2-VASc score of 5 on multiple office visits  3.  Hypertension -BP borderline elevated on exam but now will just follow -Sinew atenolol 50 mg daily and Vasotec 20 mg daily     TIMI Risk Score for Unstable Angina or Non-ST Elevation MI:   The patient's TIMI risk score is 4, which indicates a 20% risk of all cause mortality, new or recurrent myocardial infarction or need for urgent revascularization in the next 14 days.     CHA2DS2-VASc Score = 5   This indicates a 7.2% annual risk  of stroke. The patient's score is based upon: CHF History: 0 HTN History: 1 Diabetes History: 1 Stroke History: 0 Vascular Disease History: 0 Age Score: 2 Gender Score: 1         For questions or  updates, please contact [place] Please consult [email or web address] for contact info under    Signed, Fransico Him, MD  03/21/2022 3:48 PM

## 2022-03-22 ENCOUNTER — Inpatient Hospital Stay (HOSPITAL_COMMUNITY): Payer: Medicare HMO

## 2022-03-22 DIAGNOSIS — I10 Essential (primary) hypertension: Secondary | ICD-10-CM | POA: Diagnosis not present

## 2022-03-22 DIAGNOSIS — R079 Chest pain, unspecified: Secondary | ICD-10-CM | POA: Diagnosis not present

## 2022-03-22 DIAGNOSIS — I214 Non-ST elevation (NSTEMI) myocardial infarction: Secondary | ICD-10-CM | POA: Diagnosis not present

## 2022-03-22 DIAGNOSIS — I4892 Unspecified atrial flutter: Secondary | ICD-10-CM | POA: Diagnosis not present

## 2022-03-22 LAB — LIPID PANEL
Cholesterol: 171 mg/dL (ref 0–200)
HDL: 67 mg/dL (ref >40)
LDL Cholesterol: 94 mg/dL (ref 0–99)
Total CHOL/HDL Ratio: 2.6 RATIO
Triglycerides: 48 mg/dL (ref <150)
VLDL: 10 mg/dL (ref 0–40)

## 2022-03-22 LAB — GLUCOSE, CAPILLARY
Glucose-Capillary: 183 mg/dL — ABNORMAL HIGH (ref 70–99)
Glucose-Capillary: 279 mg/dL — ABNORMAL HIGH (ref 70–99)

## 2022-03-22 LAB — CBC
HCT: 36.4 % (ref 36.0–46.0)
Hemoglobin: 11.8 g/dL — ABNORMAL LOW (ref 12.0–15.0)
MCH: 31.1 pg (ref 26.0–34.0)
MCHC: 32.4 g/dL (ref 30.0–36.0)
MCV: 95.8 fL (ref 80.0–100.0)
Platelets: 144 10*3/uL — ABNORMAL LOW (ref 150–400)
RBC: 3.8 MIL/uL — ABNORMAL LOW (ref 3.87–5.11)
RDW: 12 % (ref 11.5–15.5)
WBC: 7.2 10*3/uL (ref 4.0–10.5)
nRBC: 0 % (ref 0.0–0.2)

## 2022-03-22 LAB — BASIC METABOLIC PANEL
Anion gap: 7 (ref 5–15)
BUN: 25 mg/dL — ABNORMAL HIGH (ref 8–23)
CO2: 29 mmol/L (ref 22–32)
Calcium: 9.3 mg/dL (ref 8.9–10.3)
Chloride: 108 mmol/L (ref 98–111)
Creatinine, Ser: 0.95 mg/dL (ref 0.44–1.00)
GFR, Estimated: 58 mL/min — ABNORMAL LOW (ref >60)
Glucose, Bld: 86 mg/dL (ref 70–99)
Potassium: 4 mmol/L (ref 3.5–5.1)
Sodium: 144 mmol/L (ref 135–145)

## 2022-03-22 LAB — TSH: TSH: 0.904 u[IU]/mL (ref 0.350–4.500)

## 2022-03-22 LAB — HEPARIN LEVEL (UNFRACTIONATED): Heparin Unfractionated: 0.32 IU/mL (ref 0.30–0.70)

## 2022-03-22 LAB — ECHOCARDIOGRAM COMPLETE
Area-P 1/2: 3.72 cm2
Calc EF: 56.5 %
Height: 66 in
S' Lateral: 2.6 cm
Single Plane A2C EF: 58.9 %
Single Plane A4C EF: 51.3 %
Weight: 2928 oz

## 2022-03-22 LAB — HEMOGLOBIN A1C
Hgb A1c MFr Bld: 6.3 % — ABNORMAL HIGH (ref 4.8–5.6)
Mean Plasma Glucose: 134.11 mg/dL

## 2022-03-22 LAB — CBG MONITORING, ED
Glucose-Capillary: 76 mg/dL (ref 70–99)
Glucose-Capillary: 70 mg/dL (ref 70–99)

## 2022-03-22 LAB — TROPONIN I (HIGH SENSITIVITY): Troponin I (High Sensitivity): 215 ng/L (ref <18)

## 2022-03-22 MED ORDER — HYDRALAZINE HCL 20 MG/ML IJ SOLN: 10.0000 mg | INTRAMUSCULAR | Status: DC | PRN

## 2022-03-22 MED ORDER — CLOPIDOGREL BISULFATE 75 MG PO TABS
75.0000 mg | ORAL_TABLET | Freq: Every day | ORAL | Status: DC
  Administered 2022-03-22 – 2022-03-24 (×3): 75 mg via ORAL
  Filled 2022-03-22 (×3): qty 1

## 2022-03-22 MED ORDER — METOPROLOL TARTRATE 5 MG/5ML IV SOLN: 5.0000 mg | Freq: Four times a day (QID) | INTRAVENOUS | Status: DC | PRN

## 2022-03-22 MED ORDER — CARVEDILOL 3.125 MG PO TABS
6.2500 mg | ORAL_TABLET | Freq: Two times a day (BID) | ORAL | Status: DC
  Administered 2022-03-22 (×2): 6.25 mg via ORAL
  Filled 2022-03-22 (×2): qty 2

## 2022-03-22 MED ORDER — ORAL CARE MOUTH RINSE: 15.0000 mL | OROMUCOSAL | Status: DC | PRN

## 2022-03-22 MED ORDER — INSULIN GLARGINE-YFGN 100 UNIT/ML ~~LOC~~ SOLN
8.0000 [IU] | Freq: Every day | SUBCUTANEOUS | Status: DC
  Administered 2022-03-22 – 2022-03-24 (×3): 8 [IU] via SUBCUTANEOUS
  Filled 2022-03-22 (×4): qty 0.08

## 2022-03-22 MED ORDER — METOPROLOL TARTRATE 5 MG/5ML IV SOLN
2.5000 mg | Freq: Once | INTRAVENOUS | Status: AC
  Administered 2022-03-22: 2.5 mg via INTRAVENOUS
  Filled 2022-03-22: qty 5

## 2022-03-22 MED ORDER — ISOSORBIDE MONONITRATE ER 60 MG PO TB24
30.0000 mg | ORAL_TABLET | Freq: Every day | ORAL | Status: DC
  Administered 2022-03-22 – 2022-03-23 (×2): 30 mg via ORAL
  Filled 2022-03-22 (×3): qty 1

## 2022-03-22 MED ORDER — METOPROLOL TARTRATE 5 MG/5ML IV SOLN
5.0000 mg | Freq: Once | INTRAVENOUS | Status: AC
  Administered 2022-03-22: 5 mg via INTRAVENOUS
  Filled 2022-03-22: qty 5

## 2022-03-22 NOTE — Progress Notes (Signed)
   03/22/22 2046  Assess: MEWS Score  Temp 98.2 F (36.8 C)  BP (!) 98/58  Pulse Rate (!) 129  Resp 20  Level of Consciousness Alert  SpO2 98 %  O2 Device Room Air  Assess: MEWS Score  MEWS Temp 0  MEWS Systolic 1  MEWS Pulse 2  MEWS RR 0  MEWS LOC 0  MEWS Score 3  MEWS Score Color Yellow  Assess: if the MEWS score is Yellow or Red  Were vital signs taken at a resting state? Yes  Focused Assessment No change from prior assessment  Does the patient meet 2 or more of the SIRS criteria? No  MEWS guidelines implemented *See Row Information* Yes  Treat  MEWS Interventions Administered prn meds/treatments  Pain Scale 0-10  Pain Score 0  Take Vital Signs  Increase Vital Sign Frequency  Yellow: Q 2hr X 2 then Q 4hr X 2, if remains yellow, continue Q 4hrs  Escalate  MEWS: Escalate Yellow: discuss with charge nurse/RN and consider discussing with provider and RRT  Notify: Charge Nurse/RN  Name of Charge Nurse/RN Notified Deeann Dowse, RN  Date Charge Nurse/RN Notified 03/21/22  Time Charge Nurse/RN Notified 2050  Notify: Provider  Provider Name/Title Josephine Cables, MD  Date Provider Notified 03/21/22  Time Provider Notified 2050  Method of Notification  (secure chat)  Notification Reason Change in status  Provider response See new orders  Date of Provider Response 03/21/22  Time of Provider Response 2100  Document  Progress note created (see row info) Yes  Assess: SIRS CRITERIA  SIRS Temperature  0  SIRS Pulse 1  SIRS Respirations  0  SIRS WBC 1  SIRS Score Sum  2

## 2022-03-22 NOTE — Progress Notes (Signed)
PROGRESS NOTE     Audrey Walker, is a 88 y.o. female, DOB - 08-18-1932, JKD:[id number]  Admit date - 03/21/2022   Admitting Physician Yago Ludvigsen Denton Brick, MD  Outpatient Primary MD for the patient is Celene Squibb, MD  LOS - 1  Chief Complaint  Patient presents with   Weakness   Chest Pain        Brief Narrative:  88 y.o. female with medical history significant of HTN, DM, stage 2 CKD, and afib (refused AC) admitted on 03/21/2022 with NSTEMI and chest pains   -Assessment and Plan: 1)NSTEMI--currently chest pain-free Troponin 216 >>>218 >>215 -EKG without acute ST changes -LDL 94, total cholesterol 171 with HDL of 67 -Echo from 03/22/2022 with EF of 60 to 65%, no regional wall motion normalities patient does have grade 1 diastolic dysfunction.  No LVH, no pulmonary hypertension -Left atrium is mildly dilated , -No aortic stenosis or mitral stenosis -Dr Marlou Porch spoke with pt and daughter and they agreed to Med Rx w/o LHC -IV heparin x48 hours at least -Aspirin, Plavix, imdur, Lipitor and Coreg as ordered -Patient will need to be ambulated prior to discharge to make sure she does not have anginal symptoms -If persistent anginal symptoms patient will be willing to undergo LHC at that time  2)PAFib-- CHA2DS2- VASc score   is = 7 (age x 2, DM2, HTN, Female, dCHF, MI)  Which is  equal to = 11.2 % annual risk of stroke  This patients CHA2DS2-VASc Score and unadjusted Ischemic Stroke Rate (% per year) is equal to 11.2 % stroke rate/year from a score of 7 -Patient declines full anticoagulation for stroke prophylaxis -Coreg for rate control -Check TSH -Echo as above with preserved EF and only mild atrial enlargement, and no significant valvular abnormalities  3)DM2--- A1c 6.3.Marland KitchenMarland Kitchen Reflecting excellent diabetic control PTA -Decrease Lantus to 8 units nightly Use Novolog/Humalog Sliding scale insulin with Accu-Cheks/Fingersticks as ordered   4) CKD stage - 3A   creatinine on admission= 1.16,  -Which is close to patient's baseline -Creatinine currently trending down -renally adjust medications, avoid nephrotoxic agents / dehydration  / hypotension  5)Social/Ethics--- discussed with patient and patient's daughter [name] -Patient remains DNR/DNI without  limitations to treatment otherwise  6)HTN--stopped atenolol -Coreg and isosorbide as ordered -IV hydralazine as needed  7) mild chronic anemia--- no bleeding concerns, may be related to CKD -Watch closely with initiation of IV heparin and DAPT  Disposition/Need for in-Hospital Stay- patient unable to be discharged at this time due to --NSTEMI requiring continuous IV heparin drip per ACS protocol  Status is: Inpatient   Disposition: The patient is from: Home              Anticipated d/c is to: Home              Anticipated d/c date is: 2 days              Patient currently is not medically stable to d/c. Barriers: Not Clinically Stable-   Code Status :  -  Code Status: DNR   Family Communication:    Discussed with daughter [name]  DVT Prophylaxis  :   - SCDs /Iv Heparin     Lab Results  Component Value Date   PLT 144 (L) 03/22/2022   Inpatient Medications  Scheduled Meds:  aspirin EC  81 mg Oral Daily   atorvastatin  40 mg Oral q1800   carvedilol  6.25 mg Oral BID WC  clopidogrel  75 mg Oral Daily   enalapril  20 mg Oral Daily   fluticasone  1 spray Each Nare Daily   insulin aspart  0-9 Units Subcutaneous TID WC   insulin glargine-yfgn  10 Units Subcutaneous QHS   polyvinyl alcohol  2 drop Both Eyes TID   sodium chloride flush  3 mL Intravenous Q12H   Continuous Infusions:  sodium chloride     heparin 1,000 Units/hr (03/21/22 1548)   PRN Meds:.sodium chloride, acetaminophen, albuterol, hydrALAZINE, nitroGLYCERIN, ondansetron (ZOFRAN) IV, sodium chloride flush   Anti-infectives (From admission, onward)    None       Subjective: Audrey Walker today has no fevers, no emesis,  No  further chest pain,    Patient's daughter [name] at bedside  No dyspnea -Oxygen off and no hypoxia  Objective: Vitals:   03/22/22 1149 03/22/22 1200 03/22/22 1230 03/22/22 1318  BP:  120/60 (!) 120/51 (!) 151/88  Pulse:  67 66 75  Resp:  13 (!) 22 17  Temp: 98.3 F (36.8 C) 98.3 F (36.8 C)    TempSrc: Oral Oral    SpO2:  100% 97% 98%  Weight:      Height:        Intake/Output Summary (Last 24 hours) at 03/22/2022 1330 Last data filed at 03/22/2022 0739 Gross per 24 hour  Intake --  Output 1350 ml  Net -1350 ml   Filed Weights   03/21/22 1106  Weight: 83 kg    Physical Exam  Gen:- Awake Alert,  in no apparent distress, no conversational dyspnea HEENT:- [place].AT, No sclera icterus Neck-Supple Neck,No JVD,.  Lungs-  CTAB , fair symmetrical air movement CV- S1, S2 normal, regular  Abd-  +ve B.Sounds, Abd Soft, No tenderness,    Extremity/Skin:- No  edema, pedal pulses present  Psych-affect is appropriate, oriented x3 Neuro-no new focal deficits, no tremors  Data Reviewed: I have personally reviewed following labs and imaging studies  CBC: Recent Labs  Lab 03/21/22 1144 03/22/22 0508  WBC 7.0 7.2  NEUTROABS 4.9  --   HGB 11.6* 11.8*  HCT 35.6* 36.4  MCV 95.4 95.8  PLT 147* 962*   Basic Metabolic Panel: Recent Labs  Lab 03/21/22 1144 03/22/22 0508  NA 143 144  K 4.2 4.0  CL 107 108  CO2 29 29  GLUCOSE 103* 86  BUN 30* 25*  CREATININE 1.16* 0.95  CALCIUM 9.5 9.3   GFR: Estimated Creatinine Clearance: 45.3 mL/min (by C-G formula based on SCr of 0.95 mg/dL). Liver Function Tests: Recent Labs  Lab 03/21/22 1144  AST 40  ALT 35  ALKPHOS 62  BILITOT 1.0  PROT 6.3*  ALBUMIN 3.8   HbA1C: Recent Labs    03/22/22 0508  HGBA1C 6.3*   Radiology Studies: ECHOCARDIOGRAM COMPLETE  Result Date: 03/22/2022    ECHOCARDIOGRAM REPORT   Patient Name:   Audrey Walker Date of Exam: 03/22/2022 Medical Rec #:  [id number]     Height:       66.0  in Accession #:    [id number]    Weight:       183.0 lb Date of Birth:  05-01-1933     BSA:          1.926 m Patient Age:    85 years      BP:           146/61 mmHg Patient Gender: F  HR:           69 bpm. Exam Location:  [facility] Procedure: 2D Echo, Color Doppler and Cardiac Doppler Indications:    R07.9* Chest pain, unspecified  History:        Patient has prior history of Echocardiogram examinations, most                 recent 08/23/2012. Arrythmias:Atrial Flutter; Risk                 Factors:Hypertension and Diabetes.  Sonographer:    Raquel Sarna Senior RDCS Referring Phys: [place]  1. Left ventricular ejection fraction, by estimation, is 60 to 65%. The left ventricle has normal function. The left ventricle has no regional wall motion abnormalities. Left ventricular diastolic parameters are consistent with Grade I diastolic dysfunction (impaired relaxation).  2. Right ventricular systolic function is normal. The right ventricular size is normal. There is normal pulmonary artery systolic pressure. The estimated right ventricular systolic pressure is 70.9 mmHg.  3. Left atrial size was mildly dilated.  4. The mitral valve is normal in structure. Trivial mitral valve regurgitation. No evidence of mitral stenosis.  5. Tricuspid valve regurgitation is moderate.  6. The aortic valve is tricuspid. Aortic valve regurgitation is not visualized. No aortic stenosis is present.  7. Pulmonic valve regurgitation is moderate.  8. The inferior vena cava is normal in size with greater than 50% respiratory variability, suggesting right atrial pressure of 3 mmHg. FINDINGS  Left Ventricle: Left ventricular ejection fraction, by estimation, is 60 to 65%. The left ventricle has normal function. The left ventricle has no regional wall motion abnormalities. The left ventricular internal cavity size was normal in size. There is  no left ventricular hypertrophy. Left ventricular diastolic parameters are  consistent with Grade I diastolic dysfunction (impaired relaxation). Right Ventricle: The right ventricular size is normal. No increase in right ventricular wall thickness. Right ventricular systolic function is normal. There is normal pulmonary artery systolic pressure. The tricuspid regurgitant velocity is 2.55 m/s, and  with an assumed right atrial pressure of 3 mmHg, the estimated right ventricular systolic pressure is 62.8 mmHg. Left Atrium: Left atrial size was mildly dilated. Right Atrium: Right atrial size was normal in size. Pericardium: There is no evidence of pericardial effusion. Presence of epicardial fat layer. Mitral Valve: The mitral valve is normal in structure. Trivial mitral valve regurgitation. No evidence of mitral valve stenosis. Tricuspid Valve: The tricuspid valve is normal in structure. Tricuspid valve regurgitation is moderate . No evidence of tricuspid stenosis. Aortic Valve: The aortic valve is tricuspid. Aortic valve regurgitation is not visualized. No aortic stenosis is present. Pulmonic Valve: The pulmonic valve was normal in structure. Pulmonic valve regurgitation is moderate. No evidence of pulmonic stenosis. Aorta: The aortic root is normal in size and structure. Venous: The inferior vena cava is normal in size with greater than 50% respiratory variability, suggesting right atrial pressure of 3 mmHg. IAS/Shunts: No atrial level shunt detected by color flow Doppler.  LEFT VENTRICLE PLAX 2D LVIDd:         3.70 cm     Diastology LVIDs:         2.60 cm     LV e' medial:    4.68 cm/s LV PW:         0.90 cm     LV E/e' medial:  15.7 LV IVS:        0.80 cm     LV  e' lateral:   7.24 cm/s LVOT diam:     2.10 cm     LV E/e' lateral: 10.2 LV SV:         65 LV SV Index:   34 LVOT Area:     3.46 cm  LV Volumes (MOD) LV vol d, MOD A2C: 75.5 ml LV vol d, MOD A4C: 72.7 ml LV vol s, MOD A2C: 31.0 ml LV vol s, MOD A4C: 35.4 ml LV SV MOD A2C:     44.5 ml LV SV MOD A4C:     72.7 ml LV SV MOD BP:       43.0 ml RIGHT VENTRICLE RV S prime:     15.00 cm/s TAPSE (M-mode): 2.3 cm LEFT ATRIUM             Index        RIGHT ATRIUM           Index LA diam:        3.60 cm 1.87 cm/m   RA Area:     20.10 cm LA Vol (A2C):   69.0 ml 35.83 ml/m  RA Volume:   56.40 ml  29.29 ml/m LA Vol (A4C):   54.1 ml 28.09 ml/m LA Biplane Vol: 63.8 ml 33.13 ml/m  AORTIC VALVE LVOT Vmax:   67.20 cm/s LVOT Vmean:  57.100 cm/s LVOT VTI:    0.188 m  AORTA Ao Root diam: 2.90 cm Ao Asc diam:  3.30 cm MITRAL VALVE               TRICUSPID VALVE MV Area (PHT): 3.72 cm    TR Peak grad:   26.0 mmHg MV Decel Time: 204 msec    TR Vmax:        255.00 cm/s MV E velocity: 73.70 cm/s MV A velocity: 77.10 cm/s  SHUNTS MV E/A ratio:  0.96        Systemic VTI:  0.19 m                            Systemic Diam: 2.10 cm Candee Furbish MD Electronically signed by Candee Furbish MD Signature Date/Time: 03/22/2022/10:29:35 AM    Final    DG Chest 2 View  Result Date: 03/21/2022 CLINICAL DATA:  Chest pain EXAM: CHEST - 2 VIEW COMPARISON:  02/07/2022 FINDINGS: Artifact from EKG leads. Normal heart size and mediastinal contours. No acute infiltrate or edema. No effusion or pneumothorax. No acute osseous findings. IMPRESSION: No evidence of active disease. Electronically Signed   By: Jorje Guild M.D.   On: 03/21/2022 11:33     Scheduled Meds:  aspirin EC  81 mg Oral Daily   atorvastatin  40 mg Oral q1800   carvedilol  6.25 mg Oral BID WC   clopidogrel  75 mg Oral Daily   enalapril  20 mg Oral Daily   fluticasone  1 spray Each Nare Daily   insulin aspart  0-9 Units Subcutaneous TID WC   insulin glargine-yfgn  10 Units Subcutaneous QHS   polyvinyl alcohol  2 drop Both Eyes TID   sodium chloride flush  3 mL Intravenous Q12H   Continuous Infusions:  sodium chloride     heparin 1,000 Units/hr (03/21/22 1548)     LOS: 1 day    Roxan Hockey M.D on 03/22/2022 at 1:30 PM  Go to [email or web address] - for contact info  [facility] - Office   [phone]  If 7PM-7AM, please contact  night-coverage [email or web address] 03/22/2022, 1:30 PM

## 2022-03-22 NOTE — Progress Notes (Signed)
[facility] for heparin Indication: chest pain/ACS Brief A/P: Heparin level within goal range Continue Heparin at current rate   Patient Measurements: Height: '5\' 6"'$  (167.6 cm) Weight: 83 kg (183 lb) IBW/kg (Calculated) : 59.3 Heparin Dosing Weight: 77 kg  Labs: HL 0.32 <-- 0.37  Estimated Creatinine Clearance: 45.3 mL/min (by C-G formula based on SCr of 0.95 mg/dL).  Assessment: 88 y.o. female with hx PAF, htn, DM, CKD consulted for heparin for CP. Heparin level remains therapeutic this morning.   Goal of Therapy:  Heparin level 0.3-0.7 units/ml Monitor platelets by anticoagulation protocol: Yes   Plan:  Continue Heparin at current rate   Lorenso Courier, PharmD Clinical Pharmacist 03/22/2022 9:04 AM

## 2022-03-22 NOTE — Progress Notes (Signed)
Echocardiogram 2D Echocardiogram has been performed.  Oneal Deputy Saron Vanorman RDCS 03/22/2022, 9:25 AM

## 2022-03-22 NOTE — Progress Notes (Signed)
Rounding Note    Patient Name: Audrey Walker Date of Encounter: 03/22/2022  [place] Cardiologist: Rozann Lesches, MD   Subjective   Comfortable laying in bed with mask.  No active anginal symptoms.  Inpatient Medications    Scheduled Meds:  aspirin EC  81 mg Oral Daily   atorvastatin  40 mg Oral q1800   carvedilol  6.25 mg Oral BID WC   clopidogrel  75 mg Oral Daily   enalapril  20 mg Oral Daily   fluticasone  1 spray Each Nare Daily   insulin aspart  0-9 Units Subcutaneous TID WC   insulin glargine-yfgn  10 Units Subcutaneous QHS   polyvinyl alcohol  2 drop Both Eyes TID   sodium chloride flush  3 mL Intravenous Q12H   Continuous Infusions:  sodium chloride     heparin 1,000 Units/hr (03/21/22 1548)   PRN Meds: sodium chloride, acetaminophen, albuterol, hydrALAZINE, nitroGLYCERIN, ondansetron (ZOFRAN) IV, sodium chloride flush   Vital Signs    Vitals:   03/21/22 2343 03/22/22 0700 03/22/22 0730 03/22/22 0830  BP: 130/89 (!) 107/49 (!) 123/56 (!) 146/61  Pulse: 68 61 62 65  Resp: '18 19 14 10  '$ Temp: 97.8 F (36.6 C)     TempSrc: Oral     SpO2: 95% 94% 100% 98%  Weight:      Height:        Intake/Output Summary (Last 24 hours) at 03/22/2022 1019 Last data filed at 03/22/2022 0739 Gross per 24 hour  Intake --  Output 1350 ml  Net -1350 ml      03/21/2022   11:06 AM 11/22/2021    1:21 PM 12/10/2020    9:04 AM  Last 3 Weights  Weight (lbs) 183 lb 182 lb 177 lb  Weight (kg) 83.008 kg 82.555 kg 80.287 kg      Telemetry    No adverse arrhythmias, sinus rhythm- Personally Reviewed  ECG    Sinus rhythm with no ischemic changes- Personally Reviewed  Physical Exam   GEN: No acute distress.  Elderly Neck: No JVD Cardiac: RRR, no murmurs, rubs, or gallops.  Respiratory: Clear to auscultation bilaterally. GI: Soft, nontender, non-distended  MS: No edema; No deformity. Neuro:  Nonfocal  Psych: Normal affect   Labs    High  Sensitivity Troponin:   Recent Labs  Lab 03/21/22 1144 03/21/22 1331 03/22/22 0508  TROPONINIHS 216* 218* 215*     Chemistry Recent Labs  Lab 03/21/22 1144 03/22/22 0508  NA 143 144  K 4.2 4.0  CL 107 108  CO2 29 29  GLUCOSE 103* 86  BUN 30* 25*  CREATININE 1.16* 0.95  CALCIUM 9.5 9.3  PROT 6.3*  --   ALBUMIN 3.8  --   AST 40  --   ALT 35  --   ALKPHOS 62  --   BILITOT 1.0  --   GFRNONAA 46* 58*  ANIONGAP 7 7    Lipids  Recent Labs  Lab 03/22/22 0508  CHOL 171  TRIG 48  HDL 67  LDLCALC 94  CHOLHDL 2.6    Hematology Recent Labs  Lab 03/21/22 1144 03/22/22 0508  WBC 7.0 7.2  RBC 3.73* 3.80*  HGB 11.6* 11.8*  HCT 35.6* 36.4  MCV 95.4 95.8  MCH 31.1 31.1  MCHC 32.6 32.4  RDW 12.1 12.0  PLT 147* 144*   Thyroid No results for input(s): "TSH", "FREET4" in the last 168 hours.  BNPNo results for input(s): "BNP", "PROBNP" in  the last 168 hours.  DDimer No results for input(s): "DDIMER" in the last 168 hours.   Radiology    DG Chest 2 View  Result Date: 03/21/2022 CLINICAL DATA:  Chest pain EXAM: CHEST - 2 VIEW COMPARISON:  02/07/2022 FINDINGS: Artifact from EKG leads. Normal heart size and mediastinal contours. No acute infiltrate or edema. No effusion or pneumothorax. No acute osseous findings. IMPRESSION: No evidence of active disease. Electronically Signed   By: Jorje Guild M.D.   On: 03/21/2022 11:33    Cardiac Studies   Echo preliminarily shows normal ejection fraction.  No wall motion abnormalities.  Patient Profile     88 y.o. female with non-ST elevation myocardial infarction, low-level troponin in the 200 range, normal ejection fraction on echocardiogram with no new wall motion abnormalities, currently chest pain-free  Assessment & Plan    Non-STEMI - After lengthy discussion with she and her daughter, we have opted for continued medical management.  Noninvasive approach.  Thankfully echocardiogram does not demonstrate any wall motion  abnormalities and shows normal ejection fraction.  She is currently chest pain-free.  Her troponin was only 200 and relatively flat.  We discussed all of these factors and the pros and cons of further invasive management strategy with cardiac catheterization.  I think that since she is not having any anginal symptoms, we will hold off on catheterization at this time.  Reviewed the risks and benefits including stroke heart attack death further renal impairment.  She is relieved that we do not need to go down this path at this point.  Obviously if anginal symptoms become more apparent with physical activity, we can always consider if antianginals are not successful at controlling her discomfort.  We also went over her DNR status once again and she reiterated that she did would not want to have shocks CPR or ventilator.  Her daughter was present for discussion. -We will proceed with goal-directed medical therapy which will include aspirin 81 mg, Plavix 75 mg, carvedilol, statin.  She had been on atenolol for many years for her hypertension.  We are switching that over to carvedilol. -Her daughter did state that she may have had issues in the past with aspirin.  She tolerated her aspirin yesterday without any difficulty.  No allergic signs or symptoms.  I would like for her to be on low-dose aspirin ongoing at this time.  Take with food.  Paroxysmal atrial flutter - Maintaining sinus rhythm.  CHA2DS2-VASc 5.  Does not desire anticoagulation.  This has been discussed in the past.  Hypertension - Has been on at home atenolol 50 mg a day as well as Vasotec 20 mg a day, ACE inhibitor.  Okay to continue enalapril.  CRITICAL CARE Performed by: Candee Furbish   Total critical care time: 35 minutes  Critical care time was exclusive of separately billable procedures and treating other patients.  Critical care was necessary to treat or prevent imminent or life-threatening deterioration.  Critical care was time  spent personally by me on the following activities: development of treatment plan with patient and/or surrogate as well as nursing, discussions with consultants, evaluation of patient's response to treatment, examination of patient, obtaining history from patient or surrogate, ordering and performing treatments and interventions, ordering and review of laboratory studies, ordering and review of radiographic studies, pulse oximetry and re-evaluation of patient's condition.     For questions or updates, please contact [place] Please consult [email or web address] for contact info under  Signed, Candee Furbish, MD  03/22/2022, 10:19 AM

## 2022-03-22 NOTE — Inpatient Diabetes Management (Signed)
Inpatient Diabetes Program Recommendations  AACE/ADA: New Consensus Statement on Inpatient Glycemic Control   Target Ranges:  Prepandial:   less than 140 mg/dL      Peak postprandial:   less than 180 mg/dL (1-2 hours)      Critically ill patients:  140 - 180 mg/dL    Latest Reference Range & Units 03/22/22 07:45 03/22/22 12:23  Glucose-Capillary 70 - 99 mg/dL 76 70    Latest Reference Range & Units 03/21/22 11:08 03/21/22 17:40 03/21/22 19:39  Glucose-Capillary 70 - 99 mg/dL 106 (H) 64 (L) 189 (H)    Latest Reference Range & Units 03/22/22 05:08  Hemoglobin A1C 4.8 - 5.6 % 6.3 (H)   Review of Glycemic Control  Diabetes history: DM2 Outpatient Diabetes medications: Tresiba 18 units QHS Current orders for Inpatient glycemic control: Semglee 10 units QHS, Novolog 0-9 units TID with meals  Inpatient Diabetes Program Recommendations:    Insulin: Patient received Semglee 10 units last night and CBGs today 76 and 70 mg/dl. Please consider decreasing Semglee to 7 units QHS.  Outpatient: Per H&P, patient experienced hypoglycemia at home prior to presenting to the hospital. May need to consider decreasing outpatient dose of Tresiba to prevent hypoglycemia as outpatient.  Thanks, Barnie Alderman, RN, MSN, [place] Diabetes Coordinator Inpatient Diabetes Program [phone] (Team Pager from 8am to [place])

## 2022-03-22 NOTE — TOC Progression Note (Signed)
  Transition of Care [facility]) Screening Note   Patient Details  Name: Audrey Walker Date of Birth: 1932/11/13   Transition of Care [facility]) CM/SW Contact:    Shade Flood, LCSW Phone Number: 03/22/2022, 10:28 AM    Pt awaiting transfer to [facility].  Transition of Care Department [facility]) has reviewed patient and no TOC needs have been identified at this time. We will continue to monitor patient advancement through interdisciplinary progression rounds. If new patient transition needs arise, please place a TOC consult.

## 2022-03-23 DIAGNOSIS — I4891 Unspecified atrial fibrillation: Secondary | ICD-10-CM | POA: Diagnosis not present

## 2022-03-23 DIAGNOSIS — I214 Non-ST elevation (NSTEMI) myocardial infarction: Secondary | ICD-10-CM | POA: Diagnosis not present

## 2022-03-23 LAB — HEPARIN LEVEL (UNFRACTIONATED)
Heparin Unfractionated: 0.14 IU/mL — ABNORMAL LOW (ref 0.30–0.70)
Heparin Unfractionated: 0.41 IU/mL (ref 0.30–0.70)

## 2022-03-23 LAB — GLUCOSE, CAPILLARY
Glucose-Capillary: 154 mg/dL — ABNORMAL HIGH (ref 70–99)
Glucose-Capillary: 206 mg/dL — ABNORMAL HIGH (ref 70–99)
Glucose-Capillary: 163 mg/dL — ABNORMAL HIGH (ref 70–99)
Glucose-Capillary: 143 mg/dL — ABNORMAL HIGH (ref 70–99)

## 2022-03-23 LAB — CBC
HCT: 36.1 % (ref 36.0–46.0)
Hemoglobin: 11.8 g/dL — ABNORMAL LOW (ref 12.0–15.0)
MCH: 31.1 pg (ref 26.0–34.0)
MCHC: 32.7 g/dL (ref 30.0–36.0)
MCV: 95 fL (ref 80.0–100.0)
Platelets: 176 10*3/uL (ref 150–400)
RBC: 3.8 MIL/uL — ABNORMAL LOW (ref 3.87–5.11)
RDW: 12 % (ref 11.5–15.5)
WBC: 8.6 10*3/uL (ref 4.0–10.5)
nRBC: 0 % (ref 0.0–0.2)

## 2022-03-23 LAB — LIPOPROTEIN A (LPA): Lipoprotein (a): 91.1 nmol/L — ABNORMAL HIGH (ref <75.0)

## 2022-03-23 MED ORDER — METOPROLOL TARTRATE 25 MG PO TABS
25.0000 mg | ORAL_TABLET | Freq: Two times a day (BID) | ORAL | Status: DC
  Administered 2022-03-23 – 2022-03-25 (×5): 25 mg via ORAL
  Filled 2022-03-23 (×5): qty 1

## 2022-03-23 NOTE — Progress Notes (Signed)
[place] for heparin Indication: chest pain/ACS  Allergies  Allergen Reactions   Nsaids Other (See Comments)    Per nephrologist   Patient Measurements: Height: '5\' 6"'$  (167.6 cm) Weight: 84.5 kg (186 lb 4.6 oz) IBW/kg (Calculated) : 59.3 Heparin Dosing Weight: 77 kg  Labs: Recent Labs    03/21/22 1144 03/21/22 1331 03/21/22 2330 03/22/22 0508 03/23/22 0351 03/23/22 1554  HGB 11.6*  --   --  11.8*  --  11.8*  HCT 35.6*  --   --  36.4  --  36.1  PLT 147*  --   --  144*  --  176  HEPARINUNFRC  --   --    < > 0.32 0.14* 0.41  CREATININE 1.16*  --   --  0.95  --   --   TROPONINIHS 216* 218*  --  215*  --   --    < > = values in this interval not displayed.    Estimated Creatinine Clearance: 45.7 mL/min (by C-G formula based on SCr of 0.95 mg/dL).  Assessment: 88 y.o. female with hx PAF, htn, DM, CKD consulted for heparin for CP x 48 hours.   HL 0.41- therapeutic  Goal of Therapy:  Heparin level 0.3-0.7 units/ml Monitor platelets by anticoagulation protocol: Yes   Plan:  Continue heparin infusion at 1150 units/hr Heparin level  and CBC daily  Revonda Standard Nikolaj Geraghty 03/23/2022,5:17 PM

## 2022-03-23 NOTE — Progress Notes (Signed)
Rounding Note    Patient Name: Audrey Walker Date of Encounter: 03/23/2022  [place] Cardiologist: Rozann Lesches, MD   Subjective   Awake, NAD - lots of questions about her meds. Concerned about aspirin because she was told to avoid NSAIDS as a child d/t an incident of gum swelling when taking aspirin. No swelling noted here.  Inpatient Medications    Scheduled Meds:  aspirin EC  81 mg Oral Daily   atorvastatin  40 mg Oral q1800   clopidogrel  75 mg Oral Daily   enalapril  20 mg Oral Daily   fluticasone  1 spray Each Nare Daily   insulin aspart  0-9 Units Subcutaneous TID WC   insulin glargine-yfgn  8 Units Subcutaneous QHS   isosorbide mononitrate  30 mg Oral Daily   sodium chloride flush  3 mL Intravenous Q12H   Continuous Infusions:  sodium chloride     heparin 1,150 Units/hr (03/23/22 0824)   PRN Meds: sodium chloride, acetaminophen, albuterol, hydrALAZINE, metoprolol tartrate, nitroGLYCERIN, ondansetron (ZOFRAN) IV, mouth rinse, sodium chloride flush   Vital Signs    Vitals:   03/22/22 2217 03/22/22 2329 03/23/22 0441 03/23/22 0900  BP: (!) 94/46 (!) 104/55 110/66 131/68  Pulse:  (!) 109 (!) 104 (!) 101  Resp:  '16 16 16  '$ Temp:  97.8 F (36.6 C) 97.8 F (36.6 C) 97.8 F (36.6 C)  TempSrc:    Oral  SpO2:  96% 95% 97%  Weight:      Height:        Intake/Output Summary (Last 24 hours) at 03/23/2022 1015 Last data filed at 03/23/2022 0939 Gross per 24 hour  Intake 991.16 ml  Output 700 ml  Net 291.16 ml      03/22/2022    1:46 PM 03/21/2022   11:06 AM 11/22/2021    1:21 PM  Last 3 Weights  Weight (lbs) 186 lb 4.6 oz 183 lb 182 lb  Weight (kg) 84.5 kg 83.008 kg 82.555 kg      Telemetry    New onset afib with RVR -  Personally Reviewed  ECG    N/A  Physical Exam   GEN: No acute distress.  Elderly Neck: No JVD Cardiac: irregularly irregular, tachycardic, no murmurs, rubs, or gallops.  Respiratory: Clear to auscultation  bilaterally. GI: Soft, nontender, non-distended  MS: No edema; No deformity. Neuro:  Nonfocal  Psych: Normal affect   Labs    High Sensitivity Troponin:   Recent Labs  Lab 03/21/22 1144 03/21/22 1331 03/22/22 0508  TROPONINIHS 216* 218* 215*     Chemistry Recent Labs  Lab 03/21/22 1144 03/22/22 0508  NA 143 144  K 4.2 4.0  CL 107 108  CO2 29 29  GLUCOSE 103* 86  BUN 30* 25*  CREATININE 1.16* 0.95  CALCIUM 9.5 9.3  PROT 6.3*  --   ALBUMIN 3.8  --   AST 40  --   ALT 35  --   ALKPHOS 62  --   BILITOT 1.0  --   GFRNONAA 46* 58*  ANIONGAP 7 7    Lipids  Recent Labs  Lab 03/22/22 0508  CHOL 171  TRIG 48  HDL 67  LDLCALC 94  CHOLHDL 2.6    Hematology Recent Labs  Lab 03/21/22 1144 03/22/22 0508  WBC 7.0 7.2  RBC 3.73* 3.80*  HGB 11.6* 11.8*  HCT 35.6* 36.4  MCV 95.4 95.8  MCH 31.1 31.1  MCHC 32.6 32.4  RDW 12.1 12.0  PLT 147* 144*   Thyroid  Recent Labs  Lab 03/22/22 1435  TSH 0.904    BNPNo results for input(s): "BNP", "PROBNP" in the last 168 hours.  DDimer No results for input(s): "DDIMER" in the last 168 hours.   Radiology    ECHOCARDIOGRAM COMPLETE  Result Date: 03/22/2022    ECHOCARDIOGRAM REPORT   Patient Name:   Audrey Walker Date of Exam: 03/22/2022 Medical Rec #:  [id number]     Height:       66.0 in Accession #:    [id number]    Weight:       183.0 lb Date of Birth:  Feb 05, 1933     BSA:          1.926 m Patient Age:    88 years      BP:           146/61 mmHg Patient Gender: F             HR:           69 bpm. Exam Location:  [facility] Procedure: 2D Echo, Color Doppler and Cardiac Doppler Indications:    R07.9* Chest pain, unspecified  History:        Patient has prior history of Echocardiogram examinations, most                 recent 08/23/2012. Arrythmias:Atrial Flutter; Risk                 Factors:Hypertension and Diabetes.  Sonographer:    Raquel Sarna Senior RDCS Referring Phys: [place]  1. Left ventricular  ejection fraction, by estimation, is 60 to 65%. The left ventricle has normal function. The left ventricle has no regional wall motion abnormalities. Left ventricular diastolic parameters are consistent with Grade I diastolic dysfunction (impaired relaxation).  2. Right ventricular systolic function is normal. The right ventricular size is normal. There is normal pulmonary artery systolic pressure. The estimated right ventricular systolic pressure is 02.7 mmHg.  3. Left atrial size was mildly dilated.  4. The mitral valve is normal in structure. Trivial mitral valve regurgitation. No evidence of mitral stenosis.  5. Tricuspid valve regurgitation is moderate.  6. The aortic valve is tricuspid. Aortic valve regurgitation is not visualized. No aortic stenosis is present.  7. Pulmonic valve regurgitation is moderate.  8. The inferior vena cava is normal in size with greater than 50% respiratory variability, suggesting right atrial pressure of 3 mmHg. FINDINGS  Left Ventricle: Left ventricular ejection fraction, by estimation, is 60 to 65%. The left ventricle has normal function. The left ventricle has no regional wall motion abnormalities. The left ventricular internal cavity size was normal in size. There is  no left ventricular hypertrophy. Left ventricular diastolic parameters are consistent with Grade I diastolic dysfunction (impaired relaxation). Right Ventricle: The right ventricular size is normal. No increase in right ventricular wall thickness. Right ventricular systolic function is normal. There is normal pulmonary artery systolic pressure. The tricuspid regurgitant velocity is 2.55 m/s, and  with an assumed right atrial pressure of 3 mmHg, the estimated right ventricular systolic pressure is 74.1 mmHg. Left Atrium: Left atrial size was mildly dilated. Right Atrium: Right atrial size was normal in size. Pericardium: There is no evidence of pericardial effusion. Presence of epicardial fat layer. Mitral Valve:  The mitral valve is normal in structure. Trivial mitral valve regurgitation. No evidence of mitral valve stenosis. Tricuspid Valve: The tricuspid valve is normal in structure. Tricuspid valve  regurgitation is moderate . No evidence of tricuspid stenosis. Aortic Valve: The aortic valve is tricuspid. Aortic valve regurgitation is not visualized. No aortic stenosis is present. Pulmonic Valve: The pulmonic valve was normal in structure. Pulmonic valve regurgitation is moderate. No evidence of pulmonic stenosis. Aorta: The aortic root is normal in size and structure. Venous: The inferior vena cava is normal in size with greater than 50% respiratory variability, suggesting right atrial pressure of 3 mmHg. IAS/Shunts: No atrial level shunt detected by color flow Doppler.  LEFT VENTRICLE PLAX 2D LVIDd:         3.70 cm     Diastology LVIDs:         2.60 cm     LV e' medial:    4.68 cm/s LV PW:         0.90 cm     LV E/e' medial:  15.7 LV IVS:        0.80 cm     LV e' lateral:   7.24 cm/s LVOT diam:     2.10 cm     LV E/e' lateral: 10.2 LV SV:         65 LV SV Index:   34 LVOT Area:     3.46 cm  LV Volumes (MOD) LV vol d, MOD A2C: 75.5 ml LV vol d, MOD A4C: 72.7 ml LV vol s, MOD A2C: 31.0 ml LV vol s, MOD A4C: 35.4 ml LV SV MOD A2C:     44.5 ml LV SV MOD A4C:     72.7 ml LV SV MOD BP:      43.0 ml RIGHT VENTRICLE RV S prime:     15.00 cm/s TAPSE (M-mode): 2.3 cm LEFT ATRIUM             Index        RIGHT ATRIUM           Index LA diam:        3.60 cm 1.87 cm/m   RA Area:     20.10 cm LA Vol (A2C):   69.0 ml 35.83 ml/m  RA Volume:   56.40 ml  29.29 ml/m LA Vol (A4C):   54.1 ml 28.09 ml/m LA Biplane Vol: 63.8 ml 33.13 ml/m  AORTIC VALVE LVOT Vmax:   67.20 cm/s LVOT Vmean:  57.100 cm/s LVOT VTI:    0.188 m  AORTA Ao Root diam: 2.90 cm Ao Asc diam:  3.30 cm MITRAL VALVE               TRICUSPID VALVE MV Area (PHT): 3.72 cm    TR Peak grad:   26.0 mmHg MV Decel Time: 204 msec    TR Vmax:        255.00 cm/s MV E velocity:  73.70 cm/s MV A velocity: 77.10 cm/s  SHUNTS MV E/A ratio:  0.96        Systemic VTI:  0.19 m                            Systemic Diam: 2.10 cm Candee Furbish MD Electronically signed by Candee Furbish MD Signature Date/Time: 03/22/2022/10:29:35 AM    Final    DG Chest 2 View  Result Date: 03/21/2022 CLINICAL DATA:  Chest pain EXAM: CHEST - 2 VIEW COMPARISON:  02/07/2022 FINDINGS: Artifact from EKG leads. Normal heart size and mediastinal contours. No acute infiltrate or edema. No effusion or pneumothorax. No acute osseous findings. IMPRESSION:  No evidence of active disease. Electronically Signed   By: Jorje Guild M.D.   On: 03/21/2022 11:33    Cardiac Studies   Echo preliminarily shows normal ejection fraction.  No wall motion abnormalities.  Patient Profile     88 y.o. female with non-ST elevation myocardial infarction, low-level troponin in the 200 range, normal ejection fraction on echocardiogram with no new wall motion abnormalities, currently chest pain-free  Assessment & Plan    Non-STEMI - After lengthy discussion with she and her daughter, we have opted for continued medical management.  Noninvasive approach.  Thankfully echocardiogram does not demonstrate any wall motion abnormalities and shows normal ejection fraction.  She is currently chest pain-free.  Her troponin was only 200 and relatively flat.  We discussed all of these factors and the pros and cons of further invasive management strategy with cardiac catheterization.  I think that since she is not having any anginal symptoms, we will hold off on catheterization at this time.  Reviewed the risks and benefits including stroke heart attack death further renal impairment.  She is relieved that we do not need to go down this path at this point.  Obviously if anginal symptoms become more apparent with physical activity, we can always consider if antianginals are not successful at controlling her discomfort.  We also went over her DNR  status once again and she reiterated that she did would not want to have shocks CPR or ventilator.  Her daughter was present for discussion. -We will proceed with goal-directed medical therapy which will include aspirin 81 mg, Plavix 75 mg, carvedilol, statin.  She had been on atenolol for many years for her hypertension.  We are switching that over to carvedilol. -Her daughter did state that she may have had issues in the past with aspirin.  She tolerated her aspirin yesterday without any difficulty.  No allergic signs or symptoms.  I would like for her to be on low-dose aspirin ongoing at this time.  Take with food.  Paroxysmal atrial flutter - Went into afib with RVR overnight-  CHA2DS2-VASc 5.  Does not desire long-term anticoagulation, but on IV heparin now. If she does not spontaneously convert, may need to pursue cardioversion, for which she will need to be anticoagulated. Was on atenolol at home, but this was stopped. Will start metoprolol tartrate 25 mg BID. Recommend transition to NOAC at discharge.  Hypertension - Has been on at home atenolol 50 mg a day as well as Vasotec 20 mg a day, ACE inhibitor.  Okay to continue enalapril. Plan to start metoprolol for rate-control afib.   For questions or updates, please contact [place] Please consult [email or web address] for contact info under   Pixie Casino, MD, FACC, [place] Director of the [facility] Diplomate of the American Board of Clinical Lipidology Attending Cardiologist  Direct Dial: [phone]  Fax: [phone]  Website:  [email or web address]   Pixie Casino, MD  03/23/2022, 10:15 AM

## 2022-03-23 NOTE — Progress Notes (Signed)
Charge RN verified Heparin infusion rate and IV site

## 2022-03-23 NOTE — Progress Notes (Signed)
PROGRESS NOTE     Audrey Walker, is a 88 y.o. female, DOB - 02/03/1933, YTK:[id number]  Admit date - 03/21/2022   Admitting Physician Albeiro Trompeter Denton Brick, MD  Outpatient Primary MD for the patient is Celene Squibb, MD  LOS - 2  Chief Complaint  Patient presents with   Weakness   Chest Pain        Brief Narrative:  88 y.o. female with medical history significant of HTN, DM, stage 2 CKD, and afib (refused AC) admitted on 03/21/2022 with NSTEMI and chest pains   -Assessment and Plan: 1)NSTEMI--currently chest pain-free Troponin 216 >>>218 >>215 -EKG without acute ST changes -LDL 94, total cholesterol 171 with HDL of 67 -Echo from 03/22/2022 with EF of 60 to 65%, no regional wall motion normalities patient does have grade 1 diastolic dysfunction.  No LVH, no pulmonary hypertension -Left atrium is mildly dilated , -No aortic stenosis or mitral stenosis -Cardiology team spoke with pt and daughter and they agreed to Med Rx w/o LHC -IV heparin x48 hours at least -Aspirin, Plavix, imdur, Lipitor and Coreg as ordered -Patient will need to be ambulated prior to discharge to make sure she does not have anginal symptoms -If persistent anginal symptoms patient will be willing to undergo LHC at that time 03/23/22 -Remains chest pain-free, no bleeding concerns with IV heparin -Continue metoprolol  2)PAFib-- CHA2DS2- VASc score   is = 7 (age x 2, DM2, HTN, Female, dCHF, MI)  Which is  equal to = 11.2 % annual risk of stroke  This patients CHA2DS2-VASc Score and unadjusted Ischemic Stroke Rate (% per year) is equal to 11.2 % stroke rate/year from a score of 7 -Patient declines full anticoagulation for stroke prophylaxis -Metoprolol for rate control  TSH 0.9 -Echo as above with preserved EF and only mild atrial enlargement, and no significant valvular abnormalities  3)DM2--- A1c 6.3.Marland KitchenMarland Kitchen Reflecting excellent diabetic control PTA -Decrease Lantus to 8 units nightly Use Novolog/Humalog Sliding  scale insulin with Accu-Cheks/Fingersticks as ordered   4) CKD stage - 3A   creatinine on admission= 1.16, -Which is close to patient's baseline -Creatinine currently trending down -renally adjust medications, avoid nephrotoxic agents / dehydration  / hypotension  5)Social/Ethics--- discussed with patient and patient's daughter [name] -Patient remains DNR/DNI without  limitations to treatment otherwise  6)HTN--stopped atenolol -Metoprolol and isosorbide as ordered -IV hydralazine as needed  7) mild chronic anemia--- no bleeding concerns, may be related to CKD -Watch closely with initiation of IV heparin and DAPT  Disposition/Need for in-Hospital Stay- patient unable to be discharged at this time due to --NSTEMI requiring continuous IV heparin drip per ACS protocol -Possible discharge in 24 to 48 hours if remains chest pain-free especially with activity after completing IV heparin per ACS protocol  Status is: Inpatient   Disposition: The patient is from: Home              Anticipated d/c is to: Home              Anticipated d/c date is: 2 days              Patient currently is not medically stable to d/c. Barriers: Not Clinically Stable-   Code Status :  -  Code Status: DNR   Family Communication:    Discussed with daughter [name] and granddaughter at bedside  DVT Prophylaxis  :   - SCDs /Iv Heparin     Lab Results  Component Value Date  PLT 176 03/23/2022   Inpatient Medications  Scheduled Meds:  aspirin EC  81 mg Oral Daily   atorvastatin  40 mg Oral q1800   clopidogrel  75 mg Oral Daily   enalapril  20 mg Oral Daily   fluticasone  1 spray Each Nare Daily   insulin aspart  0-9 Units Subcutaneous TID WC   insulin glargine-yfgn  8 Units Subcutaneous QHS   isosorbide mononitrate  30 mg Oral Daily   metoprolol tartrate  25 mg Oral BID   sodium chloride flush  3 mL Intravenous Q12H   Continuous Infusions:  sodium chloride     heparin 1,150  Units/hr (03/23/22 1643)   PRN Meds:.sodium chloride, acetaminophen, albuterol, hydrALAZINE, metoprolol tartrate, nitroGLYCERIN, ondansetron (ZOFRAN) IV, mouth rinse, sodium chloride flush   Anti-infectives (From admission, onward)    None       Subjective: Kathlyn [place] today has no fevers, no emesis,      Patient's daughter [name] and granddaughter at bedside  No dyspnea -Oxygen off and no hypoxia -Chest pain-free but has not been doing much in the room  Objective: Vitals:   03/23/22 0441 03/23/22 0900 03/23/22 1034 03/23/22 1612  BP: 110/66 131/68 117/70 121/62  Pulse: (!) 104 (!) 101 93 93  Resp: '16 16 16 18  '$ Temp: 97.8 F (36.6 C) 97.8 F (36.6 C) 98 F (36.7 C) 98 F (36.7 C)  TempSrc:  Oral Oral Oral  SpO2: 95% 97% 98% 100%  Weight:      Height:        Intake/Output Summary (Last 24 hours) at 03/23/2022 1916 Last data filed at 03/23/2022 1536 Gross per 24 hour  Intake 480 ml  Output --  Net 480 ml   Filed Weights   03/21/22 1106 03/22/22 1346  Weight: 83 kg 84.5 kg    Physical Exam  Gen:- Awake Alert,  in no apparent distress, no conversational dyspnea HEENT:- [place].AT, No sclera icterus Neck-Supple Neck,No JVD,.  Lungs-  CTAB , fair symmetrical air movement CV- S1, S2 normal, regular  Abd-  +ve B.Sounds, Abd Soft, No tenderness,    Extremity/Skin:- No  edema, pedal pulses present  Psych-affect is appropriate, oriented x3 Neuro-no new focal deficits, no tremors  Data Reviewed: I have personally reviewed following labs and imaging studies  CBC: Recent Labs  Lab 03/21/22 1144 03/22/22 0508 03/23/22 1554  WBC 7.0 7.2 8.6  NEUTROABS 4.9  --   --   HGB 11.6* 11.8* 11.8*  HCT 35.6* 36.4 36.1  MCV 95.4 95.8 95.0  PLT 147* 144* 811   Basic Metabolic Panel: Recent Labs  Lab 03/21/22 1144 03/22/22 0508  NA 143 144  K 4.2 4.0  CL 107 108  CO2 29 29  GLUCOSE 103* 86  BUN 30* 25*  CREATININE 1.16* 0.95  CALCIUM 9.5 9.3    GFR: Estimated Creatinine Clearance: 45.7 mL/min (by C-G formula based on SCr of 0.95 mg/dL). Liver Function Tests: Recent Labs  Lab 03/21/22 1144  AST 40  ALT 35  ALKPHOS 62  BILITOT 1.0  PROT 6.3*  ALBUMIN 3.8   HbA1C: Recent Labs    03/22/22 0508  HGBA1C 6.3*   Radiology Studies: ECHOCARDIOGRAM COMPLETE  Result Date: 03/22/2022    ECHOCARDIOGRAM REPORT   Patient Name:   Dariana Amason Date of Exam: 03/22/2022 Medical Rec #:  [id number]     Height:       66.0 in Accession #:    [id number]  Weight:       183.0 lb Date of Birth:  [birth year]     BSA:          1.926 m Patient Age:    16 years      BP:           146/61 mmHg Patient Gender: F             HR:           69 bpm. Exam Location:  [facility] Procedure: 2D Echo, Color Doppler and Cardiac Doppler Indications:    R07.9* Chest pain, unspecified  History:        Patient has prior history of Echocardiogram examinations, most                 recent 08/23/2012. Arrythmias:Atrial Flutter; Risk                 Factors:Hypertension and Diabetes.  Sonographer:    Raquel Sarna Senior RDCS Referring Phys: Gu-Win  1. Left ventricular ejection fraction, by estimation, is 60 to 65%. The left ventricle has normal function. The left ventricle has no regional wall motion abnormalities. Left ventricular diastolic parameters are consistent with Grade I diastolic dysfunction (impaired relaxation).  2. Right ventricular systolic function is normal. The right ventricular size is normal. There is normal pulmonary artery systolic pressure. The estimated right ventricular systolic pressure is 53.9 mmHg.  3. Left atrial size was mildly dilated.  4. The mitral valve is normal in structure. Trivial mitral valve regurgitation. No evidence of mitral stenosis.  5. Tricuspid valve regurgitation is moderate.  6. The aortic valve is tricuspid. Aortic valve regurgitation is not visualized. No aortic stenosis is present.  7. Pulmonic valve regurgitation  is moderate.  8. The inferior vena cava is normal in size with greater than 50% respiratory variability, suggesting right atrial pressure of 3 mmHg. FINDINGS  Left Ventricle: Left ventricular ejection fraction, by estimation, is 60 to 65%. The left ventricle has normal function. The left ventricle has no regional wall motion abnormalities. The left ventricular internal cavity size was normal in size. There is  no left ventricular hypertrophy. Left ventricular diastolic parameters are consistent with Grade I diastolic dysfunction (impaired relaxation). Right Ventricle: The right ventricular size is normal. No increase in right ventricular wall thickness. Right ventricular systolic function is normal. There is normal pulmonary artery systolic pressure. The tricuspid regurgitant velocity is 2.55 m/s, and  with an assumed right atrial pressure of 3 mmHg, the estimated right ventricular systolic pressure is 76.7 mmHg. Left Atrium: Left atrial size was mildly dilated. Right Atrium: Right atrial size was normal in size. Pericardium: There is no evidence of pericardial effusion. Presence of epicardial fat layer. Mitral Valve: The mitral valve is normal in structure. Trivial mitral valve regurgitation. No evidence of mitral valve stenosis. Tricuspid Valve: The tricuspid valve is normal in structure. Tricuspid valve regurgitation is moderate . No evidence of tricuspid stenosis. Aortic Valve: The aortic valve is tricuspid. Aortic valve regurgitation is not visualized. No aortic stenosis is present. Pulmonic Valve: The pulmonic valve was normal in structure. Pulmonic valve regurgitation is moderate. No evidence of pulmonic stenosis. Aorta: The aortic root is normal in size and structure. Venous: The inferior vena cava is normal in size with greater than 50% respiratory variability, suggesting right atrial pressure of 3 mmHg. IAS/Shunts: No atrial level shunt detected by color flow Doppler.  LEFT VENTRICLE PLAX 2D LVIDd:  3.70 cm     Diastology LVIDs:         2.60 cm     LV e' medial:    4.68 cm/s LV PW:         0.90 cm     LV E/e' medial:  15.7 LV IVS:        0.80 cm     LV e' lateral:   7.24 cm/s LVOT diam:     2.10 cm     LV E/e' lateral: 10.2 LV SV:         65 LV SV Index:   34 LVOT Area:     3.46 cm  LV Volumes (MOD) LV vol d, MOD A2C: 75.5 ml LV vol d, MOD A4C: 72.7 ml LV vol s, MOD A2C: 31.0 ml LV vol s, MOD A4C: 35.4 ml LV SV MOD A2C:     44.5 ml LV SV MOD A4C:     72.7 ml LV SV MOD BP:      43.0 ml RIGHT VENTRICLE RV S prime:     15.00 cm/s TAPSE (M-mode): 2.3 cm LEFT ATRIUM             Index        RIGHT ATRIUM           Index LA diam:        3.60 cm 1.87 cm/m   RA Area:     20.10 cm LA Vol (A2C):   69.0 ml 35.83 ml/m  RA Volume:   56.40 ml  29.29 ml/m LA Vol (A4C):   54.1 ml 28.09 ml/m LA Biplane Vol: 63.8 ml 33.13 ml/m  AORTIC VALVE LVOT Vmax:   67.20 cm/s LVOT Vmean:  57.100 cm/s LVOT VTI:    0.188 m  AORTA Ao Root diam: 2.90 cm Ao Asc diam:  3.30 cm MITRAL VALVE               TRICUSPID VALVE MV Area (PHT): 3.72 cm    TR Peak grad:   26.0 mmHg MV Decel Time: 204 msec    TR Vmax:        255.00 cm/s MV E velocity: 73.70 cm/s MV A velocity: 77.10 cm/s  SHUNTS MV E/A ratio:  0.96        Systemic VTI:  0.19 m                            Systemic Diam: 2.10 cm Candee Furbish MD Electronically signed by Candee Furbish MD Signature Date/Time: 03/22/2022/10:29:35 AM    Final      Scheduled Meds:  aspirin EC  81 mg Oral Daily   atorvastatin  40 mg Oral q1800   clopidogrel  75 mg Oral Daily   enalapril  20 mg Oral Daily   fluticasone  1 spray Each Nare Daily   insulin aspart  0-9 Units Subcutaneous TID WC   insulin glargine-yfgn  8 Units Subcutaneous QHS   isosorbide mononitrate  30 mg Oral Daily   metoprolol tartrate  25 mg Oral BID   sodium chloride flush  3 mL Intravenous Q12H   Continuous Infusions:  sodium chloride     heparin 1,150 Units/hr (03/23/22 1643)     LOS: 2 days   Roxan Hockey M.D on  03/23/2022 at 7:17 PM  Go to [email or web address] - for contact info  [facility] - Office  [phone]  If 7PM-7AM, please contact night-coverage [email or web address] 03/23/2022,  7:17 PM

## 2022-03-23 NOTE — Progress Notes (Signed)
Pt turned yellow mews due to elevated pulse rate and low bp. MD and charge nurse made aware. New orders placed. Will continue to monitor pt and follow mews protocol.

## 2022-03-23 NOTE — Progress Notes (Signed)
[place] for heparin Indication: chest pain/ACS  Allergies  Allergen Reactions   Nsaids Other (See Comments)    Per nephrologist   Patient Measurements: Height: '5\' 6"'$  (167.6 cm) Weight: 84.5 kg (186 lb 4.6 oz) IBW/kg (Calculated) : 59.3 Heparin Dosing Weight: 77 kg  Labs: Recent Labs    03/21/22 1144 03/21/22 1331 03/21/22 2330 03/22/22 0508 03/23/22 0351  HGB 11.6*  --   --  11.8*  --   HCT 35.6*  --   --  36.4  --   PLT 147*  --   --  144*  --   HEPARINUNFRC  --   --  0.37 0.32 0.14*  CREATININE 1.16*  --   --  0.95  --   TROPONINIHS 216* 218*  --  215*  --     Estimated Creatinine Clearance: 45.7 mL/min (by C-G formula based on SCr of 0.95 mg/dL).  Assessment: 88 y.o. female with hx PAF, htn, DM, CKD consulted for heparin for CP x 48 hours. Heparin is subtherapeutic this morning - no adverse events reported.   Goal of Therapy:  Heparin level 0.3-0.7 units/ml Monitor platelets by anticoagulation protocol: Yes   Plan:  Increase heparin to 1150 units/hr Heparin level  and CBC in 8 hrs  Hadiyah Maricle Chika 03/23/2022,7:57 AM

## 2022-03-23 NOTE — Progress Notes (Signed)
Pt reports wetness under tegaderm at IV site, this RN assessed the IV site and secured the extension tubing to the IV catheter, new tegaderm applied, Heparin restarted at same rate on pump, medication infusing, pt tolerated well

## 2022-03-24 DIAGNOSIS — I214 Non-ST elevation (NSTEMI) myocardial infarction: Secondary | ICD-10-CM | POA: Diagnosis not present

## 2022-03-24 DIAGNOSIS — I480 Paroxysmal atrial fibrillation: Secondary | ICD-10-CM

## 2022-03-24 DIAGNOSIS — N179 Acute kidney failure, unspecified: Secondary | ICD-10-CM

## 2022-03-24 DIAGNOSIS — I10 Essential (primary) hypertension: Secondary | ICD-10-CM | POA: Diagnosis not present

## 2022-03-24 LAB — BASIC METABOLIC PANEL
Anion gap: 8 (ref 5–15)
BUN: 34 mg/dL — ABNORMAL HIGH (ref 8–23)
CO2: 25 mmol/L (ref 22–32)
Calcium: 8.9 mg/dL (ref 8.9–10.3)
Chloride: 107 mmol/L (ref 98–111)
Creatinine, Ser: 1.29 mg/dL — ABNORMAL HIGH (ref 0.44–1.00)
GFR, Estimated: 40 mL/min — ABNORMAL LOW (ref >60)
Glucose, Bld: 132 mg/dL — ABNORMAL HIGH (ref 70–99)
Potassium: 4 mmol/L (ref 3.5–5.1)
Sodium: 140 mmol/L (ref 135–145)

## 2022-03-24 LAB — CBC
HCT: 35.5 % — ABNORMAL LOW (ref 36.0–46.0)
Hemoglobin: 11.6 g/dL — ABNORMAL LOW (ref 12.0–15.0)
MCH: 30.6 pg (ref 26.0–34.0)
MCHC: 32.7 g/dL (ref 30.0–36.0)
MCV: 93.7 fL (ref 80.0–100.0)
Platelets: 154 10*3/uL (ref 150–400)
RBC: 3.79 MIL/uL — ABNORMAL LOW (ref 3.87–5.11)
RDW: 12 % (ref 11.5–15.5)
WBC: 8.2 10*3/uL (ref 4.0–10.5)
nRBC: 0 % (ref 0.0–0.2)

## 2022-03-24 LAB — HEPARIN LEVEL (UNFRACTIONATED): Heparin Unfractionated: 0.49 IU/mL (ref 0.30–0.70)

## 2022-03-24 LAB — GLUCOSE, CAPILLARY
Glucose-Capillary: 121 mg/dL — ABNORMAL HIGH (ref 70–99)
Glucose-Capillary: 214 mg/dL — ABNORMAL HIGH (ref 70–99)
Glucose-Capillary: 232 mg/dL — ABNORMAL HIGH (ref 70–99)
Glucose-Capillary: 129 mg/dL — ABNORMAL HIGH (ref 70–99)

## 2022-03-24 MED ORDER — APIXABAN 5 MG PO TABS
5.0000 mg | ORAL_TABLET | Freq: Two times a day (BID) | ORAL | Status: DC
  Administered 2022-03-24 – 2022-03-25 (×3): 5 mg via ORAL
  Filled 2022-03-24 (×3): qty 1

## 2022-03-24 MED ORDER — ISOSORBIDE MONONITRATE ER 30 MG PO TB24
15.0000 mg | ORAL_TABLET | Freq: Every day | ORAL | Status: DC
  Administered 2022-03-24 – 2022-03-25 (×2): 15 mg via ORAL
  Filled 2022-03-24: qty 1

## 2022-03-24 MED ORDER — ENALAPRIL MALEATE 2.5 MG PO TABS
2.5000 mg | ORAL_TABLET | Freq: Every day | ORAL | Status: DC
  Administered 2022-03-24: 2.5 mg via ORAL

## 2022-03-24 MED ORDER — APIXABAN 2.5 MG PO TABS: 2.5000 mg | ORAL_TABLET | Freq: Two times a day (BID) | ORAL | Status: DC

## 2022-03-24 NOTE — Progress Notes (Signed)
[place] for heparin Indication: chest pain/ACS  Allergies  Allergen Reactions   Nsaids Other (See Comments)    Per nephrologist   Patient Measurements: Height: '5\' 6"'$  (167.6 cm) Weight: 84.5 kg (186 lb 4.6 oz) IBW/kg (Calculated) : 59.3 Heparin Dosing Weight: 77 kg  Labs: Recent Labs    03/21/22 1144 03/21/22 1331 03/21/22 2330 03/22/22 0508 03/23/22 0351 03/23/22 1554 03/24/22 0425  HGB 11.6*  --   --  11.8*  --  11.8* 11.6*  HCT 35.6*  --   --  36.4  --  36.1 35.5*  PLT 147*  --   --  144*  --  176 154  HEPARINUNFRC  --   --    < > 0.32 0.14* 0.41 0.49  CREATININE 1.16*  --   --  0.95  --   --  1.29*  TROPONINIHS 216* 218*  --  215*  --   --   --    < > = values in this interval not displayed.    Estimated Creatinine Clearance: 33.7 mL/min (A) (by C-G formula based on SCr of 1.29 mg/dL (H)).  Assessment: 88 y.o. female with hx PAF, htn, DM, CKD consulted for heparin for CP x 48 hours.   HL 0.49- therapeutic  Goal of Therapy:  Heparin level 0.3-0.7 units/ml Monitor platelets by anticoagulation protocol: Yes   Plan:  Continue heparin infusion at 1150 units/hr Heparin level  and CBC daily Monitor for S/S of bleeding  Isac Sarna, BS Pharm D, BCPS Clinical Pharmacist 03/24/2022,7:51 AM

## 2022-03-24 NOTE — Progress Notes (Signed)
PROGRESS NOTE     Audrey Walker, is a 88 y.o. female, DOB - 11-23-1932, GUR:[id number]  Admit date - 03/21/2022   Admitting Physician Courage Denton Brick, MD  Outpatient Primary MD for the patient is Celene Squibb, MD  LOS - 3  Chief Complaint  Patient presents with   Weakness   Chest Pain         Subjective: Audrey Walker  Was seen and examined this morning, stable no acute distress denies any chest pain or shortness of breath  -Oxygen off and no hypoxia    Brief Narrative:  Audrey Walker 88 y.o. female with medical history significant of HTN, DM, stage 2 CKD, and afib (refused AC) admitted on 03/21/2022 with NSTEMI and chest pains   -Assessment and Plan: 1)NSTEMI--- hemodynamically stable, denies any chest pain or shortness of breath same  - Troponin 216 >>>218 >>215 -EKG without acute ST changes -LDL 94, total cholesterol 171 with HDL of 67 -Echo from 03/22/2022 with EF of 60 to 65%, no regional wall motion normalities patient does have grade 1 diastolic dysfunction.  No LVH, no pulmonary hypertension -Left atrium is mildly dilated , -No aortic stenosis or mitral stenosis -Cardiology team spoke with pt and daughter and they agreed to Med Rx w/o LHC -IV heparin x48 hours at least -Aspirin, imdur, Lipitor and Coreg as ordered (cardiology discontinued Plavix)  -Patient will need to be ambulated prior to discharge to make sure she does not have anginal symptoms -If persistent anginal symptoms patient will be willing to undergo LHC at that time  Today 03/24/2022, remain hemodynamically stable, chest pain-free -Continue to be on p.o. medicine including IV heparin -  -Remains chest pain-free, no bleeding concerns with IV heparin -Continue metoprolol 25 mg twice a day   2)PAFib-- CHA2DS2- VASc score   is = 7 (age x 2, DM2, HTN, Female, dCHF, MI)  Which is  equal to = 11.2 % annual risk of stroke  This patients CHA2DS2-VASc Score and unadjusted Ischemic Stroke Rate (% per  year) is equal to 11.2 % stroke rate/year from a score of 7 -Patient declines full anticoagulation for stroke prophylaxis -Metoprolol for rate control  TSH 0.9 -Echo as above with preserved EF and only mild atrial enlargement, and no significant valvular abnormalities -Normal sinus -Cardiology recommending to initiate Eliquis 5 mg twice daily  3)DM2--- A1c 6.3.Marland KitchenMarland Kitchen Reflecting excellent diabetic control PTA -Decrease Lantus to 8 units nightly Use Novolog/Humalog Sliding scale insulin with Accu-Cheks/Fingersticks as ordered   4) CKD stage - 3A   creatinine on admission= 1.16, -Which is close to patient's baseline -Creatinine currently trending down -renally adjust medications, avoid nephrotoxic agents / dehydration  / hypotension  5)Social/Ethics--- discussed with patient and patient's daughter [name] -Patient remains DNR/DNI without  limitations to treatment otherwise  6)HTN--stopped atenolol -Metoprolol and isosorbide as ordered -IV hydralazine as needed  7) Mild chronic anemia--- no bleeding concerns, may be related to CKD -Watch closely with initiation of IV heparin and DAPT  Disposition/Need for in-Hospital Stay- patient unable to be discharged at this time due to --NSTEMI requiring continuous IV heparin drip per ACS protocol -Possible discharge in 24 to 48 hours if remains chest pain-free especially with activity after completing IV heparin per ACS protocol  Status is: Inpatient   Disposition: The patient is from: Home              Anticipated d/c is to: Home  Anticipated d/c date is: 2 days              Patient currently is not medically stable to d/c. Barriers: Not Clinically Stable-   Code Status :  -  Code Status: DNR   Family Communication:    Discussed with daughter [name] and granddaughter at bedside  DVT Prophylaxis  :   - SCDs /Iv Heparin   apixaban (ELIQUIS) tablet 5 mg   Lab Results  Component Value Date   PLT 154 03/24/2022    Inpatient Medications  Scheduled Meds:  apixaban  5 mg Oral BID   aspirin EC  81 mg Oral Daily   atorvastatin  40 mg Oral q1800   fluticasone  1 spray Each Nare Daily   insulin aspart  0-9 Units Subcutaneous TID WC   insulin glargine-yfgn  8 Units Subcutaneous QHS   isosorbide mononitrate  15 mg Oral Daily   metoprolol tartrate  25 mg Oral BID   sodium chloride flush  3 mL Intravenous Q12H   Continuous Infusions:  sodium chloride     PRN Meds:.sodium chloride, acetaminophen, albuterol, hydrALAZINE, metoprolol tartrate, nitroGLYCERIN, ondansetron (ZOFRAN) IV, mouth rinse, sodium chloride flush   Anti-infectives (From admission, onward)    None      Objective: Vitals:   03/23/22 1034 03/23/22 1612 03/23/22 2036 03/24/22 0522  BP: 117/70 121/62 112/62 (!) 97/41  Pulse: 93 93 89 67  Resp: '16 18 20 16  '$ Temp: 98 F (36.7 C) 98 F (36.7 C) 97.7 F (36.5 C) 97.9 F (36.6 C)  TempSrc: Oral Oral  Oral  SpO2: 98% 100% 97% 95%  Weight:      Height:        Intake/Output Summary (Last 24 hours) at 03/24/2022 1245 Last data filed at 03/23/2022 1536 Gross per 24 hour  Intake 240 ml  Output --  Net 240 ml   Filed Weights   03/21/22 1106 03/22/22 1346  Weight: 83 kg 84.5 kg      Physical Exam:   General:  AAO x 3,  cooperative, no distress;   HEENT:  Normocephalic, PERRL, otherwise with in Normal limits   Neuro:  CNII-XII intact. , normal motor and sensation, reflexes intact   Lungs:   Clear to auscultation BL, Respirations unlabored,  No wheezes / crackles  Cardio:    S1/S2, RRR, No murmure, No Rubs or Gallops   Abdomen:  Soft, non-tender, bowel sounds active all four quadrants, no guarding or peritoneal signs.  Muscular  skeletal:  Limited exam -global generalized weaknesses - in bed, able to move all 4 extremities,   2+ pulses,  symmetric, No pitting edema  Skin:  Dry, warm to touch, negative for any Rashes,  Wounds: Please see nursing documentation           Data Reviewed: I have personally reviewed following labs and imaging studies  CBC: Recent Labs  Lab 03/21/22 1144 03/22/22 0508 03/23/22 1554 03/24/22 0425  WBC 7.0 7.2 8.6 8.2  NEUTROABS 4.9  --   --   --   HGB 11.6* 11.8* 11.8* 11.6*  HCT 35.6* 36.4 36.1 35.5*  MCV 95.4 95.8 95.0 93.7  PLT 147* 144* 176 500   Basic Metabolic Panel: Recent Labs  Lab 03/21/22 1144 03/22/22 0508 03/24/22 0425  NA 143 144 140  K 4.2 4.0 4.0  CL 107 108 107  CO2 '29 29 25  '$ GLUCOSE 103* 86 132*  BUN 30* 25* 34*  CREATININE 1.16*  0.95 1.29*  CALCIUM 9.5 9.3 8.9   GFR: Estimated Creatinine Clearance: 33.7 mL/min (A) (by C-G formula based on SCr of 1.29 mg/dL (H)). Liver Function Tests: Recent Labs  Lab 03/21/22 1144  AST 40  ALT 35  ALKPHOS 62  BILITOT 1.0  PROT 6.3*  ALBUMIN 3.8   HbA1C: Recent Labs    03/22/22 0508  HGBA1C 6.3*   Radiology Studies: No results found.   Scheduled Meds:  apixaban  5 mg Oral BID   aspirin EC  81 mg Oral Daily   atorvastatin  40 mg Oral q1800   fluticasone  1 spray Each Nare Daily   insulin aspart  0-9 Units Subcutaneous TID WC   insulin glargine-yfgn  8 Units Subcutaneous QHS   isosorbide mononitrate  15 mg Oral Daily   metoprolol tartrate  25 mg Oral BID   sodium chloride flush  3 mL Intravenous Q12H   Continuous Infusions:  sodium chloride       LOS: 3 days   Deatra James M.D on 03/24/2022 at 12:45 PM  Go to [email or web address] - for contact info  [facility] - Office  [phone]  If 7PM-7AM, please contact night-coverage [email or web address] 03/24/2022, 12:45 PM

## 2022-03-24 NOTE — Progress Notes (Signed)
[place] for heparin-> eliquis Indication: chest pain/ACS  Allergies  Allergen Reactions   Nsaids Other (See Comments)    Per nephrologist   Patient Measurements: Height: '5\' 6"'$  (167.6 cm) Weight: 84.5 kg (186 lb 4.6 oz) IBW/kg (Calculated) : 59.3 Heparin Dosing Weight: 77 kg  Labs: Recent Labs    03/21/22 1144 03/21/22 1331 03/21/22 2330 03/22/22 0508 03/23/22 0351 03/23/22 1554 03/24/22 0425  HGB 11.6*  --   --  11.8*  --  11.8* 11.6*  HCT 35.6*  --   --  36.4  --  36.1 35.5*  PLT 147*  --   --  144*  --  176 154  HEPARINUNFRC  --   --    < > 0.32 0.14* 0.41 0.49  CREATININE 1.16*  --   --  0.95  --   --  1.29*  TROPONINIHS 216* 218*  --  215*  --   --   --    < > = values in this interval not displayed.    Estimated Creatinine Clearance: 33.7 mL/min (A) (by C-G formula based on SCr of 1.29 mg/dL (H)).  Assessment: 88 y.o. female with hx PAF, htn, DM, CKD consulted for heparin for CP x 48 hours.   HL 0.49- therapeutic  Now plan to transition to po eliquis  Goal of Therapy:  Heparin level 0.3-0.7 units/ml Monitor platelets by anticoagulation protocol: Yes   Plan:  D/C heparin Eliquis '5mg'$  po BID Educated on eliquis  CBC daily Monitor for S/S of bleeding  Isac Sarna, BS Pharm D, BCPS Clinical Pharmacist 03/24/2022,10:55 AM

## 2022-03-24 NOTE — Evaluation (Signed)
Physical Therapy Evaluation Patient Details Name: Audrey Walker MRN: [id number] DOB: 02-Apr-1933 Today's Date: 03/24/2022  History of Present Illness  per Dr. Lorin Walker, "Audrey Walker is a 88 y.o. female with medical history significant of HTN, DM, stage 2 CKD, and afib (refused AC) presenting with chest pain.  She reports that she was somewhat weak and ataxic Saturday and Sunday.  She went to bed last night with her glucose >200 (last A1c was reported to be 6.1) but awoke feeling funny about 2AM.  She had tingling in her hands and pain in both arms.  She checked her glucose and it was 64.  She ate some chocolate and then honey and rechecked and it eventually improved.  In the meantime, she noticed a "twinge" in her left chest that came and went until it resolved spontaneously in the ER"  Clinical Impression  Pt is mod I in mobility and does not need skilled care at this time.        Recommendations for follow up therapy are one component of a multi-disciplinary discharge planning process, led by the attending physician.  Recommendations may be updated based on patient status, additional functional criteria and insurance authorization.  Follow Up Recommendations No PT follow up      Assistance Recommended at Discharge None  Patient can return home with the following  Assist for transportation    Equipment Recommendations None recommended by PT  Recommendations for Other Services       Functional Status Assessment Patient has not had a recent decline in their functional status     Precautions / Restrictions Precautions Precautions: None Restrictions Weight Bearing Restrictions: No      Mobility  Bed Mobility Overal bed mobility: Modified Independent                  Transfers Overall transfer level: Modified independent                      Ambulation/Gait Ambulation/Gait assistance: Modified independent (Device/Increase time) Gait Distance (Feet): 200  Feet Assistive device: Rolling walker (2 wheels) Gait Pattern/deviations: WFL(Within Functional Limits)   Gait velocity interpretation: 1.31 - 2.62 ft/sec, indicative of limited community Social research officer, government Rankin (Stroke Patients Only)       Balance                                             Pertinent Vitals/Pain Pain Assessment Pain Assessment: No/denies pain    Home Living Family/patient expects to be discharged to:: Private residence Living Arrangements: Alone Available Help at Discharge: Available PRN/intermittently;Friend(s) Type of Home: House Home Access: Stairs to enter Entrance Stairs-Rails: None Entrance Stairs-Number of Steps: 1   Home Layout: Able to live on main level with bedroom/bathroom Home Equipment: Conservation officer, nature (2 wheels);Wheelchair - manual;Cane - single point;Grab bars - tub/shower      Prior Function Prior Level of Function : Independent/Modified Independent                        Extremity/Trunk Assessment        Lower Extremity Assessment Lower Extremity Assessment: Overall WFL for tasks assessed       Communication      Cognition  Arousal/Alertness: Awake/alert Behavior During Therapy: WFL for tasks assessed/performed Overall Cognitive Status: Within Functional Limits for tasks assessed                                                 Assessment/Plan    PT Assessment Patient does not need any further PT services  PT Problem List                       AM-PAC PT "6 Clicks" Mobility  Outcome Measure Help needed turning from your back to your side while in a flat bed without using bedrails?: None Help needed moving from lying on your back to sitting on the side of a flat bed without using bedrails?: None Help needed moving to and from a bed to a chair (including a wheelchair)?: None Help needed standing up from a chair  using your arms (e.g., wheelchair or bedside chair)?: None Help needed to walk in hospital room?: None Help needed climbing 3-5 steps with a railing? : A Little 6 Click Score: 23    End of Session Equipment Utilized During Treatment: Gait belt Activity Tolerance: Patient tolerated treatment well Patient left: in chair;with call bell/phone within reach Nurse Communication: Mobility status PT Visit Diagnosis: Muscle weakness (generalized) (M62.81)    Time: 7673-4193 PT Time Calculation (min) (ACUTE ONLY): 28 min   Charges:   PT Evaluation $PT Eval Low Complexity: [place], PT CLT [phone]  03/24/2022, 10:43 AM

## 2022-03-24 NOTE — Progress Notes (Signed)
Palliative:  Unfortunately palliative is unable to see today. We will plan to see 03/28/22 on next date of palliative inpatient service. Recommend referral to outpatient palliative services if discharge prior to 03/28/22.   No charge  Vinie Sill, NP Palliative Medicine Team Pager [phone] (Please see [email or web address] for schedule) Team Phone [phone]

## 2022-03-24 NOTE — Progress Notes (Signed)
Pt pleasant and cooperative on this writes shift. All needs met, call bell in reach. No c/o pain or discomfort. Will continue to monitor this pt.

## 2022-03-24 NOTE — Progress Notes (Signed)
Rounding Note    Patient Name: Audrey Walker Date of Encounter: 03/24/2022  [place] Cardiologist: Rozann Lesches, MD   Subjective   Converted to NSR overnight.  Mild bump in Cr (0.95>1.29).  BP 97/41 this morning.  Denies any chest pain or dyspnea  Inpatient Medications    Scheduled Meds:  aspirin EC  81 mg Oral Daily   atorvastatin  40 mg Oral q1800   clopidogrel  75 mg Oral Daily   enalapril  2.5 mg Oral Daily   fluticasone  1 spray Each Nare Daily   insulin aspart  0-9 Units Subcutaneous TID WC   insulin glargine-yfgn  8 Units Subcutaneous QHS   isosorbide mononitrate  15 mg Oral Daily   metoprolol tartrate  25 mg Oral BID   sodium chloride flush  3 mL Intravenous Q12H   Continuous Infusions:  sodium chloride     heparin 1,150 Units/hr (03/23/22 1643)   PRN Meds: sodium chloride, acetaminophen, albuterol, hydrALAZINE, metoprolol tartrate, nitroGLYCERIN, ondansetron (ZOFRAN) IV, mouth rinse, sodium chloride flush   Vital Signs    Vitals:   03/23/22 1034 03/23/22 1612 03/23/22 2036 03/24/22 0522  BP: 117/70 121/62 112/62 (!) 97/41  Pulse: 93 93 89 67  Resp: '16 18 20 16  '$ Temp: 98 F (36.7 C) 98 F (36.7 C) 97.7 F (36.5 C) 97.9 F (36.6 C)  TempSrc: Oral Oral  Oral  SpO2: 98% 100% 97% 95%  Weight:      Height:        Intake/Output Summary (Last 24 hours) at 03/24/2022 0955 Last data filed at 03/23/2022 1536 Gross per 24 hour  Intake 240 ml  Output --  Net 240 ml       03/22/2022    1:46 PM 03/21/2022   11:06 AM 11/22/2021    1:21 PM  Last 3 Weights  Weight (lbs) 186 lb 4.6 oz 183 lb 182 lb  Weight (kg) 84.5 kg 83.008 kg 82.555 kg      Telemetry    NSR-  Personally Reviewed  ECG    N/A  Physical Exam   GEN: No acute distress.  Elderly Neck: No JVD Cardiac: RRR, no murmurs, rubs, or gallops.  Respiratory: Clear to auscultation bilaterally. GI: Soft, nontender, non-distended  MS: No edema; No deformity. Neuro:   Nonfocal  Psych: Normal affect   Labs    High Sensitivity Troponin:   Recent Labs  Lab 03/21/22 1144 03/21/22 1331 03/22/22 0508  TROPONINIHS 216* 218* 215*      Chemistry Recent Labs  Lab 03/21/22 1144 03/22/22 0508 03/24/22 0425  NA 143 144 140  K 4.2 4.0 4.0  CL 107 108 107  CO2 '29 29 25  '$ GLUCOSE 103* 86 132*  BUN 30* 25* 34*  CREATININE 1.16* 0.95 1.29*  CALCIUM 9.5 9.3 8.9  PROT 6.3*  --   --   ALBUMIN 3.8  --   --   AST 40  --   --   ALT 35  --   --   ALKPHOS 62  --   --   BILITOT 1.0  --   --   GFRNONAA 46* 58* 40*  ANIONGAP '7 7 8     '$ Lipids  Recent Labs  Lab 03/22/22 0508  CHOL 171  TRIG 48  HDL 67  LDLCALC 94  CHOLHDL 2.6     Hematology Recent Labs  Lab 03/22/22 0508 03/23/22 1554 03/24/22 0425  WBC 7.2 8.6 8.2  RBC 3.80* 3.80*  3.79*  HGB 11.8* 11.8* 11.6*  HCT 36.4 36.1 35.5*  MCV 95.8 95.0 93.7  MCH 31.1 31.1 30.6  MCHC 32.4 32.7 32.7  RDW 12.0 12.0 12.0  PLT 144* 176 154    Thyroid  Recent Labs  Lab 03/22/22 1435  TSH 0.904     BNPNo results for input(s): "BNP", "PROBNP" in the last 168 hours.  DDimer No results for input(s): "DDIMER" in the last 168 hours.   Radiology    No results found.  Cardiac Studies   Echo 03/22/22:  1. Left ventricular ejection fraction, by estimation, is 60 to 65%. The  left ventricle has normal function. The left ventricle has no regional  wall motion abnormalities. Left ventricular diastolic parameters are  consistent with Grade I diastolic  dysfunction (impaired relaxation).   2. Right ventricular systolic function is normal. The right ventricular  size is normal. There is normal pulmonary artery systolic pressure. The  estimated right ventricular systolic pressure is 47.6 mmHg.   3. Left atrial size was mildly dilated.   4. The mitral valve is normal in structure. Trivial mitral valve  regurgitation. No evidence of mitral stenosis.   5. Tricuspid valve regurgitation is moderate.    6. The aortic valve is tricuspid. Aortic valve regurgitation is not  visualized. No aortic stenosis is present.   7. Pulmonic valve regurgitation is moderate.   8. The inferior vena cava is normal in size with greater than 50%  respiratory variability, suggesting right atrial pressure of 3 mmHg.   Patient Profile     88 y.o. female with medical history significant of HTN, DM, CKD, and afib presents with NSTEMI   Assessment & Plan     NSTEMI: Presented with chest pain, mild troponin elevation but flat (216 > 218 > 215).  Echocardiogram showed EF 60 to 65%.  Dr. Marlou Porch discussed with with patient on 10/10, and patient and family agreed for medical management.   -Completed IV heparin x48 hours, can discontinue.  She is agreeable to starting Eliquis given her atrial fibrillation, will transition to Eliquis -Continue aspirin, Imdur, Lipitor, metoprolol.  She was started on Plavix, but given she will also be on Eliquis as above, will discontinue Plavix to avoid triple therapy  Paroxysmal atrial fibrillation: CHA2DS2-VASc score 6 (age x2, hypertension, diabetes, female, CAD).  She has declined anticoagulation in the past. Went into Afib 10/11 but converted back to sinus rhythm -Continue metoprolol 25 mg twice daily -She has declined anticoagulation in the past but is now agreeable to starting.  Will start Eliquis 5 mg twice daily  AKI on CKD: Creatinine 1.29 this morning, will hold enalapril  Hypertension: On metoprolol and Imdur and enalapril.  Mild bump in creatinine and soft BP, will hold enalapril   For questions or updates, please contact [place] Please consult [email or web address] for contact info under    Donato Heinz, MD  03/24/2022, 9:55 AM

## 2022-03-25 ENCOUNTER — Telehealth: Payer: Self-pay

## 2022-03-25 DIAGNOSIS — N179 Acute kidney failure, unspecified: Secondary | ICD-10-CM

## 2022-03-25 DIAGNOSIS — I214 Non-ST elevation (NSTEMI) myocardial infarction: Secondary | ICD-10-CM | POA: Diagnosis not present

## 2022-03-25 LAB — GLUCOSE, CAPILLARY: Glucose-Capillary: 118 mg/dL — ABNORMAL HIGH (ref 70–99)

## 2022-03-25 LAB — CBC
HCT: 35 % — ABNORMAL LOW (ref 36.0–46.0)
Hemoglobin: 11.3 g/dL — ABNORMAL LOW (ref 12.0–15.0)
MCH: 30.7 pg (ref 26.0–34.0)
MCHC: 32.3 g/dL (ref 30.0–36.0)
MCV: 95.1 fL (ref 80.0–100.0)
Platelets: 152 10*3/uL (ref 150–400)
RBC: 3.68 MIL/uL — ABNORMAL LOW (ref 3.87–5.11)
RDW: 11.9 % (ref 11.5–15.5)
WBC: 7.7 10*3/uL (ref 4.0–10.5)
nRBC: 0 % (ref 0.0–0.2)

## 2022-03-25 MED ORDER — ATORVASTATIN CALCIUM 40 MG PO TABS: 40.0000 mg | ORAL_TABLET | Freq: Every day | ORAL | 0 refills | Status: DC

## 2022-03-25 MED ORDER — ENALAPRIL MALEATE 2.5 MG PO TABS: 2.5000 mg | ORAL_TABLET | Freq: Every day | ORAL | 1 refills | Status: DC

## 2022-03-25 MED ORDER — NITROGLYCERIN 0.4 MG SL SUBL: 0.4000 mg | SUBLINGUAL_TABLET | SUBLINGUAL | 12 refills | Status: DC | PRN

## 2022-03-25 MED ORDER — APIXABAN 5 MG PO TABS: 5.0000 mg | ORAL_TABLET | Freq: Two times a day (BID) | ORAL | 3 refills | Status: DC

## 2022-03-25 MED ORDER — ISOSORBIDE MONONITRATE ER 30 MG PO TB24: 15.0000 mg | ORAL_TABLET | Freq: Every day | ORAL | 1 refills | Status: DC

## 2022-03-25 MED ORDER — ASPIRIN 81 MG PO TBEC: 81.0000 mg | DELAYED_RELEASE_TABLET | Freq: Every day | ORAL | 12 refills | Status: DC

## 2022-03-25 MED ORDER — METOPROLOL TARTRATE 25 MG PO TABS: 25.0000 mg | ORAL_TABLET | Freq: Two times a day (BID) | ORAL | 1 refills | Status: DC

## 2022-03-25 NOTE — TOC Transition Note (Signed)
Transition of Care [facility]) - CM/SW Discharge Note   Patient Details  Name: Audrey Walker MRN: [id number] Date of Birth: [birth year]  Transition of Care [facility]) CM/SW Contact:  Shade Flood, LCSW Phone Number: 03/25/2022, 11:40 AM   Clinical Narrative:     Pt stable for dc and HH orders entered. Discussed dc planning with pt and her daughter and reviewed HH recommendation. Pt agreeable. CMS provider options reviewed. Referral made to [facility]. They will follow up with pt at home.  No other TOC needs for dc.   Barriers to Discharge: Barriers Resolved   Patient Goals and CMS Choice Patient states their goals for this hospitalization and ongoing recovery are:: go home CMS Medicare.gov Compare Post Acute Care list provided to:: Patient Choice offered to / list presented to : Patient  Expected Discharge Plan and Services   In-house Referral: Clinical Social Work   Post Acute Care Choice: [place] arrangements for the past 2 months: [place] Expected Discharge Date: 03/25/22                         HH Arranged: RN, PT, Social Work [employer] Agency: [place] Date [place] Agency Contacted: 03/25/22   Representative spoke with at [place]: Santiago Glad  Prior Living Arrangements/Services Living arrangements for the past 2 months: [place] Lives with:: Self Patient language and need for interpreter reviewed:: Yes Do you feel safe going back to the place where you live?: Yes      Need for Family Participation in Patient Care: No (Comment) Care giver support system in place?: Yes (comment)   Criminal Activity/Legal Involvement Pertinent to Current Situation/Hospitalization: No - Comment as needed  Activities of Daily Living Home Assistive Devices/Equipment: Eyeglasses, Dentures (specify type), Walker (specify type), Cane (specify quad or straight), Shower chair with back ADL Screening (condition at time of admission) Patient's cognitive  ability adequate to safely complete daily activities?: Yes Is the patient deaf or have difficulty hearing?: Yes Does the patient have difficulty seeing, even when wearing glasses/contacts?: No Does the patient have difficulty concentrating, remembering, or making decisions?: Yes Patient able to express need for assistance with ADLs?: Yes Does the patient have difficulty dressing or bathing?: No Independently performs ADLs?: No Communication: Independent Dressing (OT): Independent Grooming: Independent Feeding: Independent Bathing: Independent, Needs assistance Is this a change from baseline?: Change from baseline, expected to last <3 days Toileting: Needs assistance Is this a change from baseline?: Change from baseline, expected to last <3 days In/Out Bed: Needs assistance Is this a change from baseline?: Change from baseline, expected to last <3 days Walks in Home: [place] with device (comment) Does the patient have difficulty walking or climbing stairs?: Yes Weakness of Legs: None Weakness of Arms/Hands: None  Permission Sought/Granted Permission sought to share information with : Chartered certified accountant granted to share information with : Yes, Verbal Permission Granted     Permission granted to share info w AGENCY: HH        Emotional Assessment   Attitude/Demeanor/Rapport: Engaged Affect (typically observed): Pleasant Orientation: : Oriented to Self, Oriented to Place, Oriented to  Time, Oriented to Situation Alcohol / Substance Use: Not Applicable Psych Involvement: No (comment)  Admission diagnosis:  NSTEMI (non-ST elevated myocardial infarction) ([place]) [I21.4] Patient Active Problem List   Diagnosis Date Noted   PAF (paroxysmal atrial fibrillation) (HCC)    AKI (acute kidney injury) ([place])    NSTEMI (non-ST elevated myocardial  infarction) ([place]) 03/21/2022   Chronic kidney disease, stage 3a ([place]) 03/21/2022   DNR (do not  resuscitate) 03/21/2022   Hyponatremia 06/01/2020   COVID-19 virus infection 06/01/2020   Type 2 diabetes mellitus ([place]) 04/29/2013   Atrial flutter ([place]) 04/29/2013   Essential hypertension, benign 04/29/2013   PCP:  Celene Squibb, MD Pharmacy:   [facility] ([place]) [address], [place] [address] Phone: [phone] Fax: [phone]  [place] [address] [place] [address] Phone: [phone] Fax: [phone]     Social Determinants of Health (SDOH) Interventions    Readmission Risk Interventions     No data to display           Final next level of care: [place] Barriers to Discharge: Barriers Resolved   Patient Goals and CMS Choice Patient states their goals for this hospitalization and ongoing recovery are:: go home CMS Medicare.gov Compare Post Acute Care list provided to:: Patient Choice offered to / list presented to : Patient  Discharge Placement                       Discharge Plan and Services In-house Referral: Clinical Social Work   Post Acute Care Choice: Home Health                    HH Arranged: RN, PT, Social Work [employer] Agency: [place] Date [facility] Agency Contacted: 03/25/22   Representative spoke with at [place]: Santiago Glad  Social Determinants of Health (SDOH) Interventions     Readmission Risk Interventions     No data to display

## 2022-03-25 NOTE — Progress Notes (Signed)
This patient has been referred to [place] program. Unfortunately, she does not qualify for our outpatient program at this time.    Thank you, Lorelee Market, LPN [facility] Liaison [phone]

## 2022-03-25 NOTE — Progress Notes (Signed)
Progress Note  Patient Name: Audrey Walker Date of Encounter: 03/25/2022  Primary Cardiologist: Rozann Lesches, MD   Subjective   Overnight No events. Stable Hgb on DOAC.  No PRN BB needed. Patient notes that she feels well No CP, SOB, Palpitations.  Inpatient Medications    Scheduled Meds:  apixaban  5 mg Oral BID   aspirin EC  81 mg Oral Daily   atorvastatin  40 mg Oral q1800   fluticasone  1 spray Each Nare Daily   insulin aspart  0-9 Units Subcutaneous TID WC   insulin glargine-yfgn  8 Units Subcutaneous QHS   isosorbide mononitrate  15 mg Oral Daily   metoprolol tartrate  25 mg Oral BID   sodium chloride flush  3 mL Intravenous Q12H   Continuous Infusions:  sodium chloride     PRN Meds: sodium chloride, acetaminophen, albuterol, hydrALAZINE, metoprolol tartrate, nitroGLYCERIN, ondansetron (ZOFRAN) IV, mouth rinse, sodium chloride flush   Vital Signs    Vitals:   03/24/22 0522 03/24/22 1430 03/24/22 2119 03/25/22 0405  BP: (!) 97/41 (!) 108/51 (!) 119/54 (!) 134/48  Pulse: 67 70 71 66  Resp: '16 16 18 19  '$ Temp: 97.9 F (36.6 C) 97.6 F (36.4 C) 98 F (36.7 C) (!) 96.7 F (35.9 C)  TempSrc: Oral Oral    SpO2: 95% 99% 98% 98%  Weight:      Height:       No intake or output data in the 24 hours ending 03/25/22 0829 Filed Weights   03/21/22 1106 03/22/22 1346  Weight: 83 kg 84.5 kg    Telemetry    None presently - Personally Reviewed  Physical Exam   Gen: No distress Neck: No JVD Cardiac: No Rubs or Gallops, no murmur, RRR +2radial pulses Respiratory: Clear to auscultation bilaterally, normal effort, normal  respiratory rate GI: Soft, nontender, non-distended  Integument: Skin feels warm Neuro:  At time of evaluation, alert and oriented to person/place/time/situation; she is worried she may forget her discharge instructions  Labs    Chemistry Recent Labs  Lab 03/21/22 1144 03/22/22 0508 03/24/22 0425  NA 143 144 140  K 4.2 4.0 4.0   CL 107 108 107  CO2 '29 29 25  '$ GLUCOSE 103* 86 132*  BUN 30* 25* 34*  CREATININE 1.16* 0.95 1.29*  CALCIUM 9.5 9.3 8.9  PROT 6.3*  --   --   ALBUMIN 3.8  --   --   AST 40  --   --   ALT 35  --   --   ALKPHOS 62  --   --   BILITOT 1.0  --   --   GFRNONAA 46* 58* 40*  ANIONGAP '7 7 8     '$ Hematology Recent Labs  Lab 03/23/22 1554 03/24/22 0425 03/25/22 0416  WBC 8.6 8.2 7.7  RBC 3.80* 3.79* 3.68*  HGB 11.8* 11.6* 11.3*  HCT 36.1 35.5* 35.0*  MCV 95.0 93.7 95.1  MCH 31.1 30.6 30.7  MCHC 32.7 32.7 32.3  RDW 12.0 12.0 11.9  PLT 176 154 152    Cardiac EnzymesNo results for input(s): "TROPONINI" in the last 168 hours. No results for input(s): "TROPIPOC" in the last 168 hours.   BNPNo results for input(s): "BNP", "PROBNP" in the last 168 hours.   DDimer No results for input(s): "DDIMER" in the last 168 hours.   Radiology    No results found.  Patient Profile     88 y.o. female NSTEMI planned for  medical management and PAF  Assessment & Plan     NSTEMI - Troponin peak 218 - 03/22/22 discussed for medical mgmt, s/p 48 hours of heparin - ASA, low dose Imdur, statin a,d low dose BB; no plan for triple therapy - no WMA - has plans for outpatient palliative care; reasonable  AKI  - ACE I held, consider at outpatient  PAF - CHADSVASC 6 and now on eliquis (wt, 84 Kg, Cr < 2, on 5 mg PO BID) - BB as above  [place] will sign off.   Medication Recommendations:  as above, no chagnes from 03/24/22 Other recommendations (labs, testing, etc):  BMP Follow up as an outpatient:  04/13/22 visit, 2:40 PM  Attempted to call Daughter but she is still working at [employer].  If there are additional cardiology questions, send a secure chat and I will attempt to call daughter again Vickii Chafe)  For questions or updates, please contact [facility] Please consult [email or web address] for contact info under Cardiology/STEMI.      Rudean Haskell, MD  [address] [place], [place] [address] [phone]  8:29 AM

## 2022-03-25 NOTE — Discharge Summary (Addendum)
Physician Discharge Summary   Patient: Audrey Walker MRN: [id number] DOB: January 25, 1933  Admit date:     03/21/2022  Discharge date: 03/25/22  Discharge Physician: Deatra James   PCP: Celene Squibb, MD   Recommendations at discharge:   Follow-up with cardiologist in 1 week. Follow-up with a PCP in 1-2 weeks Follow-up with outpatient palliative care Continue current recommended medication by cardiology Eliquis,aspirin, Imdur, Lipitor, metoprolol  Discharge Diagnoses: Principal Problem:   NSTEMI (non-ST elevated myocardial infarction) ([place]) Active Problems:   Type 2 diabetes mellitus ([place])   Atrial flutter ([place])   Essential hypertension, benign   Chronic kidney disease, stage 3a ([place])   DNR (do not resuscitate)   PAF (paroxysmal atrial fibrillation) ([place])   AKI (acute kidney injury) ([place])  Resolved Problems:   * No resolved hospital problems. *  Hospital Course:  Audrey Walker is a 88 y.o. female with medical history significant of HTN, DM, stage 2 CKD, and afib (refused AC) admitted on 03/21/2022 with NSTEMI and chest pains    1)NSTEMI--- hemodynamically stable, denies any chest pain or shortness of breath same  - Troponin 216 >>>218 >>215 -EKG without acute ST changes -LDL 94, total cholesterol 171 with HDL of 67 -Echo from 03/22/2022 with EF of 60 to 65%, no regional wall motion normalities patient does have grade 1 diastolic dysfunction.  No LVH, no pulmonary hypertension -Left atrium is mildly dilated , -No aortic stenosis or mitral stenosis -Cardiology team spoke with pt and daughter and they agreed to Med Rx w/o LHC -IV heparin x48 hours at least -Aspirin, imdur, Lipitor and Coreg as ordered (cardiology discontinued Plavix)   -Patient will need to be ambulated prior to discharge to make sure she does not have anginal symptoms -If persistent anginal symptoms patient will be willing to undergo LHC at that time  Remain hemodynamically stable, chest  pain-free -Heparin IV has been discontinued, patient has been transitioned to Eliquis by cardiology -Remains chest pain-free, no bleeding  -Continue metoprolol 25 mg twice a day   -Final recommendations by cardiology: To continue Eliquis, aspirin, Imdur, Lipitor, Toprol   2)PAFib-- CHA2DS2- VASc score   is = 7 (age x 2, DM2, HTN, Female, dCHF, MI)  Which is  equal to = 11.2 % annual risk of stroke  This patients CHA2DS2-VASc Score and unadjusted Ischemic Stroke Rate (% per year) is equal to 11.2 % stroke rate/year from a score of 7 -Patient declines full anticoagulation for stroke prophylaxis -Metoprolol for rate control  TSH 0.9 -Echo as above with preserved EF and only mild atrial enlargement, and no significant valvular abnormalities -Normal sinus -Cardiology recommending to initiate Eliquis 5 mg twice daily   3)DM2--- A1c 6.3.Marland KitchenMarland Kitchen Reflecting excellent diabetic control PTA -Decrease Lantus to 8 units nightly Use Novolog/Humalog Sliding scale insulin with Accu-Cheks/Fingersticks as ordered    4) CKD stage - 3A   creatinine on admission= 1.16, -Which is close to patient's baseline -Creatinine currently trending down -renally adjust medications, avoid nephrotoxic agents / dehydration  / hypotension   5)Social/Ethics--- discussed with patient and patient's daughter Audrey Walker -Patient remains DNR/DNI without  limitations to treatment otherwise   6)HTN--stopped atenolol -Metoprolol and isosorbide as ordered -IV hydralazine as needed   7) Mild chronic anemia--- no bleeding concerns, may be related to CKD -Watch closely with initiation of IV heparin and DAPT   Disposition/Need for in-Hospital Stay- patient unable to be discharged at this time due to --NSTEMI requiring continuous IV heparin drip per ACS  protocol -Possible discharge in 24 to 48 hours if remains chest pain-free especially with activity after completing IV heparin per ACS protocol   Status is: Inpatient     Disposition: The patient is from: Home              Anticipated d/c is to: Home              Anticipated d/c date is: 2 days              Patient currently is not medically stable to d/c. Barriers: Not Clinically Stable-    Code Status :  -  Code Status: DNR    Family Communication:    Discussed with daughter Audrey Walker and granddaughter at bedside Consultants: Cardiology Procedures performed: echo  Disposition: Home health Diet recommendation:  Discharge Diet Orders (From admission, onward)     Start     Ordered   03/25/22 0000  Diet - low sodium heart healthy        03/25/22 0749           Cardiac and Carb modified diet DISCHARGE MEDICATION: Allergies as of 03/25/2022       Reactions   Nsaids Other (See Comments)   Per nephrologist        Medication List     STOP taking these medications    atenolol 50 MG tablet Commonly known as: TENORMIN   vitamin C 1000 MG tablet   ZINC PO       TAKE these medications    acetaminophen 500 MG tablet Commonly known as: TYLENOL Take 500 mg by mouth every 6 (six) hours as needed for mild pain.   albuterol 108 (90 Base) MCG/ACT inhaler Commonly known as: VENTOLIN HFA Inhale 1-2 puffs into the lungs every 4 (four) hours as needed for wheezing or shortness of breath.   apixaban 5 MG Tabs tablet Commonly known as: ELIQUIS Take 1 tablet (5 mg total) by mouth 2 (two) times daily.   artificial tears ointment Place 2 drops into both eyes 3 (three) times daily.   aspirin EC 81 MG tablet Take 1 tablet (81 mg total) by mouth daily. Swallow whole.   atorvastatin 40 MG tablet Commonly known as: LIPITOR Take 1 tablet (40 mg total) by mouth daily at 6 PM.   B-D ULTRAFINE III SHORT PEN 31G X 8 MM Misc Generic drug: Insulin Pen Needle Inject 1 Syringe into the skin daily.   enalapril 2.5 MG tablet Commonly known as: VASOTEC Take 1 tablet (2.5 mg total) by mouth daily. What changed:  medication strength how  much to take   fish oil-omega-3 fatty acids 1000 MG capsule Take 1 g by mouth daily.   fluticasone 50 MCG/ACT nasal spray Commonly known as: FLONASE Place 1 spray into both nostrils daily.   HAIR/SKIN/NAILS PO Take 1 tablet by mouth daily.   isosorbide mononitrate 30 MG 24 hr tablet Commonly known as: IMDUR Take 0.5 tablets (15 mg total) by mouth daily.   metoprolol tartrate 25 MG tablet Commonly known as: LOPRESSOR Take 1 tablet (25 mg total) by mouth 2 (two) times daily.   milk thistle 175 MG tablet Take 175 mg by mouth daily.   nitroGLYCERIN 0.4 MG SL tablet Commonly known as: NITROSTAT Place 1 tablet (0.4 mg total) under the tongue every 5 (five) minutes x 3 doses as needed for up to 3 days for chest pain.   ONE TOUCH ULTRA 2 w/Device Kit   Tyler Aas FlexTouch  200 UNIT/ML FlexTouch Pen Generic drug: insulin degludec Inject 18 Units into the skin at bedtime.   VITAMIN B 12 PO Take 1 tablet by mouth daily.   Vitamin D3 1.25 MG (50000 UT) Caps Take 1 capsule by mouth daily.        Follow-up Information     Satira Sark, MD Follow up on 04/13/2022.   Specialty: Cardiology Why: Cardiology Follow-up on 04/13/2022 at 2:40 PM. Contact information: [place] [address] [phone]                Discharge Exam: Filed Weights   03/21/22 1106 03/22/22 1346  Weight: 83 kg 84.5 kg      Physical Exam:   General:  AAO x 3,  cooperative, no distress;   HEENT:  Normocephalic, PERRL, otherwise with in Normal limits   Neuro:  CNII-XII intact. , normal motor and sensation, reflexes intact   Lungs:   Clear to auscultation BL, Respirations unlabored,  No wheezes / crackles  Cardio:    S1/S2, RRR, No murmure, No Rubs or Gallops   Abdomen:  Soft, non-tender, bowel sounds active all four quadrants, no guarding or peritoneal signs.  Muscular  skeletal:  Limited exam -global generalized weaknesses - in bed, able to move all 4 extremities,    2+ pulses,  symmetric, No pitting edema  Skin:  Dry, warm to touch, negative for any Rashes,  Wounds: Please see nursing documentation          Condition at discharge: good  The results of significant diagnostics from this hospitalization (including imaging, microbiology, ancillary and laboratory) are listed below for reference.   Imaging Studies: ECHOCARDIOGRAM COMPLETE  Result Date: 03/22/2022    ECHOCARDIOGRAM REPORT   Patient Name:   Maisy Bojanowski Date of Exam: 03/22/2022 Medical Rec #:  [id number]     Height:       66.0 in Accession #:    [id number]    Weight:       183.0 lb Date of Birth:  [birth year]     BSA:          1.926 m Patient Age:    11 years      BP:           146/61 mmHg Patient Gender: F             HR:           69 bpm. Exam Location:  [facility] Procedure: 2D Echo, Color Doppler and Cardiac Doppler Indications:    R07.9* Chest pain, unspecified  History:        Patient has prior history of Echocardiogram examinations, most                 recent 08/23/2012. Arrythmias:Atrial Flutter; Risk                 Factors:Hypertension and Diabetes.  Sonographer:    Raquel Sarna Senior RDCS Referring Phys: [place]  1. Left ventricular ejection fraction, by estimation, is 60 to 65%. The left ventricle has normal function. The left ventricle has no regional wall motion abnormalities. Left ventricular diastolic parameters are consistent with Grade I diastolic dysfunction (impaired relaxation).  2. Right ventricular systolic function is normal. The right ventricular size is normal. There is normal pulmonary artery systolic pressure. The estimated right ventricular systolic pressure is 26.9 mmHg.  3. Left atrial size was mildly dilated.  4. The mitral valve is normal in structure.  Trivial mitral valve regurgitation. No evidence of mitral stenosis.  5. Tricuspid valve regurgitation is moderate.  6. The aortic valve is tricuspid. Aortic valve regurgitation is not visualized. No  aortic stenosis is present.  7. Pulmonic valve regurgitation is moderate.  8. The inferior vena cava is normal in size with greater than 50% respiratory variability, suggesting right atrial pressure of 3 mmHg. FINDINGS  Left Ventricle: Left ventricular ejection fraction, by estimation, is 60 to 65%. The left ventricle has normal function. The left ventricle has no regional wall motion abnormalities. The left ventricular internal cavity size was normal in size. There is  no left ventricular hypertrophy. Left ventricular diastolic parameters are consistent with Grade I diastolic dysfunction (impaired relaxation). Right Ventricle: The right ventricular size is normal. No increase in right ventricular wall thickness. Right ventricular systolic function is normal. There is normal pulmonary artery systolic pressure. The tricuspid regurgitant velocity is 2.55 m/s, and  with an assumed right atrial pressure of 3 mmHg, the estimated right ventricular systolic pressure is 78.4 mmHg. Left Atrium: Left atrial size was mildly dilated. Right Atrium: Right atrial size was normal in size. Pericardium: There is no evidence of pericardial effusion. Presence of epicardial fat layer. Mitral Valve: The mitral valve is normal in structure. Trivial mitral valve regurgitation. No evidence of mitral valve stenosis. Tricuspid Valve: The tricuspid valve is normal in structure. Tricuspid valve regurgitation is moderate . No evidence of tricuspid stenosis. Aortic Valve: The aortic valve is tricuspid. Aortic valve regurgitation is not visualized. No aortic stenosis is present. Pulmonic Valve: The pulmonic valve was normal in structure. Pulmonic valve regurgitation is moderate. No evidence of pulmonic stenosis. Aorta: The aortic root is normal in size and structure. Venous: The inferior vena cava is normal in size with greater than 50% respiratory variability, suggesting right atrial pressure of 3 mmHg. IAS/Shunts: No atrial level shunt detected  by color flow Doppler.  LEFT VENTRICLE PLAX 2D LVIDd:         3.70 cm     Diastology LVIDs:         2.60 cm     LV e' medial:    4.68 cm/s LV PW:         0.90 cm     LV E/e' medial:  15.7 LV IVS:        0.80 cm     LV e' lateral:   7.24 cm/s LVOT diam:     2.10 cm     LV E/e' lateral: 10.2 LV SV:         65 LV SV Index:   34 LVOT Area:     3.46 cm  LV Volumes (MOD) LV vol d, MOD A2C: 75.5 ml LV vol d, MOD A4C: 72.7 ml LV vol s, MOD A2C: 31.0 ml LV vol s, MOD A4C: 35.4 ml LV SV MOD A2C:     44.5 ml LV SV MOD A4C:     72.7 ml LV SV MOD BP:      43.0 ml RIGHT VENTRICLE RV S prime:     15.00 cm/s TAPSE (M-mode): 2.3 cm LEFT ATRIUM             Index        RIGHT ATRIUM           Index LA diam:        3.60 cm 1.87 cm/m   RA Area:     20.10 cm LA Vol (A2C):   69.0 ml 35.83  ml/m  RA Volume:   56.40 ml  29.29 ml/m LA Vol (A4C):   54.1 ml 28.09 ml/m LA Biplane Vol: 63.8 ml 33.13 ml/m  AORTIC VALVE LVOT Vmax:   67.20 cm/s LVOT Vmean:  57.100 cm/s LVOT VTI:    0.188 m  AORTA Ao Root diam: 2.90 cm Ao Asc diam:  3.30 cm MITRAL VALVE               TRICUSPID VALVE MV Area (PHT): 3.72 cm    TR Peak grad:   26.0 mmHg MV Decel Time: 204 msec    TR Vmax:        255.00 cm/s MV E velocity: 73.70 cm/s MV A velocity: 77.10 cm/s  SHUNTS MV E/A ratio:  0.96        Systemic VTI:  0.19 m                            Systemic Diam: 2.10 cm Candee Furbish MD Electronically signed by Candee Furbish MD Signature Date/Time: 03/22/2022/10:29:35 AM    Final    DG Chest 2 View  Result Date: 03/21/2022 CLINICAL DATA:  Chest pain EXAM: CHEST - 2 VIEW COMPARISON:  02/07/2022 FINDINGS: Artifact from EKG leads. Normal heart size and mediastinal contours. No acute infiltrate or edema. No effusion or pneumothorax. No acute osseous findings. IMPRESSION: No evidence of active disease. Electronically Signed   By: Jorje Guild M.D.   On: 03/21/2022 11:33    Microbiology: Results for orders placed or performed during the hospital encounter of 02/07/22   Urine Culture     Status: Abnormal   Collection Time: 02/07/22  5:51 PM   Specimen: Urine, Clean Catch  Result Value Ref Range Status   Specimen Description   Final    URINE, CLEAN CATCH Performed at [facility], [address] [place], [place] [address]    Special Requests   Final    NONE Performed at [facility], [address] [place], [place] [address]    Culture >=100,000 COLONIES/mL KLEBSIELLA PNEUMONIAE (A)  Final   Report Status 02/09/2022 FINAL  Final   Organism ID, Bacteria KLEBSIELLA PNEUMONIAE (A)  Final      Susceptibility   Klebsiella pneumoniae - MIC*    AMPICILLIN >=32 RESISTANT Resistant     CEFAZOLIN <=4 SENSITIVE Sensitive     CEFEPIME <=0.12 SENSITIVE Sensitive     CEFTRIAXONE <=0.25 SENSITIVE Sensitive     CIPROFLOXACIN <=0.25 SENSITIVE Sensitive     GENTAMICIN <=1 SENSITIVE Sensitive     IMIPENEM 1 SENSITIVE Sensitive     NITROFURANTOIN 64 INTERMEDIATE Intermediate     TRIMETH/SULFA <=20 SENSITIVE Sensitive     AMPICILLIN/SULBACTAM >=32 RESISTANT Resistant     PIP/TAZO <=4 SENSITIVE Sensitive     * >=100,000 COLONIES/mL KLEBSIELLA PNEUMONIAE  SARS Coronavirus 2 by RT PCR (hospital order, performed in [facility] hospital lab) *cepheid single result test* Anterior Nasal Swab     Status: None   Collection Time: 02/07/22  7:24 PM   Specimen: Anterior Nasal Swab  Result Value Ref Range Status   SARS Coronavirus 2 by RT PCR NEGATIVE NEGATIVE Final    Comment: (NOTE) SARS-CoV-2 target nucleic acids are NOT DETECTED.  The SARS-CoV-2 RNA is generally detectable in upper and lower respiratory specimens during the acute phase of infection. The lowest concentration of SARS-CoV-2 viral copies this assay can detect is 250 copies / mL. A negative result does not preclude  SARS-CoV-2 infection and should not be used as the sole basis for treatment or other patient management decisions.  A negative result may occur with improper specimen collection / handling,  submission of specimen other than nasopharyngeal swab, presence of viral mutation(s) within the areas targeted by this assay, and inadequate number of viral copies (<250 copies / mL). A negative result must be combined with clinical observations, patient history, and epidemiological information.  Fact Sheet for Patients:   https://www.patel.info/  Fact Sheet for Healthcare Providers: https://hall.com/  This test is not yet approved or  cleared by the [place] FDA and has been authorized for detection and/or diagnosis of SARS-CoV-2 by FDA under an Emergency Use Authorization (EUA).  This EUA will remain in effect (meaning this test can be used) for the duration of the COVID-19 declaration under Section 564(b)(1) of the Act, 21 U.S.C. section 360bbb-3(b)(1), unless the authorization is terminated or revoked sooner.  Performed at [facility], [address] [place], [place] [address]     Labs: CBC: Recent Labs  Lab 03/21/22 1144 03/22/22 0508 03/23/22 1554 03/24/22 0425 03/25/22 0416  WBC 7.0 7.2 8.6 8.2 7.7  NEUTROABS 4.9  --   --   --   --   HGB 11.6* 11.8* 11.8* 11.6* 11.3*  HCT 35.6* 36.4 36.1 35.5* 35.0*  MCV 95.4 95.8 95.0 93.7 95.1  PLT 147* 144* 176 154 299   Basic Metabolic Panel: Recent Labs  Lab 03/21/22 1144 03/22/22 0508 03/24/22 0425  NA 143 144 140  K 4.2 4.0 4.0  CL 107 108 107  CO2 $Re'29 29 25  'ROm$ GLUCOSE 103* 86 132*  BUN 30* 25* 34*  CREATININE 1.16* 0.95 1.29*  CALCIUM 9.5 9.3 8.9   Liver Function Tests: Recent Labs  Lab 03/21/22 1144  AST 40  ALT 35  ALKPHOS 62  BILITOT 1.0  PROT 6.3*  ALBUMIN 3.8   CBG: Recent Labs  Lab 03/24/22 0734 03/24/22 1200 03/24/22 1621 03/24/22 2121 03/25/22 0708  GLUCAP 121* 214* 232* 129* 118*    Discharge time spent: greater than 30 minutes.  Signed: Deatra James, MD [facility] 03/25/2022

## 2022-03-25 NOTE — Telephone Encounter (Signed)
-----   Message from Werner Lean, MD sent at 03/25/2022 10:15 AM EDT ----- Regarding: BMP in one week Hi team- can we get a BMP in one week for this lady, her enalapril was held for AKI and may need restarted.  Thanks, MAC

## 2022-03-25 NOTE — Telephone Encounter (Signed)
Order placed for [facility]

## 2022-03-25 NOTE — Progress Notes (Signed)
Ng Discharge Note  Admit Date:  03/21/2022 Discharge date: 03/25/2022   Audrey Walker to be D/C'd Home per MD order.  AVS completed. Patient/caregiver able to verbalize understanding.  Discharge Medication: Allergies as of 03/25/2022       Reactions   Nsaids Other (See Comments)   Per nephrologist        Medication List     STOP taking these medications    atenolol 50 MG tablet Commonly known as: TENORMIN   vitamin C 1000 MG tablet   ZINC PO       TAKE these medications    acetaminophen 500 MG tablet Commonly known as: TYLENOL Take 500 mg by mouth every 6 (six) hours as needed for mild pain.   albuterol 108 (90 Base) MCG/ACT inhaler Commonly known as: VENTOLIN HFA Inhale 1-2 puffs into the lungs every 4 (four) hours as needed for wheezing or shortness of breath.   apixaban 5 MG Tabs tablet Commonly known as: ELIQUIS Take 1 tablet (5 mg total) by mouth 2 (two) times daily.   artificial tears ointment Place 2 drops into both eyes 3 (three) times daily.   aspirin EC 81 MG tablet Take 1 tablet (81 mg total) by mouth daily. Swallow whole.   atorvastatin 40 MG tablet Commonly known as: LIPITOR Take 1 tablet (40 mg total) by mouth daily at 6 PM.   B-D ULTRAFINE III SHORT PEN 31G X 8 MM Misc Generic drug: Insulin Pen Needle Inject 1 Syringe into the skin daily.   enalapril 2.5 MG tablet Commonly known as: VASOTEC Take 1 tablet (2.5 mg total) by mouth daily. What changed:  medication strength how much to take   fish oil-omega-3 fatty acids 1000 MG capsule Take 1 g by mouth daily.   fluticasone 50 MCG/ACT nasal spray Commonly known as: FLONASE Place 1 spray into both nostrils daily.   HAIR/SKIN/NAILS PO Take 1 tablet by mouth daily.   isosorbide mononitrate 30 MG 24 hr tablet Commonly known as: IMDUR Take 0.5 tablets (15 mg total) by mouth daily.   metoprolol tartrate 25 MG tablet Commonly known as: LOPRESSOR Take 1 tablet (25 mg total) by  mouth 2 (two) times daily.   milk thistle 175 MG tablet Take 175 mg by mouth daily.   nitroGLYCERIN 0.4 MG SL tablet Commonly known as: NITROSTAT Place 1 tablet (0.4 mg total) under the tongue every 5 (five) minutes x 3 doses as needed for up to 3 days for chest pain.   ONE TOUCH ULTRA 2 w/Device Kit   Tyler Aas FlexTouch 200 UNIT/ML FlexTouch Pen Generic drug: insulin degludec Inject 18 Units into the skin at bedtime.   VITAMIN B 12 PO Take 1 tablet by mouth daily.   Vitamin D3 1.25 MG (50000 UT) Caps Take 1 capsule by mouth daily.        Discharge Assessment: Vitals:   03/24/22 2119 03/25/22 0405  BP: (!) 119/54 (!) 134/48  Pulse: 71 66  Resp: 18 19  Temp: 98 F (36.7 C) (!) 96.7 F (35.9 C)  SpO2: 98% 98%   Skin clean, dry and intact without evidence of skin break down, no evidence of skin tears noted. IV catheter discontinued intact. Site without signs and symptoms of complications - no redness or edema noted at insertion site, patient denies c/o pain - only slight tenderness at site.  Dressing with slight pressure applied.  D/c Instructions-Education: Discharge instructions given to patient/family with verbalized understanding. D/c education completed with patient/family including follow  up instructions, medication list, d/c activities limitations if indicated, with other d/c instructions as indicated by MD - patient able to verbalize understanding, all questions fully answered. Patient instructed to return to ED, call 911, or call MD for any changes in condition.  Patient escorted via [place], and D/C home via private auto.  Tsosie Billing, RN 03/25/2022 10:13 AM  Ng Discharge Note  Admit Date:  03/21/2022 Discharge date: 03/25/2022   Audrey Walker to be D/C'd Home per MD order.  AVS completed. Patient/caregiver able to verbalize understanding.  Discharge Medication: Allergies as of 03/25/2022       Reactions   Nsaids Other (See Comments)   Per  nephrologist        Medication List     STOP taking these medications    atenolol 50 MG tablet Commonly known as: TENORMIN   vitamin C 1000 MG tablet   ZINC PO       TAKE these medications    acetaminophen 500 MG tablet Commonly known as: TYLENOL Take 500 mg by mouth every 6 (six) hours as needed for mild pain.   albuterol 108 (90 Base) MCG/ACT inhaler Commonly known as: VENTOLIN HFA Inhale 1-2 puffs into the lungs every 4 (four) hours as needed for wheezing or shortness of breath.   apixaban 5 MG Tabs tablet Commonly known as: ELIQUIS Take 1 tablet (5 mg total) by mouth 2 (two) times daily.   artificial tears ointment Place 2 drops into both eyes 3 (three) times daily.   aspirin EC 81 MG tablet Take 1 tablet (81 mg total) by mouth daily. Swallow whole.   atorvastatin 40 MG tablet Commonly known as: LIPITOR Take 1 tablet (40 mg total) by mouth daily at 6 PM.   B-D ULTRAFINE III SHORT PEN 31G X 8 MM Misc Generic drug: Insulin Pen Needle Inject 1 Syringe into the skin daily.   enalapril 2.5 MG tablet Commonly known as: VASOTEC Take 1 tablet (2.5 mg total) by mouth daily. What changed:  medication strength how much to take   fish oil-omega-3 fatty acids 1000 MG capsule Take 1 g by mouth daily.   fluticasone 50 MCG/ACT nasal spray Commonly known as: FLONASE Place 1 spray into both nostrils daily.   HAIR/SKIN/NAILS PO Take 1 tablet by mouth daily.   isosorbide mononitrate 30 MG 24 hr tablet Commonly known as: IMDUR Take 0.5 tablets (15 mg total) by mouth daily.   metoprolol tartrate 25 MG tablet Commonly known as: LOPRESSOR Take 1 tablet (25 mg total) by mouth 2 (two) times daily.   milk thistle 175 MG tablet Take 175 mg by mouth daily.   nitroGLYCERIN 0.4 MG SL tablet Commonly known as: NITROSTAT Place 1 tablet (0.4 mg total) under the tongue every 5 (five) minutes x 3 doses as needed for up to 3 days for chest pain.   ONE TOUCH ULTRA 2  w/Device Kit   Tyler Aas FlexTouch 200 UNIT/ML FlexTouch Pen Generic drug: insulin degludec Inject 18 Units into the skin at bedtime.   VITAMIN B 12 PO Take 1 tablet by mouth daily.   Vitamin D3 1.25 MG (50000 UT) Caps Take 1 capsule by mouth daily.        Discharge Assessment: Vitals:   03/24/22 2119 03/25/22 0405  BP: (!) 119/54 (!) 134/48  Pulse: 71 66  Resp: 18 19  Temp: 98 F (36.7 C) (!) 96.7 F (35.9 C)  SpO2: 98% 98%   Skin clean, dry and intact without evidence  of skin break down, no evidence of skin tears noted. IV catheter discontinued intact. Site without signs and symptoms of complications - no redness or edema noted at insertion site, patient denies c/o pain - only slight tenderness at site.  Dressing with slight pressure applied.  D/c Instructions-Education: Discharge instructions given to patient/family with verbalized understanding. D/c education completed with patient/family including follow up instructions, medication list, d/c activities limitations if indicated, with other d/c instructions as indicated by MD - patient able to verbalize understanding, all questions fully answered. Patient instructed to return to ED, call 911, or call MD for any changes in condition.  Patient escorted via [place], and D/C home via private auto.  Tsosie Billing, LPN 36/85/9923 41:44 AM

## 2022-03-25 NOTE — Care Management Important Message (Signed)
Important Message  Patient Details  Name: Audrey Walker MRN: [id number] Date of Birth: [birth year]   Medicare Important Message Given:  Yes     Tommy Medal 03/25/2022, 9:30 AM

## 2022-03-25 NOTE — Progress Notes (Signed)
Patient ambulated from chair to bed, where she slept on and off this shift. The patient did wake and ambulate twice with standby assist to the restroom. No complaints of pain this shift. Continued to monitor patient.

## 2022-03-31 DIAGNOSIS — I129 Hypertensive chronic kidney disease with stage 1 through stage 4 chronic kidney disease, or unspecified chronic kidney disease: Secondary | ICD-10-CM | POA: Diagnosis not present

## 2022-03-31 DIAGNOSIS — I214 Non-ST elevation (NSTEMI) myocardial infarction: Secondary | ICD-10-CM | POA: Diagnosis not present

## 2022-03-31 DIAGNOSIS — D631 Anemia in chronic kidney disease: Secondary | ICD-10-CM | POA: Diagnosis not present

## 2022-03-31 DIAGNOSIS — Z794 Long term (current) use of insulin: Secondary | ICD-10-CM | POA: Diagnosis not present

## 2022-03-31 DIAGNOSIS — E1122 Type 2 diabetes mellitus with diabetic chronic kidney disease: Secondary | ICD-10-CM | POA: Diagnosis not present

## 2022-03-31 DIAGNOSIS — N183 Chronic kidney disease, stage 3 unspecified: Secondary | ICD-10-CM | POA: Diagnosis not present

## 2022-03-31 DIAGNOSIS — I480 Paroxysmal atrial fibrillation: Secondary | ICD-10-CM | POA: Diagnosis not present

## 2022-03-31 DIAGNOSIS — Z7901 Long term (current) use of anticoagulants: Secondary | ICD-10-CM | POA: Diagnosis not present

## 2022-03-31 DIAGNOSIS — Z7902 Long term (current) use of antithrombotics/antiplatelets: Secondary | ICD-10-CM | POA: Diagnosis not present

## 2022-04-01 DIAGNOSIS — I2510 Atherosclerotic heart disease of native coronary artery without angina pectoris: Secondary | ICD-10-CM | POA: Diagnosis not present

## 2022-04-06 DIAGNOSIS — E11319 Type 2 diabetes mellitus with unspecified diabetic retinopathy without macular edema: Secondary | ICD-10-CM | POA: Diagnosis not present

## 2022-04-06 DIAGNOSIS — Z961 Presence of intraocular lens: Secondary | ICD-10-CM | POA: Diagnosis not present

## 2022-04-06 DIAGNOSIS — H18513 Endothelial corneal dystrophy, bilateral: Secondary | ICD-10-CM | POA: Diagnosis not present

## 2022-04-08 DIAGNOSIS — E1122 Type 2 diabetes mellitus with diabetic chronic kidney disease: Secondary | ICD-10-CM | POA: Diagnosis not present

## 2022-04-11 ENCOUNTER — Ambulatory Visit: Payer: Medicare HMO | Attending: Cardiology | Admitting: Cardiology

## 2022-04-11 ENCOUNTER — Encounter: Payer: Self-pay | Admitting: Cardiology

## 2022-04-11 VITALS — BP 144/68 | HR 91 | Ht 66.5 in | Wt 186.0 lb

## 2022-04-11 DIAGNOSIS — E782 Mixed hyperlipidemia: Secondary | ICD-10-CM | POA: Diagnosis not present

## 2022-04-11 DIAGNOSIS — I10 Essential (primary) hypertension: Secondary | ICD-10-CM | POA: Diagnosis not present

## 2022-04-11 DIAGNOSIS — I214 Non-ST elevation (NSTEMI) myocardial infarction: Secondary | ICD-10-CM

## 2022-04-11 DIAGNOSIS — I4892 Unspecified atrial flutter: Secondary | ICD-10-CM

## 2022-04-11 MED ORDER — ATORVASTATIN CALCIUM 40 MG PO TABS: 40.0000 mg | ORAL_TABLET | Freq: Every day | ORAL | 3 refills | Status: DC

## 2022-04-11 MED ORDER — ISOSORBIDE MONONITRATE ER 30 MG PO TB24: 15.0000 mg | ORAL_TABLET | Freq: Every day | ORAL | 3 refills | Status: DC

## 2022-04-11 MED ORDER — APIXABAN 5 MG PO TABS: 5.0000 mg | ORAL_TABLET | Freq: Two times a day (BID) | ORAL | 3 refills | Status: DC

## 2022-04-11 MED ORDER — METOPROLOL TARTRATE 25 MG PO TABS: 25.0000 mg | ORAL_TABLET | Freq: Two times a day (BID) | ORAL | 3 refills | Status: DC

## 2022-04-11 NOTE — Patient Instructions (Signed)
Medication Instructions:  ?Your physician recommends that you continue on your current medications as directed. Please refer to the Current Medication list given to you today. ? ? ?Labwork: ?None today ? ?Testing/Procedures: ?None today ? ?Follow-Up: ?3 months ? ?Any Other Special Instructions Will Be Listed Below (If Applicable). ? ?If you need a refill on your cardiac medications before your next appointment, please call your pharmacy. ? ?

## 2022-04-11 NOTE — Progress Notes (Signed)
Cardiology Office Note  Date: 04/11/2022   ID: Audrey Walker, DOB Oct 30, 1932, MRN [id number]  PCP:  Celene Squibb, MD  Cardiologist:  Rozann Lesches, MD Electrophysiologist:  None   Chief Complaint  Patient presents with   Hospitalization Follow-up    History of Present Illness: Audrey Walker is an 88 y.o. female last seen in June by [name] PA-C.  She was recently hospitalized with chest discomfort and mild enzymatic evidence of NSTEMI with peak high-sensitivity troponin I of only 218.  ECG showed no acute ST segment changes and follow-up echocardiogram reviewed demonstrated normal LVEF at 60 to 65% without new regional wall motion abnormalities.  Medical therapy was recommended per discussion with cardiology and outpatient follow-up was arranged.  She is here today with her daughter for a follow-up visit.  She reports no recurrent chest pain, no sense of palpitations.  I reviewed her current medications as noted below.  She has not resumed enalapril, has home health in place to check blood pressure in the short-term. She was started on Eliquis 5 mg twice daily at recent hospital stay with history of paroxysmal atrial flutter.  CHA2DS2-VASc score is 7.  She had consistently declined anticoagulation prior to this.  For now she continues on low-dose aspirin.  She is in the process of being evaluated for eye surgery, has previous history of corneal transplant and will need some type of intervention at a facility in [place], details not clear but it sounds like she will just have mild sedation.  Unknown whether she will need to come off anticoagulation or not.  Past Medical History:  Diagnosis Date   Atrial flutter (HCC)    CKD (chronic kidney disease) stage 2, GFR 60-89 ml/min    Essential hypertension, benign    Type 2 diabetes mellitus (HCC)     Past Surgical History:  Procedure Laterality Date   APPENDECTOMY     CORNEAL TRANSPLANT Right    EYE SURGERY     GALLBLADDER SURGERY       Current Outpatient Medications  Medication Sig Dispense Refill   acetaminophen (TYLENOL) 500 MG tablet Take 500 mg by mouth every 6 (six) hours as needed for mild pain.     albuterol (VENTOLIN HFA) 108 (90 Base) MCG/ACT inhaler Inhale 1-2 puffs into the lungs every 4 (four) hours as needed for wheezing or shortness of breath.     aspirin EC 81 MG tablet Take 1 tablet (81 mg total) by mouth daily. Swallow whole. 30 tablet 12   B-D ULTRAFINE III SHORT PEN 31G X 8 MM MISC Inject 1 Syringe into the skin daily.     Biotin w/ Vitamins C & E (HAIR/SKIN/NAILS PO) Take 1 tablet by mouth daily.     Blood Glucose Monitoring Suppl (ONE TOUCH ULTRA 2) w/Device KIT      Cholecalciferol (VITAMIN D3) 1.25 MG (50000 UT) CAPS Take 1 capsule by mouth daily.     Cyanocobalamin (VITAMIN B 12 PO) Take 1 tablet by mouth daily.      fish oil-omega-3 fatty acids 1000 MG capsule Take 1 g by mouth daily.     fluticasone (FLONASE) 50 MCG/ACT nasal spray Place 1 spray into both nostrils daily. 15.8 mL 2   milk thistle 175 MG tablet Take 175 mg by mouth daily.     nitroGLYCERIN (NITROSTAT) 0.4 MG SL tablet Place 1 tablet (0.4 mg total) under the tongue every 5 (five) minutes x 3 doses as needed for up to 3 days  for chest pain. 9 tablet 12   TRESIBA FLEXTOUCH 200 UNIT/ML FlexTouch Pen Inject 18 Units into the skin at bedtime.     White Petrolatum-Mineral Oil (ARTIFICIAL TEARS) ointment Place 2 drops into both eyes 3 (three) times daily.     apixaban (ELIQUIS) 5 MG TABS tablet Take 1 tablet (5 mg total) by mouth 2 (two) times daily. 180 tablet 3   atorvastatin (LIPITOR) 40 MG tablet Take 1 tablet (40 mg total) by mouth daily at 6 PM. 90 tablet 3   isosorbide mononitrate (IMDUR) 30 MG 24 hr tablet Take 0.5 tablets (15 mg total) by mouth daily. 45 tablet 3   metoprolol tartrate (LOPRESSOR) 25 MG tablet Take 1 tablet (25 mg total) by mouth 2 (two) times daily. 180 tablet 3   No current facility-administered medications for  this visit.   Allergies:  Nsaids   ROS: No syncope.  Physical Exam: VS:  BP (!) 144/68   Pulse 91   Ht 5' 6.5" (1.689 m)   Wt 186 lb (84.4 kg)   SpO2 96%   BMI 29.57 kg/m , BMI Body mass index is 29.57 kg/m.  Wt Readings from Last 3 Encounters:  04/11/22 186 lb (84.4 kg)  03/22/22 186 lb 4.6 oz (84.5 kg)  11/22/21 182 lb (82.6 kg)    General: Patient appears comfortable at rest. Neck: Supple, no elevated JVP or carotid bruits. Lungs: Clear to auscultation, nonlabored breathing at rest. Cardiac: Regular rate and rhythm, no S3, 2/6 systolic murmur, no pericardial rub. Abdomen: Soft, bowel sounds present. Extremities: No pitting edema.  ECG:  An ECG dated 03/21/2022 was personally reviewed today and demonstrated:  Sinus rhythm with lead artifact.  Recent Labwork: 02/07/2022: B Natriuretic Peptide 136.0 03/21/2022: ALT 35; AST 40 03/22/2022: TSH 0.904 03/24/2022: BUN 34; Creatinine, Ser 1.29; Potassium 4.0; Sodium 140 03/25/2022: Hemoglobin 11.3; Platelets 152     Component Value Date/Time   CHOL 171 03/22/2022 0508   TRIG 48 03/22/2022 0508   HDL 67 03/22/2022 0508   CHOLHDL 2.6 03/22/2022 0508   VLDL 10 03/22/2022 0508   LDLCALC 94 03/22/2022 0508    Other Studies Reviewed Today:  Chest x-ray 03/21/2022: IMPRESSION: No evidence of active disease.  Echocardiogram 03/22/2022:  1. Left ventricular ejection fraction, by estimation, is 60 to 65%. The  left ventricle has normal function. The left ventricle has no regional  wall motion abnormalities. Left ventricular diastolic parameters are  consistent with Grade I diastolic  dysfunction (impaired relaxation).   2. Right ventricular systolic function is normal. The right ventricular  size is normal. There is normal pulmonary artery systolic pressure. The  estimated right ventricular systolic pressure is 20.3 mmHg.   3. Left atrial size was mildly dilated.   4. The mitral valve is normal in structure. Trivial mitral  valve  regurgitation. No evidence of mitral stenosis.   5. Tricuspid valve regurgitation is moderate.   6. The aortic valve is tricuspid. Aortic valve regurgitation is not  visualized. No aortic stenosis is present.   7. Pulmonic valve regurgitation is moderate.   8. The inferior vena cava is normal in size with greater than 50%  respiratory variability, suggesting right atrial pressure of 3 mmHg.   Assessment and Plan:  1.  Status post NSTEMI earlier in the month with mild high-sensitivity troponin I elevation to 218 at peak.  LVEF remains normal at 60 to 65% without regional wall motion abnormalities.  She reports no recurrent symptoms.  Would agree with  plans for continued medical therapy.  For now we will keep her on low-dose aspirin although anticipate stopping this ultimately since she is on Eliquis.  Continue Lopressor, Imdur, Lipitor, and as needed nitroglycerin.  Refills provided.  2.  History of paroxysmal atrial flutter, heart rate regular today.  CHA2DS2-VASc score is 7 and she has agreed to Eliquis which she continues at 5 mg twice daily.  No spontaneous bleeding problems.  She will have interval lab work with Dr. Nevada Crane in December.  3.  LDL 94 recently, started on Lipitor.  Plan to recheck lipids in the next 6 months.  4.  Essential hypertension.  Enalapril discontinued since hospital stay.  She will have blood pressure checked at home by home health and follow-up results.  May need to resume prior dosing.  Medication Adjustments/Labs and Tests Ordered: Current medicines are reviewed at length with the patient today.  Concerns regarding medicines are outlined above.   Tests Ordered: No orders of the defined types were placed in this encounter.   Medication Changes: Meds ordered this encounter  Medications   apixaban (ELIQUIS) 5 MG TABS tablet    Sig: Take 1 tablet (5 mg total) by mouth 2 (two) times daily.    Dispense:  180 tablet    Refill:  3   metoprolol tartrate  (LOPRESSOR) 25 MG tablet    Sig: Take 1 tablet (25 mg total) by mouth 2 (two) times daily.    Dispense:  180 tablet    Refill:  3   isosorbide mononitrate (IMDUR) 30 MG 24 hr tablet    Sig: Take 0.5 tablets (15 mg total) by mouth daily.    Dispense:  45 tablet    Refill:  3   atorvastatin (LIPITOR) 40 MG tablet    Sig: Take 1 tablet (40 mg total) by mouth daily at 6 PM.    Dispense:  90 tablet    Refill:  3    Disposition:  Follow up  3 months.  Signed, Satira Sark, MD, [facility] 04/11/2022 3:12 PM    [facility] [address], [place], [place] [address] Phone: [phone]; Fax: [phone]

## 2022-04-13 ENCOUNTER — Ambulatory Visit: Payer: Medicare HMO | Admitting: Cardiology

## 2022-04-22 IMAGING — DX DG CERVICAL SPINE COMPLETE 4+V
5 series · 5 of 5 positions shown · non-contrast
Comparison: None.

CLINICAL DATA: Neck and upper back pain, chronic

EXAM:
CERVICAL SPINE - COMPLETE 4+ VIEW

[c-spine lat]
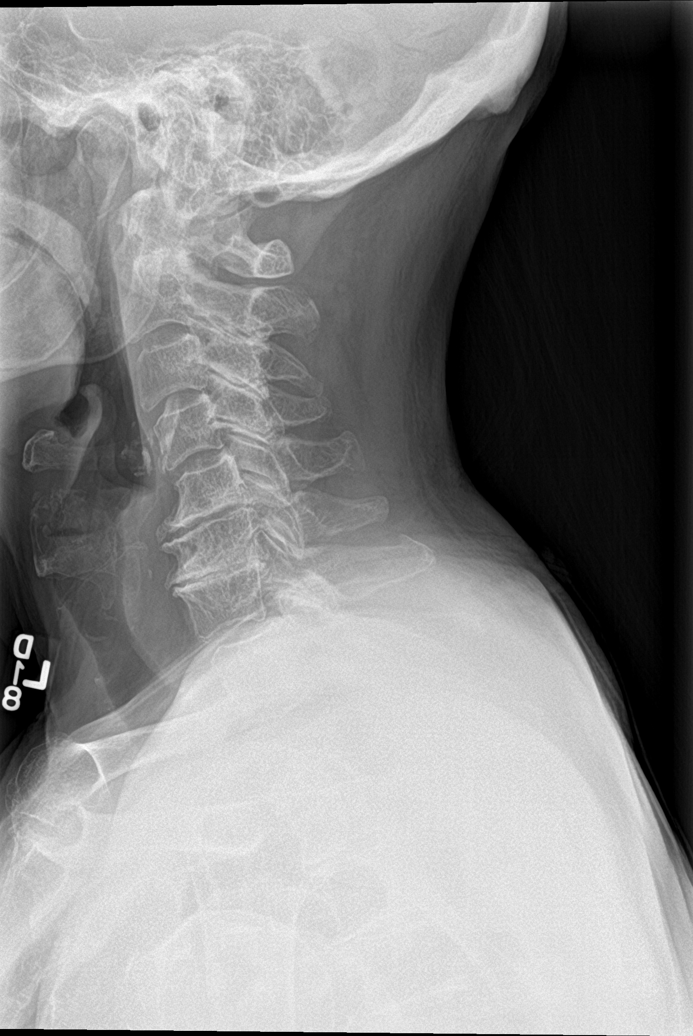

[c-spine obl (1 of 2)]
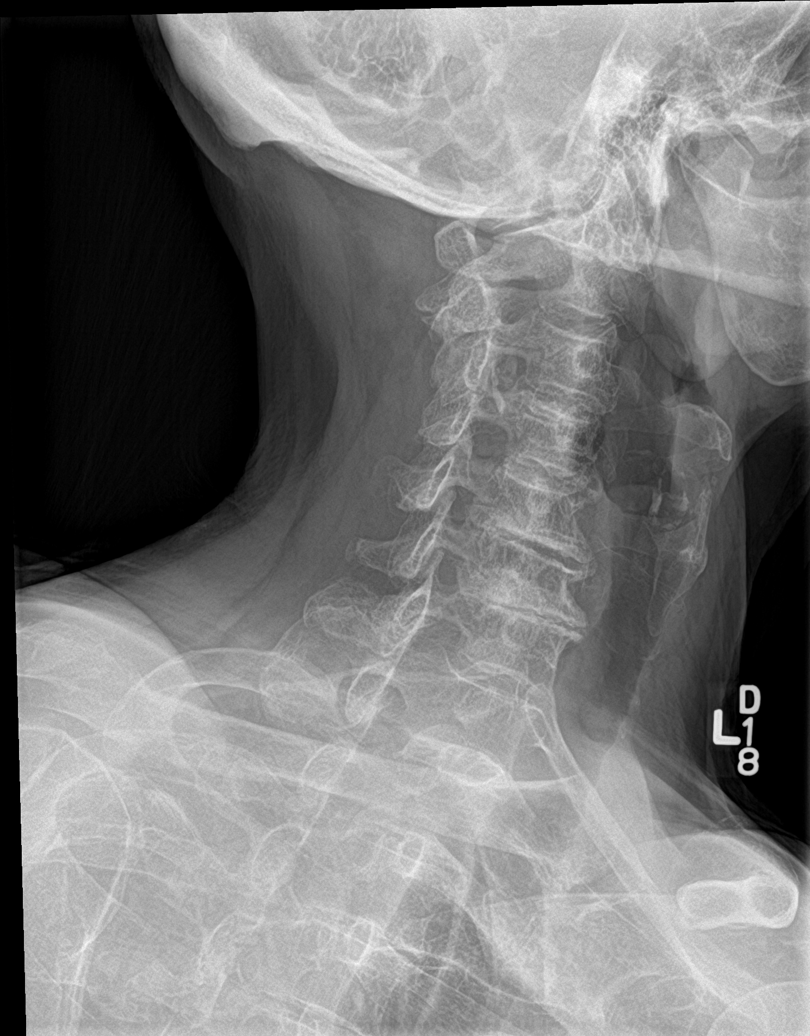

[c-spine obl (2 of 2)]
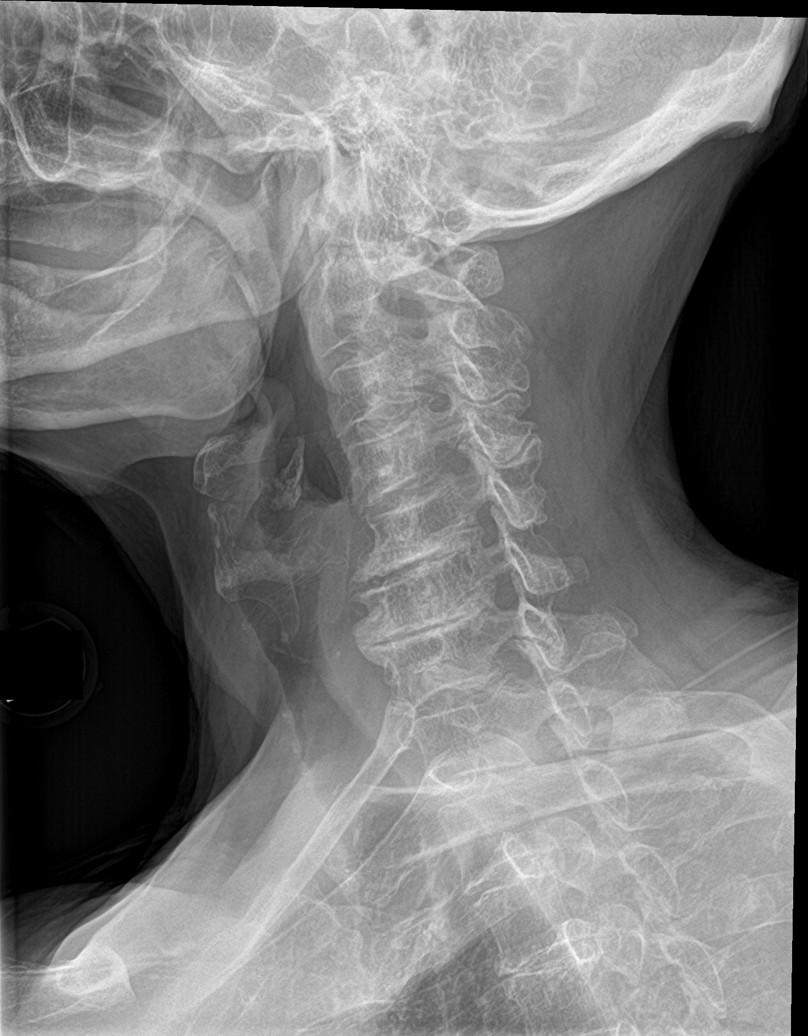

[c-spine ap]
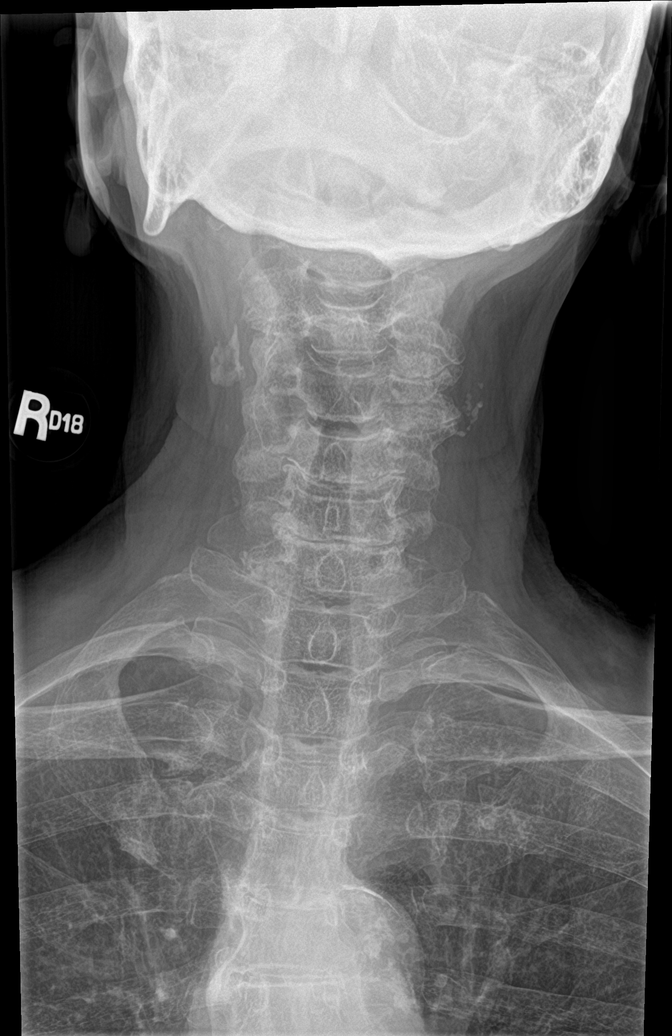

[c-spine open mouth]
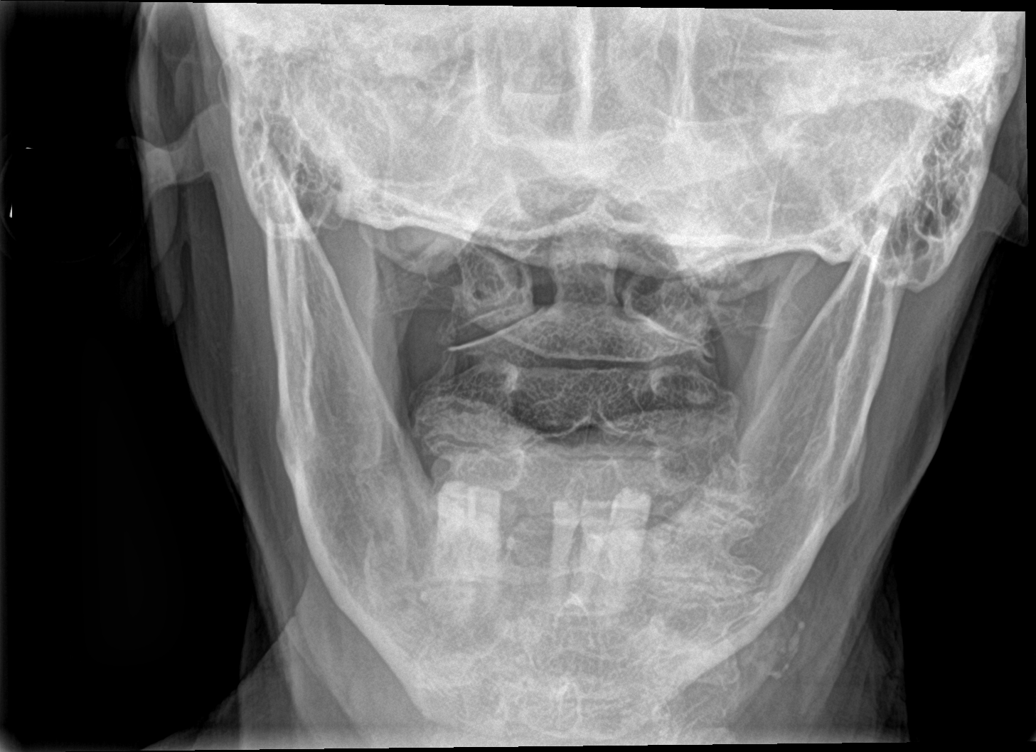

[5 of 5 positions shown; findings below may reference images not displayed]

FINDINGS: Frontal, bilateral oblique, lateral views of the cervical spine are
obtained. Alignment is anatomic to the cervicothoracic junction.
Severe spondylosis at C5-6 and C6-7. There is diffuse cervical
spondylosis greatest at C2-3 and C3-4. Left predominant neural
foraminal narrowing at C3-4, with symmetrical neural foraminal
narrowing at C5-6 and C6-7. No fractures. Soft tissues are
unremarkable. Lung apices are clear.
IMPRESSION: 1. Multilevel cervical spondylosis and facet hypertrophy, greatest
at C3-4, C5-6, and C6-7 with associated neural foraminal narrowing
as above.
2. No acute bony abnormality.

## 2022-04-22 IMAGING — DX DG THORACIC SPINE 3V
3 series · 3 of 3 positions shown · non-contrast
Comparison: None.

CLINICAL DATA: Neck and upper back pain, chronic

EXAM:
THORACIC SPINE - 3 VIEWS

[t-spine ap]
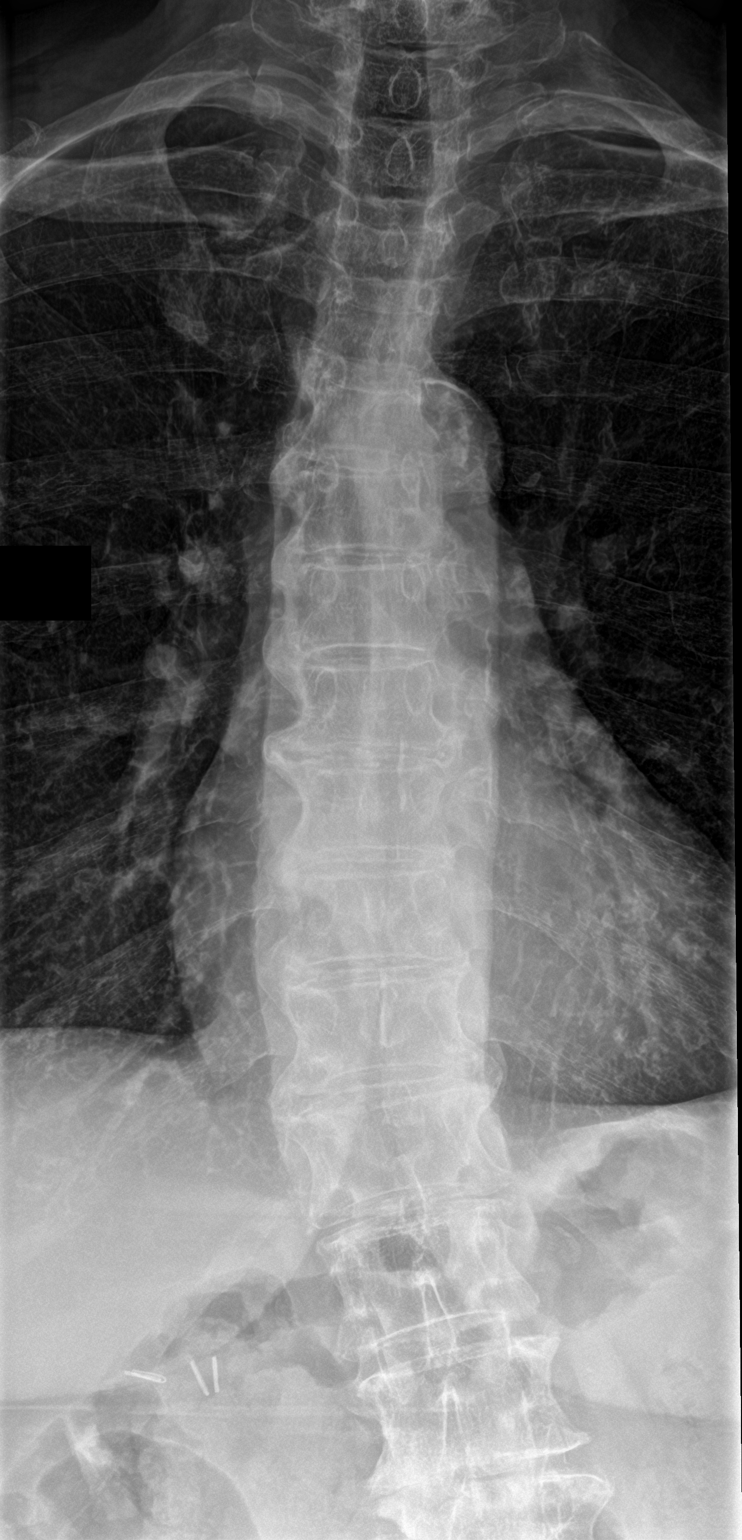

[t-spine lat]
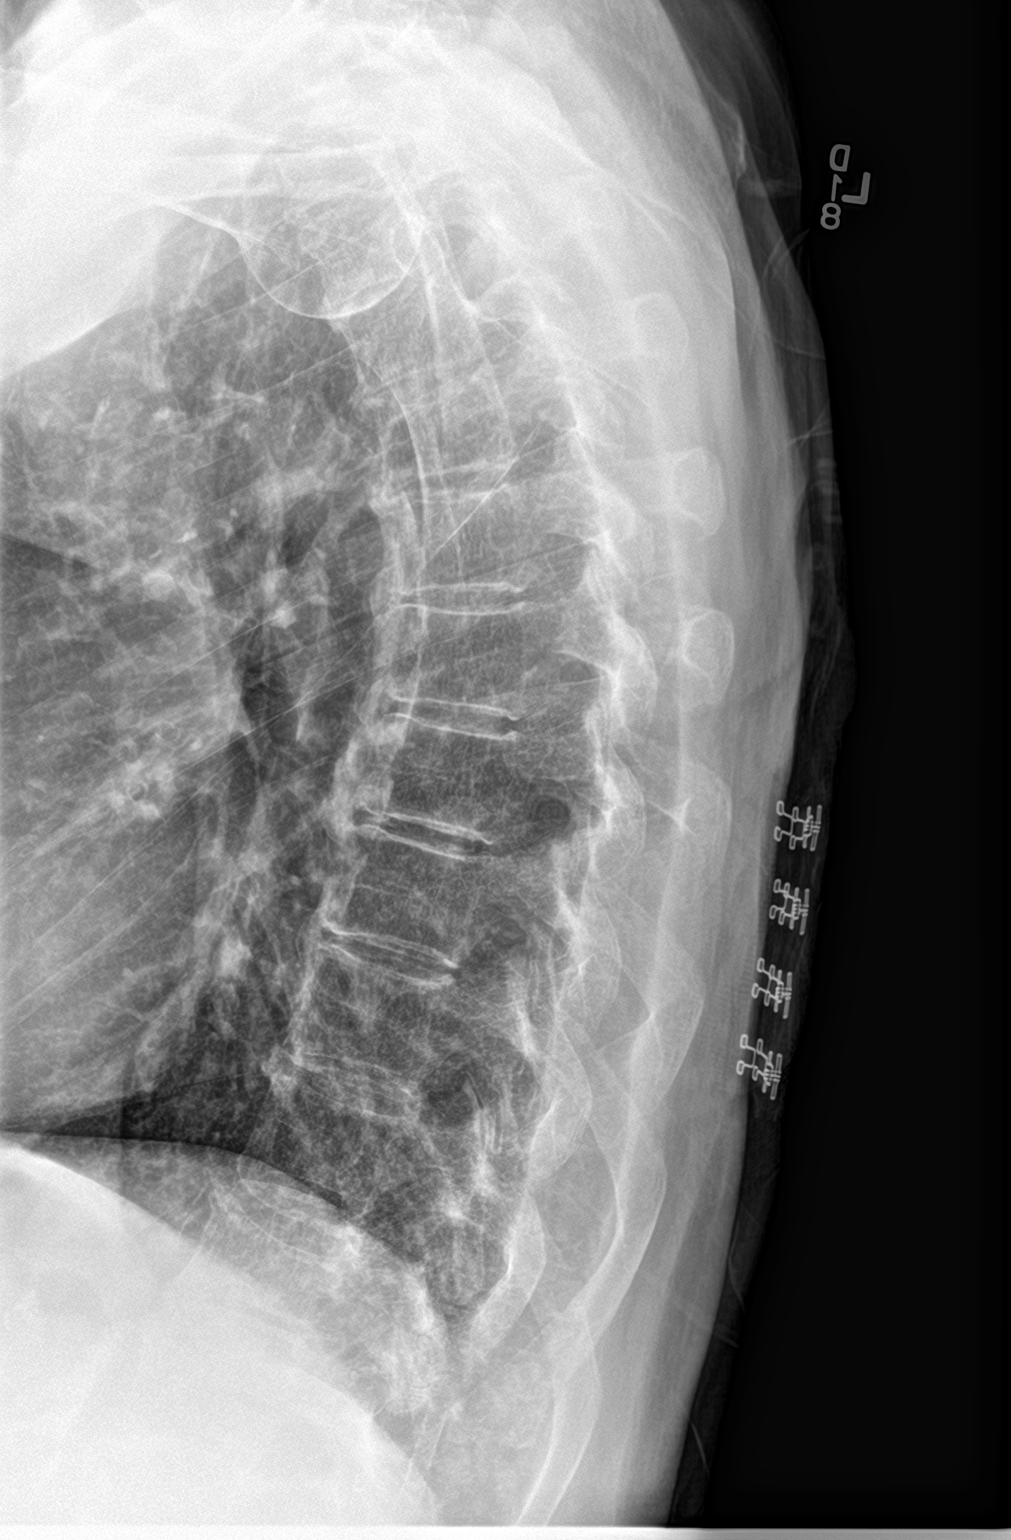

[t-spine swimmers]
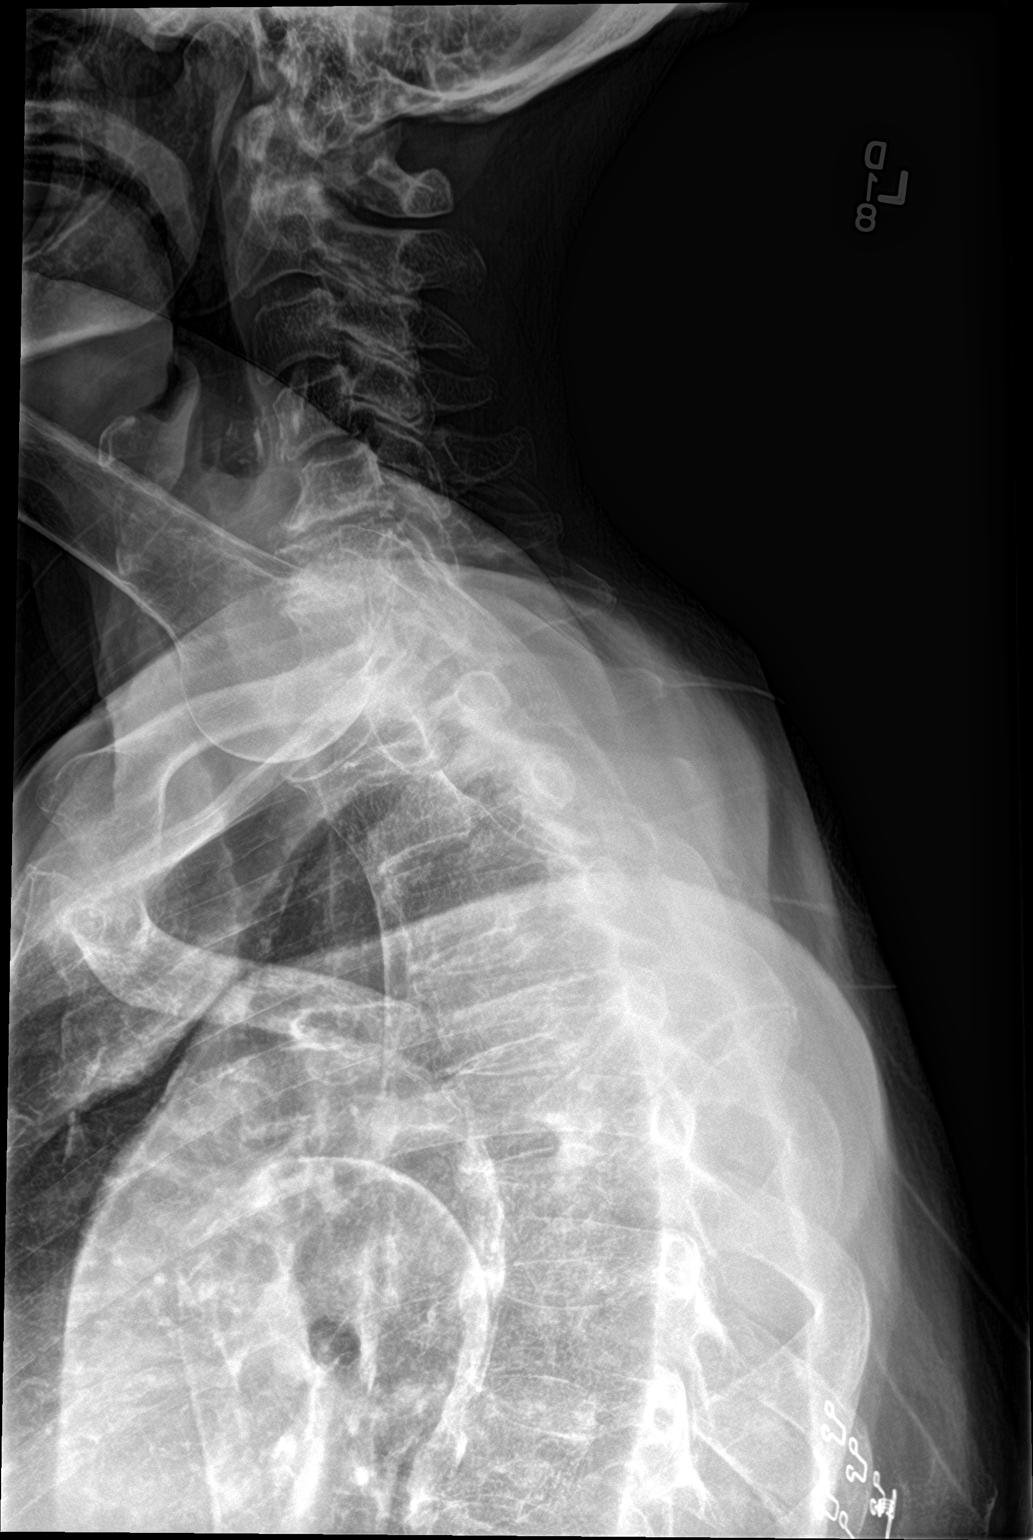

[3 of 3 positions shown; findings below may reference images not displayed]

FINDINGS: Frontal and lateral views of the thoracic spine are obtained. Mild
right convex scoliosis centered in the midthoracic spine. Otherwise
alignment is anatomic. No acute fractures. Mild diffuse spondylosis
and anterior osteophyte formation. Paraspinal soft tissues are
unremarkable.
IMPRESSION: 1. Mild diffuse spondylosis and right convex scoliosis. No acute
bony abnormality.

## 2022-05-09 DIAGNOSIS — E1122 Type 2 diabetes mellitus with diabetic chronic kidney disease: Secondary | ICD-10-CM | POA: Diagnosis not present

## 2022-05-11 ENCOUNTER — Telehealth: Payer: Self-pay | Admitting: *Deleted

## 2022-05-11 NOTE — Telephone Encounter (Signed)
   Pre-operative Risk Assessment    Patient Name: Audrey Walker  DOB: May 03, 1933 MRN: [id number]      Request for Surgical Clearance    Procedure:   CORNEAL SURGERY  Date of Surgery:  Clearance 07/07/22                                 Surgeon:  DR. Graylon Good Surgeon's Group or Practice Name:  [facility] Phone number:  [phone] Fax number:  [phone]   Type of Clearance Requested:   - Medical  - Pharmacy:  Hold Aspirin and Apixaban (Eliquis)   LEFT MESSAGE TO CONFIRM IF MEDS NEED TO BE HELD   Type of Anesthesia:  Local  & MAC   Additional requests/questions:    [name]   05/11/2022, 11:55 AM

## 2022-05-11 NOTE — Telephone Encounter (Signed)
S/p NSTEMI 03/2022 treated medically with Aspirin, Imdur, Metoprolol, Atorvastatin. On Eliquis due to atrial flutter.   Will await details whether required to hold OAC/ASA prior to addressing clearance. Will likely require phone visit. Will require MD input on ASA if needs to be held.   Loel Dubonnet, NP

## 2022-05-11 NOTE — Telephone Encounter (Signed)
     Primary Cardiologist: Rozann Lesches, MD  Chart reviewed as part of pre-operative protocol coverage. Given past medical history and time since last visit, based on ACC/AHA guidelines, Audrey Walker would be at acceptable risk for the planned procedure without further cardiovascular testing.    I will route this recommendation to the requesting party via Epic fax function and remove from pre-op pool.  Please call with questions.  Jossie Ng. Krosby Ritchie NP-C     05/11/2022, 2:10 PM [place] [address] Office [phone] Fax [phone]

## 2022-05-11 NOTE — Telephone Encounter (Signed)
ADDENDUM: SURGEON OFFICE CALLED BACK AND STATED NO MEDICATIONS ARE NEEDING TO BE HELD; INCLUDING THE ELIQUIS AND ASA

## 2022-05-12 DIAGNOSIS — L851 Acquired keratosis [keratoderma] palmaris et plantaris: Secondary | ICD-10-CM | POA: Diagnosis not present

## 2022-05-12 DIAGNOSIS — B351 Tinea unguium: Secondary | ICD-10-CM | POA: Diagnosis not present

## 2022-05-12 DIAGNOSIS — E1151 Type 2 diabetes mellitus with diabetic peripheral angiopathy without gangrene: Secondary | ICD-10-CM | POA: Diagnosis not present

## 2022-06-01 DIAGNOSIS — E1122 Type 2 diabetes mellitus with diabetic chronic kidney disease: Secondary | ICD-10-CM | POA: Diagnosis not present

## 2022-06-01 DIAGNOSIS — I10 Essential (primary) hypertension: Secondary | ICD-10-CM | POA: Diagnosis not present

## 2022-06-07 DIAGNOSIS — Z0001 Encounter for general adult medical examination with abnormal findings: Secondary | ICD-10-CM | POA: Diagnosis not present

## 2022-06-07 DIAGNOSIS — M542 Cervicalgia: Secondary | ICD-10-CM | POA: Diagnosis not present

## 2022-06-07 DIAGNOSIS — E875 Hyperkalemia: Secondary | ICD-10-CM | POA: Diagnosis not present

## 2022-06-07 DIAGNOSIS — E870 Hyperosmolality and hypernatremia: Secondary | ICD-10-CM | POA: Diagnosis not present

## 2022-06-07 DIAGNOSIS — M25561 Pain in right knee: Secondary | ICD-10-CM | POA: Diagnosis not present

## 2022-06-07 DIAGNOSIS — E785 Hyperlipidemia, unspecified: Secondary | ICD-10-CM | POA: Diagnosis not present

## 2022-06-07 DIAGNOSIS — E1122 Type 2 diabetes mellitus with diabetic chronic kidney disease: Secondary | ICD-10-CM | POA: Diagnosis not present

## 2022-06-07 DIAGNOSIS — N189 Chronic kidney disease, unspecified: Secondary | ICD-10-CM | POA: Diagnosis not present

## 2022-06-07 DIAGNOSIS — I129 Hypertensive chronic kidney disease with stage 1 through stage 4 chronic kidney disease, or unspecified chronic kidney disease: Secondary | ICD-10-CM | POA: Diagnosis not present

## 2022-06-07 DIAGNOSIS — I10 Essential (primary) hypertension: Secondary | ICD-10-CM | POA: Diagnosis not present

## 2022-06-07 DIAGNOSIS — I482 Chronic atrial fibrillation, unspecified: Secondary | ICD-10-CM | POA: Diagnosis not present

## 2022-06-07 DIAGNOSIS — R809 Proteinuria, unspecified: Secondary | ICD-10-CM | POA: Diagnosis not present

## 2022-06-30 DIAGNOSIS — E1122 Type 2 diabetes mellitus with diabetic chronic kidney disease: Secondary | ICD-10-CM | POA: Diagnosis not present

## 2022-07-07 DIAGNOSIS — Z7982 Long term (current) use of aspirin: Secondary | ICD-10-CM | POA: Diagnosis not present

## 2022-07-07 DIAGNOSIS — T868411 Corneal transplant failure, right eye: Secondary | ICD-10-CM | POA: Diagnosis not present

## 2022-07-07 DIAGNOSIS — H1811 Bullous keratopathy, right eye: Secondary | ICD-10-CM | POA: Diagnosis not present

## 2022-07-07 DIAGNOSIS — E785 Hyperlipidemia, unspecified: Secondary | ICD-10-CM | POA: Diagnosis not present

## 2022-07-07 DIAGNOSIS — Z7901 Long term (current) use of anticoagulants: Secondary | ICD-10-CM | POA: Diagnosis not present

## 2022-07-07 DIAGNOSIS — I252 Old myocardial infarction: Secondary | ICD-10-CM | POA: Diagnosis not present

## 2022-07-07 DIAGNOSIS — Z961 Presence of intraocular lens: Secondary | ICD-10-CM | POA: Diagnosis not present

## 2022-07-07 DIAGNOSIS — I2510 Atherosclerotic heart disease of native coronary artery without angina pectoris: Secondary | ICD-10-CM | POA: Diagnosis not present

## 2022-07-07 DIAGNOSIS — H18512 Endothelial corneal dystrophy, left eye: Secondary | ICD-10-CM | POA: Diagnosis not present

## 2022-07-07 DIAGNOSIS — E1122 Type 2 diabetes mellitus with diabetic chronic kidney disease: Secondary | ICD-10-CM | POA: Diagnosis not present

## 2022-07-07 DIAGNOSIS — I10 Essential (primary) hypertension: Secondary | ICD-10-CM | POA: Diagnosis not present

## 2022-07-07 DIAGNOSIS — N189 Chronic kidney disease, unspecified: Secondary | ICD-10-CM | POA: Diagnosis not present

## 2022-07-07 DIAGNOSIS — Y848 Other medical procedures as the cause of abnormal reaction of the patient, or of later complication, without mention of misadventure at the time of the procedure: Secondary | ICD-10-CM | POA: Diagnosis not present

## 2022-07-07 DIAGNOSIS — Z01818 Encounter for other preprocedural examination: Secondary | ICD-10-CM | POA: Diagnosis not present

## 2022-07-07 DIAGNOSIS — Z888 Allergy status to other drugs, medicaments and biological substances status: Secondary | ICD-10-CM | POA: Diagnosis not present

## 2022-07-07 DIAGNOSIS — T86891 Other transplanted tissue failure: Secondary | ICD-10-CM | POA: Diagnosis not present

## 2022-07-07 DIAGNOSIS — E11319 Type 2 diabetes mellitus with unspecified diabetic retinopathy without macular edema: Secondary | ICD-10-CM | POA: Diagnosis not present

## 2022-07-07 DIAGNOSIS — I129 Hypertensive chronic kidney disease with stage 1 through stage 4 chronic kidney disease, or unspecified chronic kidney disease: Secondary | ICD-10-CM | POA: Diagnosis not present

## 2022-07-11 ENCOUNTER — Telehealth: Payer: Self-pay | Admitting: Cardiology

## 2022-07-11 NOTE — Telephone Encounter (Signed)
Patient had eye surgery, and the dr notice tht she ws in afib. They advise her to get in contact with  dr, to let ehm know. Please advise

## 2022-07-13 NOTE — Progress Notes (Unsigned)
Cardiology Office Note  Date: 07/14/2022   ID: Audrey Walker, DOB April 13, 1933, MRN [id number]  PCP:  Celene Squibb, MD  Cardiologist:  Rozann Lesches, MD Electrophysiologist:  None   Chief Complaint  Patient presents with   Cardiac follow-up    History of Present Illness: Audrey Walker is an 89 y.o. female last seen in October 2023.  She is here today with her daughter for a follow-up visit.  Status post recent corneal surgery with follow-up pending.  They indicate that she did not have to stop either aspirin or Eliquis for the procedure.  She has had some recent trouble with dizziness and vestibular dysfunction.  Otherwise no palpitations or chest pain.  I reviewed her medications which are noted below.  We discussed stopping aspirin at this point with plan to continue Eliquis long-term, she has tolerated it well so far.  No interval nitroglycerin use reported.  I did review her most recent lab work from December 2023.  Her LDL was 41.  Past Medical History:  Diagnosis Date   Atrial flutter (HCC)    CKD (chronic kidney disease) stage 2, GFR 60-89 ml/min    Essential hypertension, benign    Type 2 diabetes mellitus (HCC)     Current Outpatient Medications  Medication Sig Dispense Refill   acetaminophen (TYLENOL) 500 MG tablet Take 500 mg by mouth every 6 (six) hours as needed for mild pain.     albuterol (VENTOLIN HFA) 108 (90 Base) MCG/ACT inhaler Inhale 1-2 puffs into the lungs every 4 (four) hours as needed for wheezing or shortness of breath.     apixaban (ELIQUIS) 5 MG TABS tablet Take 1 tablet (5 mg total) by mouth 2 (two) times daily. 180 tablet 3   atorvastatin (LIPITOR) 40 MG tablet Take 1 tablet (40 mg total) by mouth daily at 6 PM. 90 tablet 3   B-D ULTRAFINE III SHORT PEN 31G X 8 MM MISC Inject 1 Syringe into the skin daily.     Biotin w/ Vitamins C & E (HAIR/SKIN/NAILS PO) Take 1 tablet by mouth daily.     Blood Glucose Monitoring Suppl (ONE TOUCH ULTRA 2) w/Device  KIT      Cholecalciferol (VITAMIN D3) 1.25 MG (50000 UT) CAPS Take 1 capsule by mouth daily.     Cyanocobalamin (VITAMIN B 12 PO) Take 1 tablet by mouth daily.      fish oil-omega-3 fatty acids 1000 MG capsule Take 1 g by mouth daily.     fluticasone (FLONASE) 50 MCG/ACT nasal spray Place 1 spray into both nostrils daily. 15.8 mL 2   isosorbide mononitrate (IMDUR) 30 MG 24 hr tablet Take 0.5 tablets (15 mg total) by mouth daily. 45 tablet 3   metoprolol tartrate (LOPRESSOR) 25 MG tablet Take 1 tablet (25 mg total) by mouth 2 (two) times daily. 180 tablet 3   milk thistle 175 MG tablet Take 175 mg by mouth daily.     TRESIBA FLEXTOUCH 200 UNIT/ML FlexTouch Pen Inject 18 Units into the skin at bedtime.     White Petrolatum-Mineral Oil (ARTIFICIAL TEARS) ointment Place 2 drops into both eyes 3 (three) times daily.     nitroGLYCERIN (NITROSTAT) 0.4 MG SL tablet Place 1 tablet (0.4 mg total) under the tongue every 5 (five) minutes x 3 doses as needed for up to 3 days for chest pain. 9 tablet 12   No current facility-administered medications for this visit.   Allergies:  Nsaids   ROS: No orthopnea or  PND.  No syncope.  Physical Exam: VS:  BP 138/62   Pulse 72   Ht '5\' 6"'$  (1.676 m)   Wt 186 lb (84.4 kg)   SpO2 96%   BMI 30.02 kg/m , BMI Body mass index is 30.02 kg/m.  Wt Readings from Last 3 Encounters:  07/14/22 186 lb (84.4 kg)  04/11/22 186 lb (84.4 kg)  03/22/22 186 lb 4.6 oz (84.5 kg)    General: Patient appears comfortable at rest. HEENT: Conjunctiva and lids normal. Lungs: Clear to auscultation, nonlabored breathing at rest. Cardiac: Regular rate and rhythm, no S3, 2/6 systolic murmur.  ECG:  An ECG dated 03/21/2022 was personally reviewed today and demonstrated:  Sinus rhythm with lead artifact.  Recent Labwork: 02/07/2022: B Natriuretic Peptide 136.0 03/21/2022: ALT 35; AST 40 03/22/2022: TSH 0.904 03/24/2022: BUN 34; Creatinine, Ser 1.29; Potassium 4.0; Sodium  140 03/25/2022: Hemoglobin 11.3; Platelets 152     Component Value Date/Time   CHOL 171 03/22/2022 0508   TRIG 48 03/22/2022 0508   HDL 67 03/22/2022 0508   CHOLHDL 2.6 03/22/2022 0508   VLDL 10 03/22/2022 0508   LDLCALC 94 03/22/2022 0508  December 2023: Hemoglobin 12.2, platelets 171, BUN 31, creatinine 1.24, potassium 4.7, AST 38, ALT 29, cholesterol 120, triglycerides 59, HDL 66, LDL 41, hemoglobin A1c 6.6%  Other Studies Reviewed Today:  Echocardiogram 03/22/2022:  1. Left ventricular ejection fraction, by estimation, is 60 to 65%. The  left ventricle has normal function. The left ventricle has no regional  wall motion abnormalities. Left ventricular diastolic parameters are  consistent with Grade I diastolic  dysfunction (impaired relaxation).   2. Right ventricular systolic function is normal. The right ventricular  size is normal. There is normal pulmonary artery systolic pressure. The  estimated right ventricular systolic pressure is 13.2 mmHg.   3. Left atrial size was mildly dilated.   4. The mitral valve is normal in structure. Trivial mitral valve  regurgitation. No evidence of mitral stenosis.   5. Tricuspid valve regurgitation is moderate.   6. The aortic valve is tricuspid. Aortic valve regurgitation is not  visualized. No aortic stenosis is present.   7. Pulmonic valve regurgitation is moderate.   8. The inferior vena cava is normal in size with greater than 50%  respiratory variability, suggesting right atrial pressure of 3 mmHg.   Assessment and Plan:  1.  Paroxysmal atrial fibrillation/flutter with CHA2DS2-VASc score of 7.  She does not report any palpitations with intermittent arrhythmia.  Currently on Eliquis for stroke prophylaxis and Lopressor, heart rate in the 70s today.  I reviewed her interval lab work, she does not report any spontaneous bleeding problems.  2.  History of medically managed NSTEMI last year with no recurring angina symptoms.  Stopping  aspirin at this time given plan to continue Eliquis.  Continue Lopressor, Imdur, and Lipitor.  She has as needed nitroglycerin available.  3.  Mixed hyperlipidemia, on Lipitor.  Most recent LDL was down to 41.  Medication Adjustments/Labs and Tests Ordered: Current medicines are reviewed at length with the patient today.  Concerns regarding medicines are outlined above.   Tests Ordered: No orders of the defined types were placed in this encounter.   Medication Changes: No orders of the defined types were placed in this encounter.   Disposition:  Follow up  3 months.  Signed, Satira Sark, MD, [facility] 07/14/2022 1:12 PM    [facility] at [facility] [address],  [place], [place] [address] Phone: [phone]; Fax: [phone]

## 2022-07-14 ENCOUNTER — Ambulatory Visit: Payer: Medicare HMO | Attending: Cardiology | Admitting: Cardiology

## 2022-07-14 ENCOUNTER — Encounter: Payer: Self-pay | Admitting: Cardiology

## 2022-07-14 VITALS — BP 138/62 | HR 72 | Ht 66.0 in | Wt 186.0 lb

## 2022-07-14 DIAGNOSIS — E782 Mixed hyperlipidemia: Secondary | ICD-10-CM | POA: Diagnosis not present

## 2022-07-14 DIAGNOSIS — I259 Chronic ischemic heart disease, unspecified: Secondary | ICD-10-CM | POA: Diagnosis not present

## 2022-07-14 DIAGNOSIS — I4892 Unspecified atrial flutter: Secondary | ICD-10-CM

## 2022-07-14 NOTE — Patient Instructions (Signed)
Medication Instructions:  STOP Aspirin   Labwork: None today  Testing/Procedures: None today  Follow-Up: 3 months  Any Other Special Instructions Will Be Listed Below (If Applicable).  If you need a refill on your cardiac medications before your next appointment, please call your pharmacy.

## 2022-07-28 DIAGNOSIS — B351 Tinea unguium: Secondary | ICD-10-CM | POA: Diagnosis not present

## 2022-07-28 DIAGNOSIS — E1151 Type 2 diabetes mellitus with diabetic peripheral angiopathy without gangrene: Secondary | ICD-10-CM | POA: Diagnosis not present

## 2022-07-28 DIAGNOSIS — L851 Acquired keratosis [keratoderma] palmaris et plantaris: Secondary | ICD-10-CM | POA: Diagnosis not present

## 2022-09-14 DIAGNOSIS — I10 Essential (primary) hypertension: Secondary | ICD-10-CM | POA: Diagnosis not present

## 2022-09-14 DIAGNOSIS — E1122 Type 2 diabetes mellitus with diabetic chronic kidney disease: Secondary | ICD-10-CM | POA: Diagnosis not present

## 2022-09-19 DIAGNOSIS — Z0000 Encounter for general adult medical examination without abnormal findings: Secondary | ICD-10-CM | POA: Diagnosis not present

## 2022-09-20 DIAGNOSIS — N189 Chronic kidney disease, unspecified: Secondary | ICD-10-CM | POA: Diagnosis not present

## 2022-09-20 DIAGNOSIS — E875 Hyperkalemia: Secondary | ICD-10-CM | POA: Diagnosis not present

## 2022-09-20 DIAGNOSIS — M25561 Pain in right knee: Secondary | ICD-10-CM | POA: Diagnosis not present

## 2022-09-20 DIAGNOSIS — I10 Essential (primary) hypertension: Secondary | ICD-10-CM | POA: Diagnosis not present

## 2022-09-20 DIAGNOSIS — I482 Chronic atrial fibrillation, unspecified: Secondary | ICD-10-CM | POA: Diagnosis not present

## 2022-09-20 DIAGNOSIS — E870 Hyperosmolality and hypernatremia: Secondary | ICD-10-CM | POA: Diagnosis not present

## 2022-09-20 DIAGNOSIS — M542 Cervicalgia: Secondary | ICD-10-CM | POA: Diagnosis not present

## 2022-09-20 DIAGNOSIS — E1122 Type 2 diabetes mellitus with diabetic chronic kidney disease: Secondary | ICD-10-CM | POA: Diagnosis not present

## 2022-09-20 DIAGNOSIS — I129 Hypertensive chronic kidney disease with stage 1 through stage 4 chronic kidney disease, or unspecified chronic kidney disease: Secondary | ICD-10-CM | POA: Diagnosis not present

## 2022-09-20 DIAGNOSIS — E1140 Type 2 diabetes mellitus with diabetic neuropathy, unspecified: Secondary | ICD-10-CM | POA: Diagnosis not present

## 2022-09-20 DIAGNOSIS — E785 Hyperlipidemia, unspecified: Secondary | ICD-10-CM | POA: Diagnosis not present

## 2022-09-30 DIAGNOSIS — M25561 Pain in right knee: Secondary | ICD-10-CM | POA: Diagnosis not present

## 2022-10-03 DIAGNOSIS — I700 Atherosclerosis of aorta: Secondary | ICD-10-CM | POA: Diagnosis not present

## 2022-10-03 DIAGNOSIS — Z008 Encounter for other general examination: Secondary | ICD-10-CM | POA: Diagnosis not present

## 2022-10-03 DIAGNOSIS — I25119 Atherosclerotic heart disease of native coronary artery with unspecified angina pectoris: Secondary | ICD-10-CM | POA: Diagnosis not present

## 2022-10-03 DIAGNOSIS — I4891 Unspecified atrial fibrillation: Secondary | ICD-10-CM | POA: Diagnosis not present

## 2022-10-03 DIAGNOSIS — E785 Hyperlipidemia, unspecified: Secondary | ICD-10-CM | POA: Diagnosis not present

## 2022-10-03 DIAGNOSIS — N189 Chronic kidney disease, unspecified: Secondary | ICD-10-CM | POA: Diagnosis not present

## 2022-10-03 DIAGNOSIS — D6869 Other thrombophilia: Secondary | ICD-10-CM | POA: Diagnosis not present

## 2022-10-03 DIAGNOSIS — I129 Hypertensive chronic kidney disease with stage 1 through stage 4 chronic kidney disease, or unspecified chronic kidney disease: Secondary | ICD-10-CM | POA: Diagnosis not present

## 2022-10-03 DIAGNOSIS — E1122 Type 2 diabetes mellitus with diabetic chronic kidney disease: Secondary | ICD-10-CM | POA: Diagnosis not present

## 2022-10-03 DIAGNOSIS — M1990 Unspecified osteoarthritis, unspecified site: Secondary | ICD-10-CM | POA: Diagnosis not present

## 2022-10-03 DIAGNOSIS — I252 Old myocardial infarction: Secondary | ICD-10-CM | POA: Diagnosis not present

## 2022-10-03 DIAGNOSIS — I672 Cerebral atherosclerosis: Secondary | ICD-10-CM | POA: Diagnosis not present

## 2022-10-03 DIAGNOSIS — Z794 Long term (current) use of insulin: Secondary | ICD-10-CM | POA: Diagnosis not present

## 2022-10-13 DIAGNOSIS — L851 Acquired keratosis [keratoderma] palmaris et plantaris: Secondary | ICD-10-CM | POA: Diagnosis not present

## 2022-10-13 DIAGNOSIS — B351 Tinea unguium: Secondary | ICD-10-CM | POA: Diagnosis not present

## 2022-10-13 DIAGNOSIS — E1151 Type 2 diabetes mellitus with diabetic peripheral angiopathy without gangrene: Secondary | ICD-10-CM | POA: Diagnosis not present

## 2022-10-25 ENCOUNTER — Ambulatory Visit: Payer: Medicare HMO | Attending: Cardiology | Admitting: Cardiology

## 2022-10-25 ENCOUNTER — Encounter: Payer: Self-pay | Admitting: Cardiology

## 2022-10-25 VITALS — BP 140/80 | HR 93 | Ht 66.5 in | Wt 184.0 lb

## 2022-10-25 DIAGNOSIS — I259 Chronic ischemic heart disease, unspecified: Secondary | ICD-10-CM | POA: Diagnosis not present

## 2022-10-25 DIAGNOSIS — I4892 Unspecified atrial flutter: Secondary | ICD-10-CM

## 2022-10-25 NOTE — Patient Instructions (Signed)
Medication Instructions:  Your physician recommends that you continue on your current medications as directed. Please refer to the Current Medication list given to you today.   Labwork: None today  Testing/Procedures: None today  Follow-Up: 6 months  Any Other Special Instructions Will Be Listed Below (If Applicable).  If you need a refill on your cardiac medications before your next appointment, please call your pharmacy.  

## 2022-10-25 NOTE — Progress Notes (Signed)
Cardiology Office Note  Date: 10/25/2022   ID: Audrey Walker, DOB [birth year], MRN [id number]  History of Present Illness: Audrey Walker is an 89 y.o. female last seen in February.  She is here today with her daughter for a follow-up visit.  Reports no major change in status, occasional sense of palpitations but no chest discomfort with any regularity.  She did have an episode of "pressure" several weeks ago and took nitroglycerin, although this was not of much benefit.  We went over her medications, she does not report any spontaneous bleeding problems on Eliquis, nothing more than occasional bruising.  No changes in stools.  She reports compliance with therapy otherwise.  Physical Exam: VS:  BP (!) 140/80   Pulse 93   Ht 5' 6.5" (1.689 m)   Wt 184 lb (83.5 kg)   SpO2 96%   BMI 29.25 kg/m , BMI Body mass index is 29.25 kg/m.  Wt Readings from Last 3 Encounters:  10/25/22 184 lb (83.5 kg)  07/14/22 186 lb (84.4 kg)  04/11/22 186 lb (84.4 kg)    General: Patient appears comfortable at rest. HEENT: Conjunctiva and lids normal. Lungs: Clear to auscultation, nonlabored breathing at rest. Cardiac: Irregularly irregular, 2/6 systolic murmur. Extremities: No pitting edema.  ECG:  An ECG dated 03/21/2022 was personally reviewed today and demonstrated:  Sinus rhythm with lead artifact.  Labwork: 02/07/2022: B Natriuretic Peptide 136.0 03/21/2022: ALT 35; AST 40 03/22/2022: TSH 0.904 03/24/2022: BUN 34; Creatinine, Ser 1.29; Potassium 4.0; Sodium 140 03/25/2022: Hemoglobin 11.3; Platelets 152     Component Value Date/Time   CHOL 171 03/22/2022 0508   TRIG 48 03/22/2022 0508   HDL 67 03/22/2022 0508   CHOLHDL 2.6 03/22/2022 0508   VLDL 10 03/22/2022 0508   LDLCALC 94 03/22/2022 0508  April 2024: Hemoglobin 12.4, platelets 166, BUN 27, creatinine 1.19, potassium 4.7, AST 39, ALT 33, cholesterol 111, triglycerides 57, HDL 66, LDL 32, hemoglobin A1c 6.6%  Other Studies Reviewed  Today:  Echocardiogram 03/22/2022:  1. Left ventricular ejection fraction, by estimation, is 60 to 65%. The  left ventricle has normal function. The left ventricle has no regional  wall motion abnormalities. Left ventricular diastolic parameters are  consistent with Grade I diastolic  dysfunction (impaired relaxation).   2. Right ventricular systolic function is normal. The right ventricular  size is normal. There is normal pulmonary artery systolic pressure. The  estimated right ventricular systolic pressure is 29.0 mmHg.   3. Left atrial size was mildly dilated.   4. The mitral valve is normal in structure. Trivial mitral valve  regurgitation. No evidence of mitral stenosis.   5. Tricuspid valve regurgitation is moderate.   6. The aortic valve is tricuspid. Aortic valve regurgitation is not  visualized. No aortic stenosis is present.   7. Pulmonic valve regurgitation is moderate.   8. The inferior vena cava is normal in size with greater than 50%  respiratory variability, suggesting right atrial pressure of 3 mmHg.   Assessment and Plan:  1.  Paroxysmal atrial fibrillation/flutter with CHA2DS2-VASc score of 7.  She is in atrial fibrillation today on examination but asymptomatic and with reasonable heart rate in the 90s at rest on Lopressor.  Continue Eliquis for stroke prophylaxis as before.  I reviewed her recent lab work from April.  No changes in current plan.  2.  Mixed hyperlipidemia.  LDL 32 in April on Lipitor.  3.  Ischemic heart disease with medically managed NSTEMI in October 2023.  LVEF 60 to 65% without regional wall motion abnormalities at that time.  Currently on Lopressor, Imdur, Lipitor, and as needed nitroglycerin.  She is not on aspirin.  Disposition:  Follow up  6 months.  Signed, Jonelle Sidle, M.D., F.A.C.C. [facility] at [facility]

## 2022-11-29 DIAGNOSIS — Z947 Corneal transplant status: Secondary | ICD-10-CM | POA: Diagnosis not present

## 2022-12-09 ENCOUNTER — Ambulatory Visit: Payer: Medicare HMO | Admitting: Student

## 2022-12-29 DIAGNOSIS — B351 Tinea unguium: Secondary | ICD-10-CM | POA: Diagnosis not present

## 2022-12-29 DIAGNOSIS — L851 Acquired keratosis [keratoderma] palmaris et plantaris: Secondary | ICD-10-CM | POA: Diagnosis not present

## 2022-12-29 DIAGNOSIS — L600 Ingrowing nail: Secondary | ICD-10-CM | POA: Diagnosis not present

## 2022-12-29 DIAGNOSIS — E1142 Type 2 diabetes mellitus with diabetic polyneuropathy: Secondary | ICD-10-CM | POA: Diagnosis not present

## 2022-12-29 DIAGNOSIS — M79675 Pain in left toe(s): Secondary | ICD-10-CM | POA: Diagnosis not present

## 2023-01-31 DIAGNOSIS — Z947 Corneal transplant status: Secondary | ICD-10-CM | POA: Diagnosis not present

## 2023-02-14 DIAGNOSIS — I10 Essential (primary) hypertension: Secondary | ICD-10-CM | POA: Diagnosis not present

## 2023-02-14 DIAGNOSIS — E1122 Type 2 diabetes mellitus with diabetic chronic kidney disease: Secondary | ICD-10-CM | POA: Diagnosis not present

## 2023-02-21 DIAGNOSIS — M542 Cervicalgia: Secondary | ICD-10-CM | POA: Diagnosis not present

## 2023-02-21 DIAGNOSIS — E785 Hyperlipidemia, unspecified: Secondary | ICD-10-CM | POA: Diagnosis not present

## 2023-02-21 DIAGNOSIS — E1122 Type 2 diabetes mellitus with diabetic chronic kidney disease: Secondary | ICD-10-CM | POA: Diagnosis not present

## 2023-02-21 DIAGNOSIS — I10 Essential (primary) hypertension: Secondary | ICD-10-CM | POA: Diagnosis not present

## 2023-02-21 DIAGNOSIS — E870 Hyperosmolality and hypernatremia: Secondary | ICD-10-CM | POA: Diagnosis not present

## 2023-02-21 DIAGNOSIS — N189 Chronic kidney disease, unspecified: Secondary | ICD-10-CM | POA: Diagnosis not present

## 2023-02-21 DIAGNOSIS — I482 Chronic atrial fibrillation, unspecified: Secondary | ICD-10-CM | POA: Diagnosis not present

## 2023-02-21 DIAGNOSIS — E875 Hyperkalemia: Secondary | ICD-10-CM | POA: Diagnosis not present

## 2023-02-21 DIAGNOSIS — I129 Hypertensive chronic kidney disease with stage 1 through stage 4 chronic kidney disease, or unspecified chronic kidney disease: Secondary | ICD-10-CM | POA: Diagnosis not present

## 2023-02-28 DIAGNOSIS — Z947 Corneal transplant status: Secondary | ICD-10-CM | POA: Diagnosis not present

## 2023-03-07 ENCOUNTER — Encounter: Payer: Self-pay | Admitting: Medical

## 2023-03-07 ENCOUNTER — Ambulatory Visit: Payer: Medicare HMO | Attending: Medical | Admitting: Medical

## 2023-03-07 VITALS — BP 118/62 | HR 68 | Ht 66.0 in | Wt 182.2 lb

## 2023-03-07 DIAGNOSIS — H524 Presbyopia: Secondary | ICD-10-CM | POA: Diagnosis not present

## 2023-03-07 DIAGNOSIS — E782 Mixed hyperlipidemia: Secondary | ICD-10-CM

## 2023-03-07 DIAGNOSIS — I4892 Unspecified atrial flutter: Secondary | ICD-10-CM

## 2023-03-07 DIAGNOSIS — I2510 Atherosclerotic heart disease of native coronary artery without angina pectoris: Secondary | ICD-10-CM

## 2023-03-07 DIAGNOSIS — H52222 Regular astigmatism, left eye: Secondary | ICD-10-CM | POA: Diagnosis not present

## 2023-03-07 NOTE — Progress Notes (Signed)
Cardiology Office Note:    Date:  03/07/2023   ID:  Audrey Walker, DOB 05/15/1933, MRN [id number]  PCP:  Benita Stabile, MD  [facility] Cardiologist:  Nona Dell, MD  [facility] Electrophysiologist:  None   Referring MD: Benita Stabile, MD   Chief Complaint: 6 month follow-up  History of Present Illness:    Audrey Walker is a 89 y.o. female with a hx of paroxysmal A-fib/flutter on Eliquis, hyperlipidemia, CAD medically managed, diabetes type 2, CKD stage III who presents for follow-up.  Patient has a history of CAD with non-STEMI in October 2023 was managed medically.  Echo showed normal pump function without wall motion abnormalities.  Patient has a history of A-fib, however she was not on Eliquis until admission for non-STEMI.  She is on Lopressor for rate control.  Patient was last seen May 2024 and was overall stable from a cardiac perspective.  Today, the patient reports she is overall doing well. She denies chest pain or shortness of breath. She has chronic dependent lower leg edema. No orthopnea or pnd. She reports rare palpitations at night. No lightheadedness or dizziness. She lives by herself and can care for herself. She uses a cane or walker. She does not need refills. She denies bleeding issues. PCP did blood work last month.   Past Medical History:  Diagnosis Date   Atrial flutter (HCC)    CKD (chronic kidney disease) stage 2, GFR 60-89 ml/min    Essential hypertension, benign    Type 2 diabetes mellitus (HCC)     Past Surgical History:  Procedure Laterality Date   APPENDECTOMY     CORNEAL TRANSPLANT Right    EYE SURGERY     GALLBLADDER SURGERY      Current Medications: Current Meds  Medication Sig   acetaminophen (TYLENOL) 500 MG tablet Take 500 mg by mouth every 6 (six) hours as needed for mild pain.   albuterol (VENTOLIN HFA) 108 (90 Base) MCG/ACT inhaler Inhale 1-2 puffs into the lungs every 4 (four) hours as needed for wheezing or shortness of  breath.   apixaban (ELIQUIS) 5 MG TABS tablet Take 1 tablet (5 mg total) by mouth 2 (two) times daily.   atorvastatin (LIPITOR) 40 MG tablet Take 1 tablet (40 mg total) by mouth daily at 6 PM.   B-D ULTRAFINE III SHORT PEN 31G X 8 MM MISC Inject 1 Syringe into the skin daily.   Biotin w/ Vitamins C & E (HAIR/SKIN/NAILS PO) Take 1 tablet by mouth daily.   Blood Glucose Monitoring Suppl (ONE TOUCH ULTRA 2) w/Device KIT    Cholecalciferol (VITAMIN D3) 1.25 MG (50000 UT) CAPS Take 1 capsule by mouth daily.   Cyanocobalamin (VITAMIN B 12 PO) Take 1 tablet by mouth daily.    Difluprednate 0.05 % EMUL Place 1 drop into the right eye 3 (three) times daily.   fish oil-omega-3 fatty acids 1000 MG capsule Take 1 g by mouth daily.   fluticasone (FLONASE) 50 MCG/ACT nasal spray Place 1 spray into both nostrils daily.   isosorbide mononitrate (IMDUR) 30 MG 24 hr tablet Take 0.5 tablets (15 mg total) by mouth daily.   metoprolol tartrate (LOPRESSOR) 25 MG tablet Take 1 tablet (25 mg total) by mouth 2 (two) times daily.   milk thistle 175 MG tablet Take 175 mg by mouth daily.   TRESIBA FLEXTOUCH 200 UNIT/ML FlexTouch Pen Inject 18 Units into the skin at bedtime.   White Petrolatum-Mineral Oil (ARTIFICIAL TEARS) ointment Place  2 drops into both eyes 3 (three) times daily.     Allergies:   Nsaids   Social History   Socioeconomic History   Marital status: Widowed    Spouse name: Not on file   Number of children: Not on file   Years of education: Not on file   Highest education level: Not on file  Occupational History   Not on file  Tobacco Use   Smoking status: Never   Smokeless tobacco: Never  Vaping Use   Vaping status: Never Used  Substance and Sexual Activity   Alcohol use: No   Drug use: No   Sexual activity: Not on file  Other Topics Concern   Not on file  Social History Narrative   Husband passed away from Cancer in 24-Feb-2012  Social Determinants of Health   Financial Resource  Strain: Not on file  Food Insecurity: Not on file  Transportation Needs: Not on file  Physical Activity: Not on file  Stress: Not on file  Social Connections: Not on file     Family History: The patient's family history includes Diabetes Mellitus II in an other family member; Hypertension in an other family member.  ROS:   Please see the history of present illness.     All other systems reviewed and are negative.  EKGs/Labs/Other Studies Reviewed:    The following studies were reviewed today:  Echo 2023  1. Left ventricular ejection fraction, by estimation, is 60 to 65%. The  left ventricle has normal function. The left ventricle has no regional  wall motion abnormalities. Left ventricular diastolic parameters are  consistent with Grade I diastolic  dysfunction (impaired relaxation).   2. Right ventricular systolic function is normal. The right ventricular  size is normal. There is normal pulmonary artery systolic pressure. The  estimated right ventricular systolic pressure is 29.0 mmHg.   3. Left atrial size was mildly dilated.   4. The mitral valve is normal in structure. Trivial mitral valve  regurgitation. No evidence of mitral stenosis.   5. Tricuspid valve regurgitation is moderate.   6. The aortic valve is tricuspid. Aortic valve regurgitation is not  visualized. No aortic stenosis is present.   7. Pulmonic valve regurgitation is moderate.   8. The inferior vena cava is normal in size with greater than 50%  respiratory variability, suggesting right atrial pressure of 3 mmHg.   EKG:  EKG is ordered today.  The ekg ordered today demonstrates NSR, 63bpm, no ST/T wave changes  Recent Labs: 03/21/2022: ALT 35 03/22/2022: TSH 0.904 03/24/2022: BUN 34; Creatinine, Ser 1.29; Potassium 4.0; Sodium 140 03/25/2022: Hemoglobin 11.3; Platelets 152  Recent Lipid Panel    Component Value Date/Time   CHOL 171 03/22/2022 0508   TRIG 48 03/22/2022 0508   HDL 67 03/22/2022 0508    CHOLHDL 2.6 03/22/2022 0508   VLDL 10 03/22/2022 0508   LDLCALC 94 03/22/2022 0508    Physical Exam:    VS:  BP 118/62   Pulse 68   Ht 5\' 6"  (1.676 m)   Wt 182 lb 3.2 oz (82.6 kg)   SpO2 94%   BMI 29.41 kg/m     Wt Readings from Last 3 Encounters:  03/07/23 182 lb 3.2 oz (82.6 kg)  10/25/22 184 lb (83.5 kg)  07/14/22 186 lb (84.4 kg)     GEN:  Well nourished, well developed in no acute distress HEENT: Normal NECK: No JVD; No carotid bruits LYMPHATICS: No lymphadenopathy CARDIAC: RRR,  no murmurs, rubs, gallops RESPIRATORY:  Clear to auscultation without rales, wheezing or rhonchi  ABDOMEN: Soft, non-tender, non-distended MUSCULOSKELETAL:  No edema; No deformity  SKIN: Warm and dry NEUROLOGIC:  Alert and oriented x 3 PSYCHIATRIC:  Normal affect   ASSESSMENT:    1. Paroxysmal atrial flutter (HCC)   2. Hyperlipidemia, mixed   3. Coronary artery disease involving native coronary artery of native heart without angina pectoris    PLAN:    In order of problems listed above:  Paroxysmal Afib EKG shows NSR today. She reports rare palpitations at night. Continue Eliquis 5 mg daily twice daily for stroke prophylaxis.  Patient denies any bleeding issues.  Continue Lopressor for rate control.  I will request labs from PCP.  HLD I will request labs as above.  Continue Lipitor 40 mg daily.  CAD Patient had a non-STEMI in October 2023 that was medically managed.  Echo at that time showed normal pump function without wall motion abnormalities.  Patient denies any chest pain.  Continue Lopressor, Imdur, Lipitor, sublingual nitro.  No aspirin given Eliquis.  Disposition: Follow up in 6 month(s) with MD/APP    Signed, Aubrianna Orchard David Stall, PA-C  03/07/2023 3:31 PM    [facility]

## 2023-03-07 NOTE — Patient Instructions (Signed)
Medication Instructions:  Your physician recommends that you continue on your current medications as directed. Please refer to the Current Medication list given to you today.  *If you need a refill on your cardiac medications before your next appointment, please call your pharmacy*   Lab Work: NONE   If you have labs (blood work) drawn today and your tests are completely normal, you will receive your results only by: MyChart Message (if you have MyChart) OR A paper copy in the mail If you have any lab test that is abnormal or we need to change your treatment, we will call you to review the results.   Testing/Procedures: NONE    Follow-Up: At [facility], you and your health needs are our priority.  As part of our continuing mission to provide you with exceptional heart care, we have created designated Provider Care Teams.  These Care Teams include your primary Cardiologist (physician) and Advanced Practice Providers (APPs -  Physician Assistants and Nurse Practitioners) who all work together to provide you with the care you need, when you need it.  We recommend signing up for the patient portal called "MyChart".  Sign up information is provided on this After Visit Summary.  MyChart is used to connect with patients for Virtual Visits (Telemedicine).  Patients are able to view lab/test results, encounter notes, upcoming appointments, etc.  Non-urgent messages can be sent to your provider as well.   To learn more about what you can do with MyChart, go to [email or web address].    Your next appointment:   6 month(s)  Provider:   You may see Nona Dell, MD or one of the following Advanced Practice Providers on your designated Care Team:   Randall An, PA-C  Jacolyn Reedy, PA-C    Other Instructions Thank you for choosing [facility]!

## 2023-03-16 DIAGNOSIS — M79674 Pain in right toe(s): Secondary | ICD-10-CM | POA: Diagnosis not present

## 2023-03-16 DIAGNOSIS — E1151 Type 2 diabetes mellitus with diabetic peripheral angiopathy without gangrene: Secondary | ICD-10-CM | POA: Diagnosis not present

## 2023-03-16 DIAGNOSIS — L600 Ingrowing nail: Secondary | ICD-10-CM | POA: Diagnosis not present

## 2023-03-16 DIAGNOSIS — M79675 Pain in left toe(s): Secondary | ICD-10-CM | POA: Diagnosis not present

## 2023-03-16 DIAGNOSIS — B351 Tinea unguium: Secondary | ICD-10-CM | POA: Diagnosis not present

## 2023-03-16 DIAGNOSIS — L851 Acquired keratosis [keratoderma] palmaris et plantaris: Secondary | ICD-10-CM | POA: Diagnosis not present

## 2023-04-06 ENCOUNTER — Other Ambulatory Visit: Payer: Self-pay | Admitting: Cardiology

## 2023-04-10 ENCOUNTER — Other Ambulatory Visit: Payer: Self-pay | Admitting: Cardiology

## 2023-04-10 NOTE — Telephone Encounter (Signed)
Prescription refill request for Eliquis received. Indication: A Flutter Last office visit: 03/07/23  Peggyann Juba PA-C Scr: 1.24 on 02/14/23  [facility] Age: 89 Weight: 82.6kg  Based on above findings Eliquis 5mg  twice daily is the appropriate dose.  Refill approved.

## 2023-05-30 DIAGNOSIS — E11319 Type 2 diabetes mellitus with unspecified diabetic retinopathy without macular edema: Secondary | ICD-10-CM | POA: Diagnosis not present

## 2023-05-30 DIAGNOSIS — E1122 Type 2 diabetes mellitus with diabetic chronic kidney disease: Secondary | ICD-10-CM | POA: Diagnosis not present

## 2023-05-30 DIAGNOSIS — Z947 Corneal transplant status: Secondary | ICD-10-CM | POA: Diagnosis not present

## 2023-06-01 DIAGNOSIS — E1151 Type 2 diabetes mellitus with diabetic peripheral angiopathy without gangrene: Secondary | ICD-10-CM | POA: Diagnosis not present

## 2023-06-01 DIAGNOSIS — L851 Acquired keratosis [keratoderma] palmaris et plantaris: Secondary | ICD-10-CM | POA: Diagnosis not present

## 2023-06-01 DIAGNOSIS — M79675 Pain in left toe(s): Secondary | ICD-10-CM | POA: Diagnosis not present

## 2023-06-01 DIAGNOSIS — M79674 Pain in right toe(s): Secondary | ICD-10-CM | POA: Diagnosis not present

## 2023-06-01 DIAGNOSIS — B351 Tinea unguium: Secondary | ICD-10-CM | POA: Diagnosis not present

## 2023-07-18 DIAGNOSIS — E11319 Type 2 diabetes mellitus with unspecified diabetic retinopathy without macular edema: Secondary | ICD-10-CM | POA: Diagnosis not present

## 2023-08-10 DIAGNOSIS — B351 Tinea unguium: Secondary | ICD-10-CM | POA: Diagnosis not present

## 2023-08-10 DIAGNOSIS — L600 Ingrowing nail: Secondary | ICD-10-CM | POA: Diagnosis not present

## 2023-08-10 DIAGNOSIS — E1151 Type 2 diabetes mellitus with diabetic peripheral angiopathy without gangrene: Secondary | ICD-10-CM | POA: Diagnosis not present

## 2023-08-10 DIAGNOSIS — M79674 Pain in right toe(s): Secondary | ICD-10-CM | POA: Diagnosis not present

## 2023-08-10 DIAGNOSIS — M79675 Pain in left toe(s): Secondary | ICD-10-CM | POA: Diagnosis not present

## 2023-08-10 DIAGNOSIS — L851 Acquired keratosis [keratoderma] palmaris et plantaris: Secondary | ICD-10-CM | POA: Diagnosis not present

## 2023-08-15 DIAGNOSIS — E1122 Type 2 diabetes mellitus with diabetic chronic kidney disease: Secondary | ICD-10-CM | POA: Diagnosis not present

## 2023-08-15 DIAGNOSIS — I10 Essential (primary) hypertension: Secondary | ICD-10-CM | POA: Diagnosis not present

## 2023-08-21 DIAGNOSIS — M25561 Pain in right knee: Secondary | ICD-10-CM | POA: Diagnosis not present

## 2023-08-21 DIAGNOSIS — I10 Essential (primary) hypertension: Secondary | ICD-10-CM | POA: Diagnosis not present

## 2023-08-21 DIAGNOSIS — N189 Chronic kidney disease, unspecified: Secondary | ICD-10-CM | POA: Diagnosis not present

## 2023-08-21 DIAGNOSIS — E1122 Type 2 diabetes mellitus with diabetic chronic kidney disease: Secondary | ICD-10-CM | POA: Diagnosis not present

## 2023-08-21 DIAGNOSIS — E785 Hyperlipidemia, unspecified: Secondary | ICD-10-CM | POA: Diagnosis not present

## 2023-08-21 DIAGNOSIS — I482 Chronic atrial fibrillation, unspecified: Secondary | ICD-10-CM | POA: Diagnosis not present

## 2023-08-21 DIAGNOSIS — R809 Proteinuria, unspecified: Secondary | ICD-10-CM | POA: Diagnosis not present

## 2023-08-21 DIAGNOSIS — M542 Cervicalgia: Secondary | ICD-10-CM | POA: Diagnosis not present

## 2023-08-21 DIAGNOSIS — I129 Hypertensive chronic kidney disease with stage 1 through stage 4 chronic kidney disease, or unspecified chronic kidney disease: Secondary | ICD-10-CM | POA: Diagnosis not present

## 2023-08-21 DIAGNOSIS — E870 Hyperosmolality and hypernatremia: Secondary | ICD-10-CM | POA: Diagnosis not present

## 2023-08-21 DIAGNOSIS — E875 Hyperkalemia: Secondary | ICD-10-CM | POA: Diagnosis not present

## 2023-08-29 ENCOUNTER — Ambulatory Visit (HOSPITAL_COMMUNITY): Payer: Self-pay | Admitting: Occupational Therapy

## 2023-09-18 ENCOUNTER — Ambulatory Visit (HOSPITAL_COMMUNITY): Admitting: Occupational Therapy

## 2023-09-29 ENCOUNTER — Other Ambulatory Visit: Payer: Self-pay | Admitting: Cardiology

## 2023-09-29 DIAGNOSIS — I4892 Unspecified atrial flutter: Secondary | ICD-10-CM

## 2023-09-29 NOTE — Telephone Encounter (Signed)
 Prescription refill request for Eliquis  received. Indication: PAF Last office visit: 03/07/23 Scr: 0.96 [facility] 08/15/23 Age: 89 Weight: 82kg

## 2023-10-12 ENCOUNTER — Encounter (HOSPITAL_COMMUNITY): Payer: Self-pay | Admitting: Occupational Therapy

## 2023-10-12 ENCOUNTER — Other Ambulatory Visit: Payer: Self-pay

## 2023-10-12 ENCOUNTER — Ambulatory Visit (HOSPITAL_COMMUNITY): Attending: Family Medicine | Admitting: Occupational Therapy

## 2023-10-12 DIAGNOSIS — R29898 Other symptoms and signs involving the musculoskeletal system: Secondary | ICD-10-CM | POA: Insufficient documentation

## 2023-10-12 NOTE — Therapy (Signed)
 OUTPATIENT OCCUPATIONAL THERAPY ORTHO EVALUATION  Patient Name: Audrey Walker MRN: [id number] DOB:1932/09/23, 89 y.o., female Today's Date: 10/12/2023   END OF SESSION:  OT End of Session - 10/12/23 1555     Visit Number 1    Number of Visits 4    Date for OT Re-Evaluation 11/11/23    Authorization Type [employer], $15 copay    Authorization Time Period no auth required    Progress Note Due on Visit 10    OT Start Time 1518    OT Stop Time 1600    OT Time Calculation (min) 42 min    Activity Tolerance Patient tolerated treatment well    Behavior During Therapy WFL for tasks assessed/performed             Past Medical History:  Diagnosis Date   Atrial flutter (HCC)    CKD (chronic kidney disease) stage 2, GFR 60-89 ml/min    Essential hypertension, benign    Type 2 diabetes mellitus (HCC)    Past Surgical History:  Procedure Laterality Date   APPENDECTOMY     CORNEAL TRANSPLANT Right    EYE SURGERY     GALLBLADDER SURGERY     Patient Active Problem List   Diagnosis Date Noted   PAF (paroxysmal atrial fibrillation) (HCC)    AKI (acute kidney injury) (HCC)    NSTEMI (non-ST elevated myocardial infarction) (HCC) 03/21/2022   Chronic kidney disease, stage 3a (HCC) 03/21/2022   DNR (do not resuscitate) 03/21/2022   Hyponatremia 06/01/2020   COVID-19 virus infection 06/01/2020   Type 2 diabetes mellitus (HCC) 04/29/2013   Atrial flutter (HCC) 04/29/2013   Essential hypertension, benign 04/29/2013   PCP: Dr. Denman Fischer REFERRING PROVIDER: Wendi Ham, NP  ONSET DATE: chronic, ~1 year ago  REFERRING DIAG: difficulty gripping  THERAPY DIAG:  Other symptoms and signs involving the musculoskeletal system  Rationale for Evaluation and Treatment: Rehabilitation  SUBJECTIVE:   SUBJECTIVE STATEMENT: S: "I don't have any strength in my hands."  Pt accompanied by: self  PERTINENT HISTORY: Pt is a 89 year old female presenting with bilateral hand  weakness. Pt reports her left hand is weaker than her right and has been weak for approximately 12 months. Pt reports her left hand will try to draw up on her occasionally.   PRECAUTIONS: None  WEIGHT BEARING RESTRICTIONS: No  PAIN:  Are you having pain? Yes: NPRS scale: 4/10 Pain location: left thigh Pain description: sore Aggravating factors: moving Relieving factors: heating pad  FALLS: Has patient fallen in last 6 months? No  PLOF: Independent with basic ADLs  PATIENT GOALS: To be stronger in her hands.   NEXT MD VISIT: unsure  OBJECTIVE:  Note: Objective measures were completed at Evaluation unless otherwise noted.  HAND DOMINANCE: Right  ADLs: Pt reports difficulty grasping objects, does not trust the left hand to carry objects due to dropping items. Pt has difficulty with cutting food, squeezing a washcloth out, holding utensils is getting more difficult.   FUNCTIONAL OUTCOME MEASURES: Quick Dash: 36.36  UPPER EXTREMITY ROM:        BUE ROM is WNL   UPPER EXTREMITY MMT:     MMT Right eval Left eval  Wrist flexion 5/5 5/5  Wrist extension 5/5 5/5  Wrist ulnar deviation 5/5 5/5  Wrist radial deviation 5/5 5/5  (Blank rows = not tested)  HAND FUNCTION: Grip strength: Right: 26 lbs; Left: 18 lbs, Lateral pinch: Right: 8 lbs, Left: 2 lbs, and 3  point pinch: Right: 5 lbs, Left: 2 lbs  COORDINATION: 9 Hole Peg test: Right: 1' 15 sec; Left: 1' 15 sec *Vision impacting speed*  SENSATION: Light touch: WFL  EDEMA: None  COGNITION: Overall cognitive status: Within functional limits for tasks assessed   TREATMENT DATE:  -Educated on AE available to improve success with ADLs  -Theraputty: yellow-roll into a ball, flatten, roll into a log, pinch, grip                                                                                                                              PATIENT EDUCATION: Education details: AE for ADLs, yellow theraputty grip  strengthening Person educated: Patient Education method: Programmer, multimedia, Demonstration, and Handouts Education comprehension: verbalized understanding and returned demonstration  HOME EXERCISE PROGRAM: Eval: AE for ADLs, yellow theraputty grip strengthening  GOALS: Goals reviewed with patient? Yes  SHORT TERM GOALS: Target date: 11/10/23  Pt will be provided with and educated on HEP to improve bilateral grip strength required for ADL completion.  Goal status: INITIAL  2.  Pt will increase bilateral grip strength by 5# and pinch strength by 3# to increase ability to cut food during meal preparation and self-feeding.    Goal status: INITIAL  3.  Pt will be educated on AE available to improve independence and success in ADL completion.   Goal status: INITIAL  4.  Pt will improve bilateral fine motor coordination by completing 9 hole peg test in under 1', to improve success with tedious tasks such as medication management.   Goal status: INITIAL   ASSESSMENT:  CLINICAL IMPRESSION: Patient is a 89 y.o. female who was seen today for occupational therapy evaluation for bilateral hand weakness impacting her ADL completion. Pt reports left hand is weaker than the right hand, has noticed increasing left hand weakness over the past year.  Unknown origin of hand weakness, pt without hx of neuropathy or CVA. Provided examples of AE for pt and family to research, HEP for grip strengthening.   PERFORMANCE DEFICITS: in functional skills including ADLs, IADLs, coordination, dexterity, strength, and UE functional use  IMPAIRMENTS: are limiting patient from ADLs, IADLs, and leisure.   COMORBIDITIES: has no other co-morbidities that affects occupational performance. Patient will benefit from skilled OT to address above impairments and improve overall function.  MODIFICATION OR ASSISTANCE TO COMPLETE EVALUATION: No modification of tasks or assist necessary to complete an evaluation.  OT  OCCUPATIONAL PROFILE AND HISTORY: Problem focused assessment: Including review of records relating to presenting problem.  CLINICAL DECISION MAKING: LOW - limited treatment options, no task modification necessary  REHAB POTENTIAL: Good  EVALUATION COMPLEXITY: Low      PLAN:  OT FREQUENCY: 1x/week  OT DURATION: 4 weeks  PLANNED INTERVENTIONS: 97168 OT Re-evaluation, 97535 self care/ADL training, 16109 therapeutic exercise, 97530 therapeutic activity, 97112 neuromuscular re-education, patient/family education, and DME and/or AE instructions  RECOMMENDED OTHER SERVICES: None at this time  CONSULTED AND AGREED  WITH PLAN OF CARE: Patient  PLAN FOR NEXT SESSION: Follow up on HEP and AE research, begin grip and pinch strengthening, coordination work   [employer], OTR/L  [phone] 10/12/2023, 4:11 PM

## 2023-10-12 NOTE — Patient Instructions (Signed)

## 2023-10-19 DIAGNOSIS — M79674 Pain in right toe(s): Secondary | ICD-10-CM | POA: Diagnosis not present

## 2023-10-19 DIAGNOSIS — E1151 Type 2 diabetes mellitus with diabetic peripheral angiopathy without gangrene: Secondary | ICD-10-CM | POA: Diagnosis not present

## 2023-10-19 DIAGNOSIS — M79675 Pain in left toe(s): Secondary | ICD-10-CM | POA: Diagnosis not present

## 2023-10-19 DIAGNOSIS — B351 Tinea unguium: Secondary | ICD-10-CM | POA: Diagnosis not present

## 2023-10-19 DIAGNOSIS — L600 Ingrowing nail: Secondary | ICD-10-CM | POA: Diagnosis not present

## 2023-10-19 DIAGNOSIS — L851 Acquired keratosis [keratoderma] palmaris et plantaris: Secondary | ICD-10-CM | POA: Diagnosis not present

## 2023-10-27 ENCOUNTER — Observation Stay (HOSPITAL_COMMUNITY)
Admission: EM | Admit: 2023-10-27 | Discharge: 2023-10-28 | Disposition: A | Discharge status: Home / Self Care | Attending: Family Medicine | Admitting: Family Medicine

## 2023-10-27 ENCOUNTER — Other Ambulatory Visit: Payer: Self-pay

## 2023-10-27 ENCOUNTER — Emergency Department (HOSPITAL_COMMUNITY)

## 2023-10-27 ENCOUNTER — Encounter (HOSPITAL_COMMUNITY): Payer: Self-pay

## 2023-10-27 DIAGNOSIS — I4891 Unspecified atrial fibrillation: Principal | ICD-10-CM

## 2023-10-27 DIAGNOSIS — N1832 Chronic kidney disease, stage 3b: Secondary | ICD-10-CM | POA: Diagnosis not present

## 2023-10-27 DIAGNOSIS — Z79899 Other long term (current) drug therapy: Secondary | ICD-10-CM | POA: Insufficient documentation

## 2023-10-27 DIAGNOSIS — E782 Mixed hyperlipidemia: Secondary | ICD-10-CM | POA: Diagnosis not present

## 2023-10-27 DIAGNOSIS — R7989 Other specified abnormal findings of blood chemistry: Secondary | ICD-10-CM | POA: Insufficient documentation

## 2023-10-27 DIAGNOSIS — R778 Other specified abnormalities of plasma proteins: Secondary | ICD-10-CM | POA: Diagnosis not present

## 2023-10-27 DIAGNOSIS — I10 Essential (primary) hypertension: Secondary | ICD-10-CM | POA: Diagnosis present

## 2023-10-27 DIAGNOSIS — R079 Chest pain, unspecified: Secondary | ICD-10-CM | POA: Diagnosis present

## 2023-10-27 DIAGNOSIS — Z7901 Long term (current) use of anticoagulants: Secondary | ICD-10-CM | POA: Insufficient documentation

## 2023-10-27 DIAGNOSIS — I129 Hypertensive chronic kidney disease with stage 1 through stage 4 chronic kidney disease, or unspecified chronic kidney disease: Secondary | ICD-10-CM | POA: Diagnosis not present

## 2023-10-27 DIAGNOSIS — I480 Paroxysmal atrial fibrillation: Principal | ICD-10-CM | POA: Diagnosis present

## 2023-10-27 DIAGNOSIS — E1122 Type 2 diabetes mellitus with diabetic chronic kidney disease: Secondary | ICD-10-CM | POA: Diagnosis not present

## 2023-10-27 DIAGNOSIS — N183 Chronic kidney disease, stage 3 unspecified: Secondary | ICD-10-CM | POA: Diagnosis present

## 2023-10-27 DIAGNOSIS — R0602 Shortness of breath: Secondary | ICD-10-CM | POA: Insufficient documentation

## 2023-10-27 DIAGNOSIS — Z7982 Long term (current) use of aspirin: Secondary | ICD-10-CM | POA: Diagnosis not present

## 2023-10-27 DIAGNOSIS — R0789 Other chest pain: Secondary | ICD-10-CM | POA: Diagnosis present

## 2023-10-27 LAB — CBC WITH DIFFERENTIAL/PLATELET
Abs Immature Granulocytes: 0.03 10*3/uL (ref 0.00–0.07)
Basophils Absolute: 0 10*3/uL (ref 0.0–0.1)
Basophils Relative: 0 %
Eosinophils Absolute: 0.1 10*3/uL (ref 0.0–0.5)
Eosinophils Relative: 1 %
HCT: 39.6 % (ref 36.0–46.0)
Hemoglobin: 13.3 g/dL (ref 12.0–15.0)
Immature Granulocytes: 0 %
Lymphocytes Relative: 25 %
Lymphs Abs: 2.5 10*3/uL (ref 0.7–4.0)
MCH: 31.7 pg (ref 26.0–34.0)
MCHC: 33.6 g/dL (ref 30.0–36.0)
MCV: 94.5 fL (ref 80.0–100.0)
Monocytes Absolute: 0.8 10*3/uL (ref 0.1–1.0)
Monocytes Relative: 8 %
Neutro Abs: 6.3 10*3/uL (ref 1.7–7.7)
Neutrophils Relative %: 66 %
Platelets: 168 10*3/uL (ref 150–400)
RBC: 4.19 MIL/uL (ref 3.87–5.11)
RDW: 11.9 % (ref 11.5–15.5)
WBC: 9.7 10*3/uL (ref 4.0–10.5)
nRBC: 0 % (ref 0.0–0.2)

## 2023-10-27 LAB — COMPREHENSIVE METABOLIC PANEL WITH GFR
ALT: 40 U/L (ref 0–44)
AST: 42 U/L — ABNORMAL HIGH (ref 15–41)
Albumin: 3.9 g/dL (ref 3.5–5.0)
Alkaline Phosphatase: 71 U/L (ref 38–126)
Anion gap: 9 (ref 5–15)
BUN: 30 mg/dL — ABNORMAL HIGH (ref 8–23)
CO2: 26 mmol/L (ref 22–32)
Calcium: 9.9 mg/dL (ref 8.9–10.3)
Chloride: 103 mmol/L (ref 98–111)
Creatinine, Ser: 1.36 mg/dL — ABNORMAL HIGH (ref 0.44–1.00)
GFR, Estimated: 37 mL/min — ABNORMAL LOW (ref >60)
Glucose, Bld: 158 mg/dL — ABNORMAL HIGH (ref 70–99)
Potassium: 4.1 mmol/L (ref 3.5–5.1)
Sodium: 138 mmol/L (ref 135–145)
Total Bilirubin: 1.3 mg/dL — ABNORMAL HIGH (ref 0.0–1.2)
Total Protein: 7.2 g/dL (ref 6.5–8.1)

## 2023-10-27 LAB — TROPONIN I (HIGH SENSITIVITY): Troponin I (High Sensitivity): 176 ng/L (ref <18)

## 2023-10-27 LAB — BRAIN NATRIURETIC PEPTIDE: B Natriuretic Peptide: 93 pg/mL (ref 0.0–100.0)

## 2023-10-27 MED ORDER — METOPROLOL TARTRATE 5 MG/5ML IV SOLN
2.5000 mg | Freq: Once | INTRAVENOUS | Status: AC
  Administered 2023-10-27: 2.5 mg via INTRAVENOUS
  Filled 2023-10-27: qty 5

## 2023-10-27 MED ORDER — ASPIRIN 81 MG PO CHEW
324.0000 mg | CHEWABLE_TABLET | Freq: Once | ORAL | Status: AC
  Administered 2023-10-27: 324 mg via ORAL
  Filled 2023-10-27: qty 4

## 2023-10-27 NOTE — ED Provider Notes (Signed)
 11:08 PM Assumed care from Dr. Aldean Amass, please see their note for full history, physical and decision making until this point. In brief this is a 89 y.o. year old female who presented to the ED tonight with Chest Pain     Here with CP. Afib w/ rivr initially, improved with metop. Pain improved with metop/NTG. Pending second troponin.   Second trop still a bit higher than first. Still with some chest discomfort even though HR is in 80's (still Afib). After long discussion with patient and daughter she has agreed to stay for observation. Will add on fentanyl  as she has soft pressures so want to hold on a bit with more NTG. I suspect the troponins are from afib rvr rather than acute occlusion however she has never had a cath. Will hold on heparin  for now, eliquis  ordered, eye drop ordered and tresiba  ordered.   CRITICAL CARE Performed by: Eve Hinders Total critical care time: 31 minutes Critical care time was exclusive of separately billable procedures and treating other patients. Critical care was necessary to treat or prevent imminent or life-threatening deterioration. Critical care was time spent personally by me on the following activities: development of treatment plan with patient and/or surrogate as well as nursing, discussions with consultants, evaluation of patient's response to treatment, examination of patient, obtaining history from patient or surrogate, ordering and performing treatments and interventions, ordering and review of laboratory studies, ordering and review of radiographic studies, pulse oximetry and re-evaluation of patient's condition.   Labs, studies and imaging reviewed by myself and considered in medical decision making if ordered. Imaging interpreted by radiology.  Labs Reviewed  COMPREHENSIVE METABOLIC PANEL WITH GFR - Abnormal; Notable for the following components:      Result Value   Glucose, Bld 158 (*)    BUN 30 (*)    Creatinine, Ser 1.36 (*)    AST 42 (*)     Total Bilirubin 1.3 (*)    GFR, Estimated 37 (*)    All other components within normal limits  TROPONIN I (HIGH SENSITIVITY) - Abnormal; Notable for the following components:   Troponin I (High Sensitivity) 176 (*)    All other components within normal limits  CBC WITH DIFFERENTIAL/PLATELET  BRAIN NATRIURETIC PEPTIDE  TROPONIN I (HIGH SENSITIVITY)    DG Chest Portable 1 View  Final Result      No follow-ups on file.    Audrey Walker, Reymundo Caulk, MD 10/28/23 [phone]

## 2023-10-27 NOTE — ED Provider Notes (Signed)
 [place] EMERGENCY DEPARTMENT AT [facility] Provider Note   CSN: [id number] Arrival date & time: 10/27/23  2050     History  Chief Complaint  Patient presents with   Chest Pain    Audrey Walker is a 89 y.o. female.  HPI 89 year old female with a history of paroxysmal flutter who takes Eliquis  as well as a history of hypertension, CKD, type 2 diabetes and NSTEMI presents with chest discomfort.  She states that a little after 7 PM she started feeling a heaviness and discomfort in her left chest under her breast.  She took a total of 3 nitroglycerin , each 5 minutes apart which did help take the discomfort from an 8 down to a 1.  She denies any shortness of breath.  She has been having some bilateral lower extremity edema for the last couple weeks more than normal.  She denies any palpitations and states she cannot normally tell when she is in or out of A-fib.  Home Medications Prior to Admission medications   Medication Sig Start Date End Date Taking? Authorizing Provider  acetaminophen  (TYLENOL ) 500 MG tablet Take 500 mg by mouth every 6 (six) hours as needed for mild pain.    [provider]  albuterol  (VENTOLIN  HFA) 108 (90 Base) MCG/ACT inhaler Inhale 1-2 puffs into the lungs every 4 (four) hours as needed for wheezing or shortness of breath. 01/27/22   [provider]  atorvastatin  (LIPITOR) 40 MG tablet TAKE 1 TABLET BY MOUTH ONCE DAILY AT [facility] 04/10/23   Gerard Knight, MD  B-D ULTRAFINE III SHORT PEN 31G X 8 MM MISC Inject 1 Syringe into the skin daily. 09/30/19   [provider]  Biotin w/ Vitamins C & E (HAIR/SKIN/NAILS PO) Take 1 tablet by mouth daily.    [provider]  Blood Glucose Monitoring Suppl (ONE TOUCH ULTRA 2) w/Device KIT  07/02/19   [provider]  Cholecalciferol (VITAMIN D3) 1.25 MG (50000 UT) CAPS Take 1 capsule by mouth daily.    [provider]  Cyanocobalamin (VITAMIN B 12 PO) Take 1 tablet  by mouth daily.     [provider]  Difluprednate 0.05 % EMUL Place 1 drop into the right eye 3 (three) times daily. 10/17/22   [provider]  ELIQUIS  5 MG TABS tablet Take 1 tablet by mouth twice daily 09/29/23   Gerard Knight, MD  fish oil-omega-3 fatty acids 1000 MG capsule Take 1 g by mouth daily.    [provider]  fluticasone  (FLONASE ) 50 MCG/ACT nasal spray Place 1 spray into both nostrils daily. 02/07/22   Sueellen Emery, MD  isosorbide  mononitrate (IMDUR ) 30 MG 24 hr tablet Take 1/2 (one-half) tablet by mouth once daily 09/29/23   McDowell, Samuel G, MD  metoprolol  tartrate (LOPRESSOR ) 25 MG tablet Take 1 tablet by mouth twice daily 04/10/23   Gerard Knight, MD  milk thistle 175 MG tablet Take 175 mg by mouth daily.    [provider]  nitroGLYCERIN  (NITROSTAT ) 0.4 MG SL tablet Place 1 tablet (0.4 mg total) under the tongue every 5 (five) minutes x 3 doses as needed for up to 3 days for chest pain. 03/25/22 10/25/22  Shahmehdi, Seyed A, MD  TRESIBA  FLEXTOUCH 200 UNIT/ML FlexTouch Pen Inject 18 Units into the skin at bedtime. 02/02/22   [provider]  Caryle Class Oil (ARTIFICIAL TEARS) ointment Place 2 drops into both eyes 3 (three) times daily.  [provider]      Allergies    Nsaids    Review of Systems   Review of Systems  Respiratory:  Negative for shortness of breath.   Cardiovascular:  Positive for chest pain and leg swelling. Negative for palpitations.    Physical Exam Updated Vital Signs BP 123/69   Pulse (!) 114   Temp 98.4 F (36.9 C) (Oral)   Resp 15   Ht 5\' 6"  (1.676 m)   Wt 82.6 kg   SpO2 98%   BMI 29.38 kg/m  Physical Exam Vitals and nursing note reviewed.  Constitutional:      Appearance: She is well-developed.  HENT:     Head: Normocephalic and atraumatic.  Cardiovascular:     Rate and Rhythm: Tachycardia present. Rhythm irregular.     Heart sounds: Normal heart sounds.   Pulmonary:     Effort: Pulmonary effort is normal.     Breath sounds: Normal breath sounds.  Abdominal:     Palpations: Abdomen is soft.     Tenderness: There is no abdominal tenderness.  Musculoskeletal:     Comments: Bilateral pitting edema of feet, ankles and lower legs.  Skin:    General: Skin is warm and dry.  Neurological:     Mental Status: She is alert.     ED Results / Procedures / Treatments   Labs (all labs ordered are listed, but only abnormal results are displayed) Labs Reviewed  COMPREHENSIVE METABOLIC PANEL WITH GFR  CBC WITH DIFFERENTIAL/PLATELET  BRAIN NATRIURETIC PEPTIDE  TROPONIN I (HIGH SENSITIVITY)    EKG EKG Interpretation Date/Time:  Friday Oct 27 2023 21:04:16 EDT Ventricular Rate:  127 PR Interval:    QRS Duration:  78 QT Interval:  308 QTC Calculation: 448 R Axis:   30  Text Interpretation: Atrial fibrillation Ventricular premature complex Low voltage, precordial leads Consider anterior infarct Minimal ST depression, inferior leads Confirmed by Jerilynn Montenegro [phone]) on 10/27/2023 9:13:28 PM  Radiology No results found.  Procedures Procedures    Medications Ordered in ED Medications  metoprolol  tartrate (LOPRESSOR ) injection 2.5 mg (has no administration in time range)  aspirin  chewable tablet 324 mg (has no administration in time range)    ED Course/ Medical Decision Making/ A&P                                 Medical Decision Making Amount and/or Complexity of Data Reviewed External Data Reviewed: notes. Labs: ordered.    Details: Troponin 176, similar to other values Radiology: ordered and independent interpretation performed.    Details: No CHF ECG/medicine tests: ordered and independent interpretation performed.    Details: A-fib  Risk OTC drugs. Prescription drug management.   Patient presents with chest heaviness/pressure that is significantly improving.  Did improve with multiple nitros.  First troponin is  elevated but similar to other values.  She will certainly need at least a second.  Right now her symptoms are essentially resolved.  She did also get a dose of metoprolol  for A-fib with RVR though has known paroxysmal A-fib.  She has been compliant with her Eliquis .  I have a low suspicion for PE, pneumonia, dissection.  Had discussion with patient and family about coming into the hospital for observation versus close cardiology follow-up.  At this time, patient is preferring not to stay in the hospital.  Will certainly need the second troponin, her heart rate is much better  and will give another dose of 2.5 mg metoprolol .  Care transferred to Dr. Luberta Ruse.        Final Clinical Impression(s) / ED Diagnoses Final diagnoses:  None    Rx / DC Orders ED Discharge Orders     None         Jerilynn Montenegro, MD 10/27/23 2329

## 2023-10-27 NOTE — ED Triage Notes (Signed)
 Pt arrives from home with daughter and son in law with complaints of chest pain starting tonight 'sometime after 7' pt reports it is a pressure and states that she took 3 nitroglycerin  tabs every 5 minutes. Pain is left sided. Pt does endorse headache after nitroglycerin .

## 2023-10-28 ENCOUNTER — Observation Stay (HOSPITAL_COMMUNITY)

## 2023-10-28 DIAGNOSIS — R079 Chest pain, unspecified: Secondary | ICD-10-CM

## 2023-10-28 DIAGNOSIS — N1832 Chronic kidney disease, stage 3b: Secondary | ICD-10-CM | POA: Diagnosis not present

## 2023-10-28 DIAGNOSIS — I10 Essential (primary) hypertension: Secondary | ICD-10-CM | POA: Diagnosis not present

## 2023-10-28 DIAGNOSIS — R7989 Other specified abnormal findings of blood chemistry: Secondary | ICD-10-CM | POA: Diagnosis not present

## 2023-10-28 DIAGNOSIS — I480 Paroxysmal atrial fibrillation: Secondary | ICD-10-CM | POA: Diagnosis not present

## 2023-10-28 DIAGNOSIS — I4891 Unspecified atrial fibrillation: Secondary | ICD-10-CM | POA: Diagnosis not present

## 2023-10-28 DIAGNOSIS — E782 Mixed hyperlipidemia: Secondary | ICD-10-CM | POA: Diagnosis not present

## 2023-10-28 LAB — COMPREHENSIVE METABOLIC PANEL WITH GFR
ALT: 38 U/L (ref 0–44)
AST: 42 U/L — ABNORMAL HIGH (ref 15–41)
Albumin: 3.4 g/dL — ABNORMAL LOW (ref 3.5–5.0)
Alkaline Phosphatase: 58 U/L (ref 38–126)
Anion gap: 8 (ref 5–15)
BUN: 27 mg/dL — ABNORMAL HIGH (ref 8–23)
CO2: 27 mmol/L (ref 22–32)
Calcium: 9.3 mg/dL (ref 8.9–10.3)
Chloride: 104 mmol/L (ref 98–111)
Creatinine, Ser: 1.14 mg/dL — ABNORMAL HIGH (ref 0.44–1.00)
GFR, Estimated: 46 mL/min — ABNORMAL LOW (ref >60)
Glucose, Bld: 78 mg/dL (ref 70–99)
Potassium: 3.6 mmol/L (ref 3.5–5.1)
Sodium: 139 mmol/L (ref 135–145)
Total Bilirubin: 1.1 mg/dL (ref 0.0–1.2)
Total Protein: 6.2 g/dL — ABNORMAL LOW (ref 6.5–8.1)

## 2023-10-28 LAB — CBC
HCT: 38.9 % (ref 36.0–46.0)
Hemoglobin: 12.9 g/dL (ref 12.0–15.0)
MCH: 31.7 pg (ref 26.0–34.0)
MCHC: 33.2 g/dL (ref 30.0–36.0)
MCV: 95.6 fL (ref 80.0–100.0)
Platelets: 156 10*3/uL (ref 150–400)
RBC: 4.07 MIL/uL (ref 3.87–5.11)
RDW: 11.9 % (ref 11.5–15.5)
WBC: 8.9 10*3/uL (ref 4.0–10.5)
nRBC: 0 % (ref 0.0–0.2)

## 2023-10-28 LAB — ECHOCARDIOGRAM COMPLETE
AR max vel: 1.84 cm2
AV Peak grad: 3 mmHg
Ao pk vel: 0.86 m/s
Area-P 1/2: 2.36 cm2
Height: 66 in
MV VTI: 1.75 cm2
S' Lateral: 3.6 cm
Weight: 2899.2 [oz_av]

## 2023-10-28 LAB — TROPONIN I (HIGH SENSITIVITY)
Troponin I (High Sensitivity): 181 ng/L (ref <18)
Troponin I (High Sensitivity): 201 ng/L (ref <18)
Troponin I (High Sensitivity): 212 ng/L (ref <18)
Troponin I (High Sensitivity): 235 ng/L (ref <18)

## 2023-10-28 LAB — MAGNESIUM: Magnesium: 1.6 mg/dL — ABNORMAL LOW (ref 1.7–2.4)

## 2023-10-28 LAB — PHOSPHORUS: Phosphorus: 3.1 mg/dL (ref 2.5–4.6)

## 2023-10-28 MED ORDER — PREDNISOLONE ACETATE 1 % OP SUSP
1.0000 [drp] | Freq: Every day | OPHTHALMIC | Status: DC
  Administered 2023-10-28: 1 [drp] via OPHTHALMIC
  Filled 2023-10-28: qty 1

## 2023-10-28 MED ORDER — ASPIRIN 81 MG PO TBEC: 81.0000 mg | DELAYED_RELEASE_TABLET | Freq: Every day | ORAL | 0 refills | Status: AC

## 2023-10-28 MED ORDER — ACETAMINOPHEN 650 MG RE SUPP: 650.0000 mg | Freq: Four times a day (QID) | RECTAL | Status: DC | PRN

## 2023-10-28 MED ORDER — FENTANYL CITRATE PF 50 MCG/ML IJ SOSY
50.0000 ug | PREFILLED_SYRINGE | Freq: Once | INTRAMUSCULAR | Status: AC
  Administered 2023-10-28: 50 ug via INTRAVENOUS
  Filled 2023-10-28: qty 1

## 2023-10-28 MED ORDER — ASPIRIN 81 MG PO TBEC
81.0000 mg | DELAYED_RELEASE_TABLET | Freq: Every day | ORAL | Status: DC
  Administered 2023-10-28: 81 mg via ORAL
  Filled 2023-10-28: qty 1

## 2023-10-28 MED ORDER — APIXABAN 5 MG PO TABS
5.0000 mg | ORAL_TABLET | Freq: Two times a day (BID) | ORAL | Status: DC
  Administered 2023-10-28 (×2): 5 mg via ORAL
  Filled 2023-10-28 (×2): qty 1

## 2023-10-28 MED ORDER — ATORVASTATIN CALCIUM 40 MG PO TABS
40.0000 mg | ORAL_TABLET | Freq: Every day | ORAL | Status: DC
  Administered 2023-10-28: 40 mg via ORAL
  Filled 2023-10-28: qty 1

## 2023-10-28 MED ORDER — ONDANSETRON HCL 4 MG PO TABS: 4.0000 mg | ORAL_TABLET | Freq: Four times a day (QID) | ORAL | Status: DC | PRN

## 2023-10-28 MED ORDER — INSULIN GLARGINE-YFGN 100 UNIT/ML ~~LOC~~ SOLN
18.0000 [IU] | Freq: Every day | SUBCUTANEOUS | Status: DC
  Administered 2023-10-28: 18 [IU] via SUBCUTANEOUS
  Filled 2023-10-28 (×2): qty 0.18

## 2023-10-28 MED ORDER — ISOSORBIDE MONONITRATE ER 60 MG PO TB24
30.0000 mg | ORAL_TABLET | Freq: Every day | ORAL | Status: DC
  Administered 2023-10-28: 30 mg via ORAL
  Filled 2023-10-28: qty 1

## 2023-10-28 MED ORDER — ONDANSETRON HCL 4 MG/2ML IJ SOLN: 4.0000 mg | Freq: Four times a day (QID) | INTRAMUSCULAR | Status: DC | PRN

## 2023-10-28 MED ORDER — METOPROLOL TARTRATE 25 MG PO TABS: 25.0000 mg | ORAL_TABLET | Freq: Two times a day (BID) | ORAL | 2 refills | Status: DC

## 2023-10-28 MED ORDER — ACETAMINOPHEN 325 MG PO TABS: 650.0000 mg | ORAL_TABLET | Freq: Four times a day (QID) | ORAL | Status: DC | PRN

## 2023-10-28 MED ORDER — METOPROLOL TARTRATE 50 MG PO TABS: 50.0000 mg | ORAL_TABLET | Freq: Two times a day (BID) | ORAL | 1 refills | Status: DC

## 2023-10-28 MED ORDER — INSULIN GLARGINE-YFGN 100 UNIT/ML ~~LOC~~ SOPN
18.0000 [IU] | PEN_INJECTOR | Freq: Every day | SUBCUTANEOUS | Status: DC
  Filled 2023-10-28: qty 3

## 2023-10-28 MED ORDER — METOPROLOL TARTRATE 25 MG PO TABS: 25.0000 mg | ORAL_TABLET | Freq: Two times a day (BID) | ORAL | 2 refills | Status: AC

## 2023-10-28 MED ORDER — METOPROLOL TARTRATE 50 MG PO TABS
50.0000 mg | ORAL_TABLET | Freq: Two times a day (BID) | ORAL | Status: DC
  Administered 2023-10-28: 50 mg via ORAL
  Filled 2023-10-28: qty 1

## 2023-10-28 MED ORDER — MAGNESIUM SULFATE 4 GM/100ML IV SOLN
4.0000 g | Freq: Once | INTRAVENOUS | Status: AC
  Administered 2023-10-28: 4 g via INTRAVENOUS
  Filled 2023-10-28: qty 100

## 2023-10-28 NOTE — Discharge Instructions (Addendum)
 1)Very Low-salt diet advised---Less than 2 gm of Sodium per day advised----ok to use Mrs DASH salt substitute instead of Salt 2)Avoid ibuprofen/Advil/Aleve/Motrin/Goody Powders/Naproxen/BC powders/Meloxicam/Diclofenac/Indomethacin and other Nonsteroidal anti-inflammatory medications as these will make you more likely to bleed and can cause stomach ulcers, can also cause Kidney problems.  3)Watch for bleeding while on Blood Thinners--watch for blood in your stool which can make your stool black, maroon, mahogany or red---, blood in your urine which can make your urine pink or red, nosebleeds , also watch for possible bruising -You are taking Apixaban /Eliquis --- which is a blood thinner--- be careful to avoid injury or falls 4)Take aspirin  81 mg with food daily only for 1 month then Stop 5)follow up with your cardiologist Dr. Londa Rival in 2 to 3 weeks for recheck and reevaluation 6)May take additional Metoprolol  25 mg x 1, if HR 120 bpm (if SBP > 100 mmhg)

## 2023-10-28 NOTE — Plan of Care (Signed)
  Problem: Education: Goal: Knowledge of General Education information will improve Description: Including pain rating scale, medication(s)/side effects and non-pharmacologic comfort measures Outcome: Adequate for Discharge   Problem: Health Behavior/Discharge Planning: Goal: Ability to manage health-related needs will improve Outcome: Adequate for Discharge   Problem: Clinical Measurements: Goal: Ability to maintain clinical measurements within normal limits will improve Outcome: Adequate for Discharge Goal: Will remain free from infection Outcome: Adequate for Discharge Goal: Diagnostic test results will improve Outcome: Adequate for Discharge Goal: Respiratory complications will improve Outcome: Adequate for Discharge   Problem: Activity: Goal: Risk for activity intolerance will decrease Outcome: Adequate for Discharge   Problem: Pain Managment: Goal: General experience of comfort will improve and/or be controlled Outcome: Adequate for Discharge   Problem: Safety: Goal: Ability to remain free from injury will improve Outcome: Adequate for Discharge   Problem: Skin Integrity: Goal: Risk for impaired skin integrity will decrease Outcome: Adequate for Discharge

## 2023-10-28 NOTE — Progress Notes (Signed)
   10/28/23 1019  TOC Brief Assessment  Insurance and Status Reviewed  Patient has primary care physician Yes  Home environment has been reviewed Single Family Residence  Prior level of function: Independnet  Prior/Current Home Services No current home services  Social Drivers of Health Review SDOH reviewed no interventions necessary  Transition of care needs no transition of care needs at this time   Transition of Care Department [facility]) has reviewed patient, and no TOC needs have been identified at this time. We will continue to monitor patient advancement through interdisciplinary progression rounds. If new patient transition needs arise, please place a TOC consult.

## 2023-10-28 NOTE — H&P (Addendum)
 History and Physical    Patient: Audrey Walker ONG:[id number] DOB: 02/01/1933 DOA: 10/27/2023 DOS: the patient was seen and examined on 10/28/2023 PCP: Omie Bickers, MD  Patient coming from: Home  Chief Complaint:  Chief Complaint  Patient presents with   Chest Pain   HPI: Audrey Walker is an 89 y.o. female with medical history significant of essential hypertension, hyperlipidemia, atrial flutter on Eliquis , CKD 3B who presents to the emergency department due to chest pressure which started around 7 PM yesterday.  Pressure was described as sensation of heaviness on left side of chest under her breast.  She states that she took 3 sublingual nitroglycerin  tablets, 5 minutes apart and the chest pain which was initially rated as 8/10 on pain scale was reduced to a 1/10.  She denied shortness of breath, fever, chills, nausea, vomiting.  ED Course:  In the emergency department, she presents with tachycardia, but other vital signs were within normal range.  Workup in the ED showed normal CBC and BMP except for blood glucose of 158, BUN/creatinine 30/1.36 (creatinine is within baseline range).  Troponin 176 > 181.  BNP 93.0 Chest x-ray showed no active disease She was treated with aspirin  324 mg x 1, IV fentanyl  50 mcg x 1 was given.  IV Lopressor  2.5 mg x 1 was given.  Review of Systems: Review of systems as noted in the HPI. All other systems reviewed and are negative.   Past Medical History:  Diagnosis Date   Atrial flutter (HCC)    CKD (chronic kidney disease) stage 2, GFR 60-89 ml/min    Essential hypertension, benign    Type 2 diabetes mellitus (HCC)    Past Surgical History:  Procedure Laterality Date   APPENDECTOMY     CORNEAL TRANSPLANT Right    EYE SURGERY     GALLBLADDER SURGERY      Social History:  reports that she has never smoked. She has never used smokeless tobacco. She reports that she does not drink alcohol  and does not use drugs.   Allergies  Allergen Reactions    Nsaids Other (See Comments)    Per nephrologist    Family History  Problem Relation Age of Onset   Hypertension Other    Diabetes Mellitus II Other      Prior to Admission medications   Medication Sig Start Date End Date Taking? Authorizing Provider  acetaminophen  (TYLENOL ) 500 MG tablet Take 500 mg by mouth every 6 (six) hours as needed for mild pain.    [provider]  albuterol  (VENTOLIN  HFA) 108 (90 Base) MCG/ACT inhaler Inhale 1-2 puffs into the lungs every 4 (four) hours as needed for wheezing or shortness of breath. 01/27/22   [provider]  atorvastatin  (LIPITOR) 40 MG tablet TAKE 1 TABLET BY MOUTH ONCE DAILY AT [facility] 04/10/23   Gerard Knight, MD  B-D ULTRAFINE III SHORT PEN 31G X 8 MM MISC Inject 1 Syringe into the skin daily. 09/30/19   [provider]  Biotin w/ Vitamins C & E (HAIR/SKIN/NAILS PO) Take 1 tablet by mouth daily.    [provider]  Blood Glucose Monitoring Suppl (ONE TOUCH ULTRA 2) w/Device KIT  07/02/19   [provider]  Cholecalciferol (VITAMIN D3) 1.25 MG (50000 UT) CAPS Take 1 capsule by mouth daily.    [provider]  Cyanocobalamin (VITAMIN B 12 PO) Take 1 tablet by mouth daily.     [provider]  Difluprednate 0.05 % EMUL Place 1  drop into the right eye 3 (three) times daily. 10/17/22   [provider]  ELIQUIS  5 MG TABS tablet Take 1 tablet by mouth twice daily 09/29/23   Gerard Knight, MD  fish oil-omega-3 fatty acids 1000 MG capsule Take 1 g by mouth daily.    [provider]  fluticasone  (FLONASE ) 50 MCG/ACT nasal spray Place 1 spray into both nostrils daily. 02/07/22   Sueellen Emery, MD  isosorbide  mononitrate (IMDUR ) 30 MG 24 hr tablet Take 1/2 (one-half) tablet by mouth once daily 09/29/23   McDowell, Samuel G, MD  metoprolol  tartrate (LOPRESSOR ) 25 MG tablet Take 1 tablet by mouth twice daily 04/10/23   Gerard Knight, MD  milk thistle 175 MG tablet Take  175 mg by mouth daily.    [provider]  nitroGLYCERIN  (NITROSTAT ) 0.4 MG SL tablet Place 1 tablet (0.4 mg total) under the tongue every 5 (five) minutes x 3 doses as needed for up to 3 days for chest pain. 03/25/22 10/25/22  Shahmehdi, Seyed A, MD  TRESIBA  FLEXTOUCH 200 UNIT/ML FlexTouch Pen Inject 18 Units into the skin at bedtime. 02/02/22   [provider]  Caryle Class Oil (ARTIFICIAL TEARS) ointment Place 2 drops into both eyes 3 (three) times daily.    [provider]    Physical Exam: BP (!) 136/92 (BP Location: Left Arm)   Pulse (!) 107   Temp 97.7 F (36.5 C) (Oral)   Resp 18   Ht 5\' 6"  (1.676 m)   Wt 82.2 kg   SpO2 98%   BMI 29.25 kg/m   General: 89 y.o. year-old female well developed well nourished in no acute distress.  Alert and oriented x3. HEENT: NCAT, EOMI Neck: Supple, trachea medial Cardiovascular: Irregular rate and rhythm with no rubs or gallops.  No thyromegaly or JVD noted.  +2 lower extremity edema bilaterally. 2/4 pulses in all 4 extremities. Respiratory: Clear to auscultation with no wheezes or rales. Good inspiratory effort. Abdomen: Soft, nontender nondistended with normal bowel sounds x4 quadrants. Muskuloskeletal: No cyanosis, clubbing noted bilaterally Neuro: CN II-XII intact, strength 5/5 x 4, sensation, reflexes intact Skin: No ulcerative lesions noted or rashes Psychiatry: Judgement and insight appear normal. Mood is appropriate for condition and setting          Labs on Admission:  Basic Metabolic Panel: Recent Labs  Lab 10/27/23 2124  NA 138  K 4.1  CL 103  CO2 26  GLUCOSE 158*  BUN 30*  CREATININE 1.36*  CALCIUM  9.9   Liver Function Tests: Recent Labs  Lab 10/27/23 2124  AST 42*  ALT 40  ALKPHOS 71  BILITOT 1.3*  PROT 7.2  ALBUMIN 3.9   No results for input(s): "LIPASE", "AMYLASE" in the last 168 hours. No results for input(s): "AMMONIA" in the last 168 hours. CBC: Recent Labs  Lab  10/27/23 2124  WBC 9.7  NEUTROABS 6.3  HGB 13.3  HCT 39.6  MCV 94.5  PLT 168   Cardiac Enzymes: No results for input(s): "CKTOTAL", "CKMB", "CKMBINDEX", "TROPONINI" in the last 168 hours.  BNP (last 3 results) Recent Labs    10/27/23 2124  BNP 93.0    ProBNP (last 3 results) No results for input(s): "PROBNP" in the last 8760 hours.  CBG: No results for input(s): "GLUCAP" in the last 168 hours.  Radiological Exams on Admission: DG Chest Portable 1 View Result Date: 10/27/2023 CLINICAL DATA:  Chest pain EXAM: PORTABLE CHEST 1 VIEW COMPARISON:  None Available. FINDINGS:  The heart size and mediastinal contours are within normal limits. Both lungs are clear. The visualized skeletal structures are unremarkable. IMPRESSION: No active disease. Electronically Signed   By: Worthy Heads M.D.   On: 10/27/2023 21:58    EKG: I independently viewed the EKG done and my findings are as followed: Atrial fibrillation with rate controlled  Assessment/Plan Present on Admission:  Chest pain  PAF (paroxysmal atrial fibrillation) (HCC)  Essential hypertension, benign  CKD (chronic kidney disease) stage 3, GFR 30-59 ml/min (HCC)  Principal Problem:   Chest pain Active Problems:   Essential hypertension, benign   CKD (chronic kidney disease) stage 3, GFR 30-59 ml/min (HCC)   PAF (paroxysmal atrial fibrillation) (HCC)   Elevated troponin   Mixed hyperlipidemia  Chest pain possibly due to type II demand ischemia; R/O NSTEMI Elevated troponin Continue telemetry  Troponins x 2 -176 > 181 EKG showed atrial fibrillation with rate control Cardiology will be consulted to help decide if Stress test is needed in am Versus other  diagnostic modalities.     Paroxysmal Atrial Fibrillation Continue Eliquis  Lopressor  will be temporarily held pending cardiology consult  Essential hypertension Lopressor  will be temporarily held pending cardiology consult  Mixed hyperlipidemia Continue  Lipitor  CKD 3B BUN/creatinine 30/1.36 (creatinine is within baseline range). Renally adjust medications, avoid nephrotoxic agents/dehydration/hypotension  DVT prophylaxis: Eliquis   Code Status: Full code  Family Communication: Family at bedside (all questions answered to satisfaction)  Consults: Cardiology  Severity of Illness: The appropriate patient status for this patient is OBSERVATION. Observation status is judged to be reasonable and necessary in order to provide the required intensity of service to ensure the patient's safety. The patient's presenting symptoms, physical exam findings, and initial radiographic and laboratory data in the context of their medical condition is felt to place them at decreased risk for further clinical deterioration. Furthermore, it is anticipated that the patient will be medically stable for discharge from the hospital within 2 midnights of admission.   Author: Ahnna Dungan, DO 10/28/2023 5:04 AM  For on call review [email or web address].

## 2023-10-28 NOTE — Progress Notes (Signed)
 Critical Troponin 201 per lab @0638  Charge nurse and on-call physician notified.  No new orders placed

## 2023-10-28 NOTE — Discharge Summary (Signed)
 Audrey Walker, is a 89 y.o. female  DOB 08-Aug-1932  MRN [id number].  Admission date:  10/27/2023  Admitting Physician  Twilla Galea, DO  Discharge Date:  10/28/2023   Primary MD  Omie Bickers, MD  Recommendations for primary care physician for things to follow:   1)Very Low-salt diet advised---Less than 2 gm of Sodium per day advised----ok to use Mrs DASH salt substitute instead of Salt 2)Avoid ibuprofen/Advil/Aleve/Motrin/Goody Powders/Naproxen/BC powders/Meloxicam/Diclofenac/Indomethacin and other Nonsteroidal anti-inflammatory medications as these will make you more likely to bleed and can cause stomach ulcers, can also cause Kidney problems.  3)Watch for bleeding while on Blood Thinners--watch for blood in your stool which can make your stool black, maroon, mahogany or red---, blood in your urine which can make your urine pink or red, nosebleeds , also watch for possible bruising -You are taking Apixaban /Eliquis --- which is a blood thinner--- be careful to avoid injury or falls 4)Take aspirin  81 mg with food daily only for 1 month then Stop 5)follow up with your cardiologist Dr. Londa Rival in 2 to 3 weeks for recheck and reevaluation  Admission Diagnosis  Atrial fibrillation with rapid ventricular response (HCC) [I48.91] Chest pain [R07.9]   Discharge Diagnosis  Atrial fibrillation with rapid ventricular response (HCC) [I48.91] Chest pain [R07.9]    Principal Problem:   Chest pain Active Problems:   Essential hypertension, benign   CKD (chronic kidney disease) stage 3, GFR 30-59 ml/min (HCC)   PAF (paroxysmal atrial fibrillation) (HCC)   Elevated troponin   Mixed hyperlipidemia      Past Medical History:  Diagnosis Date   Atrial flutter (HCC)    CKD (chronic kidney disease) stage 2, GFR 60-89 ml/min    Essential hypertension, benign    Type 2 diabetes mellitus (HCC)     Past Surgical  History:  Procedure Laterality Date   APPENDECTOMY     CORNEAL TRANSPLANT Right    EYE SURGERY     GALLBLADDER SURGERY       HPI  from the history and physical done on the day of admission:      HPI: Audrey Walker is an 89 y.o. female with medical history significant of essential hypertension, hyperlipidemia, atrial flutter on Eliquis , CKD 3B who presents to the emergency department due to chest pressure which started around 7 PM yesterday.  Pressure was described as sensation of heaviness on left side of chest under her breast.  She states that she took 3 sublingual nitroglycerin  tablets, 5 minutes apart and the chest pain which was initially rated as 8/10 on pain scale was reduced to a 1/10.  She denied shortness of breath, fever, chills, nausea, vomiting.   ED Course:  In the emergency department, she presents with tachycardia, but other vital signs were within normal range.  Workup in the ED showed normal CBC and BMP except for blood glucose of 158, BUN/creatinine 30/1.36 (creatinine is within baseline range).  Troponin 176 > 181.  BNP 93.0 Chest x-ray showed no active disease She was treated with aspirin   324 mg x 1, IV fentanyl  50 mcg x 1 was given.  IV Lopressor  2.5 mg x 1 was given.   Review of Systems: Review of systems as noted in the HPI. All other systems reviewed and are negative.     Hospital Course:   1) elevated troponin --due to type II demand ischemia in the setting of A-fib with RVR on presentation  - Initial troponin 156 troponin peaked at 235 -EKG A-fib with RVR initially, repeat EKG without ischemic changes - Discussed with cardiologist Dr. Reginald Capri who reviewed patient's record and also read patient's echocardiogram, he Recommends medical management -Echo from 10/28/2023 with EF of 60 to 65%, (prior echo from October 2022 with grade 1 diastolic dysfunction), no pulmonary hypertension, no mitral stenosis, no aortic stenosis, mild LVH, no regional wall motion  abnormalities - Okay to add aspirin  81 mg daily for 1 month to patient's chronic Eliquis  regimen -Continue atorvastatin  and isosorbide  -Patient had similar presentation back in October 2023 -Outpatient follow-up with Dr. Londa Rival advised -Patient left-sided chest pain is consistently reproducible, no dyspnea or chest pain with exertional activities   2)Paroxysmal Atrial Fibrillation Continue Eliquis  CHA2DS2- VASc score   is = 6 (age x 2, gender, CHF, Vascular, DM2) Which is  equal to =9.7  % annual risk of stroke   This patients CHA2DS2-VASc Score and unadjusted Ischemic Stroke Rate (% per year) is equal to 9.7 % stroke rate/year from a score of 6 HAS-Bled Score is 1 - Continue metoprolol  for rate control, may take additional metoprolol  25 mg x 1 if heart rate greater than 120 as long as systolic BP is greater than 100  3)CKD 3B BUN/creatinine 30/1.36 (creatinine is within baseline range). Renally adjust medications, avoid nephrotoxic agents/dehydration/hypotension  Discharge Condition: stable  Follow UP   Follow-up Information     Gerard Knight, MD. Schedule an appointment as soon as possible for a visit in 2 week(s).   Specialty: Cardiology Contact information: [address] [place] [address] [phone]                 Consults obtained - Discussed with cardiologist Dr. Dorothye Gathers  Diet and Activity recommendation:  As advised  Discharge Instructions    Discharge Instructions     Call MD for:  difficulty breathing, headache or visual disturbances   Complete by: As directed    Call MD for:  persistant dizziness or light-headedness   Complete by: As directed    Call MD for:  persistant nausea and vomiting   Complete by: As directed    Call MD for:  temperature >100.4   Complete by: As directed    Diet - low sodium heart healthy   Complete by: As directed    Diet Carb Modified   Complete by: As directed    Discharge instructions   Complete by:  As directed    1)Very Low-salt diet advised---Less than 2 gm of Sodium per day advised----ok to use Mrs DASH salt substitute instead of Salt 2)Avoid ibuprofen/Advil/Aleve/Motrin/Goody Powders/Naproxen/BC powders/Meloxicam/Diclofenac/Indomethacin and other Nonsteroidal anti-inflammatory medications as these will make you more likely to bleed and can cause stomach ulcers, can also cause Kidney problems.  3)Watch for bleeding while on Blood Thinners--watch for blood in your stool which can make your stool black, maroon, mahogany or red---, blood in your urine which can make your urine pink or red, nosebleeds , also watch for possible bruising -You are taking Apixaban /Eliquis --- which is a blood thinner--- be careful  to avoid injury or falls 4)Take aspirin  81 mg with food daily only for 1 month then Stop 5)follow up with your cardiologist Dr. Londa Rival in 2 to 3 weeks for recheck and reevaluation   Increase activity slowly   Complete by: As directed        Discharge Medications     Allergies as of 10/28/2023       Reactions   Nsaids Other (See Comments)   Per nephrologist        Medication List     TAKE these medications    acetaminophen  500 MG tablet Commonly known as: TYLENOL  Take 500 mg by mouth every 6 (six) hours as needed for mild pain.   albuterol  108 (90 Base) MCG/ACT inhaler Commonly known as: VENTOLIN  HFA Inhale 1-2 puffs into the lungs every 4 (four) hours as needed for wheezing or shortness of breath.   artificial tears ointment Place 2 drops into both eyes 3 (three) times daily.   aspirin  EC 81 MG tablet Take 1 tablet (81 mg total) by mouth daily with breakfast. Swallow whole. Start taking on: Oct 29, 2023   atorvastatin  40 MG tablet Commonly known as: LIPITOR TAKE 1 TABLET BY MOUTH ONCE DAILY AT 6PM   B-D ULTRAFINE III SHORT PEN 31G X 8 MM Misc Generic drug: Insulin  Pen Needle Inject 1 Syringe into the skin daily.   Difluprednate 0.05 % Emul Place 1 drop  into the right eye 3 (three) times daily.   Eliquis  5 MG Tabs tablet Generic drug: apixaban  Take 1 tablet by mouth twice daily   fish oil-omega-3 fatty acids 1000 MG capsule Take 1 g by mouth daily.   fluticasone  50 MCG/ACT nasal spray Commonly known as: FLONASE  Place 1 spray into both nostrils daily.   HAIR/SKIN/NAILS PO Take 1 tablet by mouth daily.   isosorbide  mononitrate 30 MG 24 hr tablet Commonly known as: IMDUR  Take 1/2 (one-half) tablet by mouth once daily   metoprolol  tartrate 50 MG tablet Commonly known as: LOPRESSOR  Take 1 tablet (50 mg total) by mouth 2 (two) times daily. What changed:  medication strength how much to take   milk thistle 175 MG tablet Take 175 mg by mouth daily.   nitroGLYCERIN  0.4 MG SL tablet Commonly known as: NITROSTAT  Place 1 tablet (0.4 mg total) under the tongue every 5 (five) minutes x 3 doses as needed for up to 3 days for chest pain.   ONE TOUCH ULTRA 2 w/Device Kit   Tresiba  FlexTouch 200 UNIT/ML FlexTouch Pen Generic drug: insulin  degludec Inject 18 Units into the skin at bedtime.   VITAMIN B 12 PO Take 1 tablet by mouth daily.   Vitamin D3 1.25 MG (50000 UT) Caps Take 1 capsule by mouth daily.        Major procedures and Radiology Reports - PLEASE review detailed and final reports for all details, in brief -   ECHOCARDIOGRAM COMPLETE Result Date: 10/28/2023    ECHOCARDIOGRAM REPORT   Patient Name:   Glenn Shingler Date of Exam: 10/28/2023 Medical Rec #:  [id number]     Height:       66.0 in Accession #:    [id number]    Weight:       181.2 lb Date of Birth:  [birth year]     BSA:          1.918 m Patient Age:    89 years      BP:  136/92 mmHg Patient Gender: F             HR:           81 bpm. Exam Location:  [facility] Procedure: 2D Echo, Cardiac Doppler and Color Doppler (Both Spectral and Color            Flow Doppler were utilized during procedure). Indications:    Atrial Fibrillation  History:        Patient has  prior history of Echocardiogram examinations.                 NSTEMI, Arrythmias:Atrial Flutter; Risk Factors:Diabetes and                 Hypertension.  Sonographer:    Willey Harrier Referring Phys: [id number] [facility]  Sonographer Comments: Technically difficult study due to poor echo windows. Image acquisition challenging due to respiratory motion. IMPRESSIONS  1. Left ventricular ejection fraction, by estimation, is 60 to 65%. The left ventricle has normal function. The left ventricle has no regional wall motion abnormalities. There is mild left ventricular hypertrophy. Left ventricular diastolic parameters are indeterminate.  2. Right ventricular systolic function is normal. The right ventricular size is normal. There is normal pulmonary artery systolic pressure. The estimated right ventricular systolic pressure is 24.3 mmHg.  3. The mitral valve is normal in structure. Mild mitral valve regurgitation. No evidence of mitral stenosis.  4. Tricuspid valve regurgitation is mild to moderate.  5. The aortic valve is normal in structure. Aortic valve regurgitation is not visualized. No aortic stenosis is present.  6. The inferior vena cava is normal in size with greater than 50% respiratory variability, suggesting right atrial pressure of 3 mmHg. Comparison(s): No significant change from prior study. Prior images reviewed side by side. FINDINGS  Left Ventricle: Left ventricular ejection fraction, by estimation, is 60 to 65%. The left ventricle has normal function. The left ventricle has no regional wall motion abnormalities. The left ventricular internal cavity size was normal in size. There is  mild left ventricular hypertrophy. Left ventricular diastolic parameters are indeterminate. Right Ventricle: The right ventricular size is normal. No increase in right ventricular wall thickness. Right ventricular systolic function is normal. There is normal pulmonary artery systolic pressure. The tricuspid regurgitant  velocity is 2.31 m/s, and  with an assumed right atrial pressure of 3 mmHg, the estimated right ventricular systolic pressure is 24.3 mmHg. Left Atrium: Left atrial size was normal in size. Right Atrium: Right atrial size was normal in size. Pericardium: There is no evidence of pericardial effusion. Mitral Valve: The mitral valve is normal in structure. Mild mitral valve regurgitation. No evidence of mitral valve stenosis. MV peak gradient, 3.3 mmHg. The mean mitral valve gradient is 1.5 mmHg. Tricuspid Valve: The tricuspid valve is normal in structure. Tricuspid valve regurgitation is mild to moderate. No evidence of tricuspid stenosis. Aortic Valve: The aortic valve is normal in structure. Aortic valve regurgitation is not visualized. No aortic stenosis is present. Aortic valve peak gradient measures 3.0 mmHg. Pulmonic Valve: The pulmonic valve was normal in structure. Pulmonic valve regurgitation is trivial. No evidence of pulmonic stenosis. Aorta: The aortic root is normal in size and structure. Venous: The inferior vena cava is normal in size with greater than 50% respiratory variability, suggesting right atrial pressure of 3 mmHg. IAS/Shunts: No atrial level shunt detected by color flow Doppler.  LEFT VENTRICLE PLAX 2D LVIDd:         4.60 cm  LVIDs:         3.60 cm LV PW:         1.30 cm LV IVS:        1.20 cm LVOT diam:     2.00 cm LV SV:         37 LV SV Index:   19 LVOT Area:     3.14 cm  LEFT ATRIUM             Index        RIGHT ATRIUM           Index LA Vol (A2C):   48.3 ml 25.19 ml/m  RA Area:     11.50 cm LA Vol (A4C):   26.7 ml 13.92 ml/m  RA Volume:   20.60 ml  10.74 ml/m LA Biplane Vol: 36.6 ml 19.09 ml/m  AORTIC VALVE AV Area (Vmax): 1.84 cm AV Vmax:        86.07 cm/s AV Peak Grad:   3.0 mmHg LVOT Vmax:      50.47 cm/s LVOT Vmean:     37.900 cm/s LVOT VTI:       0.119 m  AORTA Ao Root diam: 3.00 cm Ao Asc diam:  3.30 cm MITRAL VALVE               TRICUSPID VALVE MV Area (PHT): 2.36 cm    TR  Peak grad:   21.3 mmHg MV Area VTI:   1.75 cm    TR Vmax:        231.00 cm/s MV Peak grad:  3.3 mmHg MV Mean grad:  1.5 mmHg    SHUNTS MV Vmax:       0.90 m/s    Systemic VTI:  0.12 m MV Vmean:      54.6 cm/s   Systemic Diam: 2.00 cm MV Decel Time: 321 msec MV E velocity: 59.43 cm/s Dorothye Gathers MD Electronically signed by Dorothye Gathers MD Signature Date/Time: 10/28/2023/1:47:37 PM    Final    DG Chest Portable 1 View Result Date: 10/27/2023 CLINICAL DATA:  Chest pain EXAM: PORTABLE CHEST 1 VIEW COMPARISON:  None Available. FINDINGS: The heart size and mediastinal contours are within normal limits. Both lungs are clear. The visualized skeletal structures are unremarkable. IMPRESSION: No active disease. Electronically Signed   By: Worthy Heads M.D.   On: 10/27/2023 21:58    Today   Subjective    Lynasia Meloche today has no new complaints -        Patient's daughter Carles Cheadle and granddaughter at bedside, questions answered  Patient has been seen and examined prior to discharge   Objective   Blood pressure 96/61, pulse 79, temperature 97.8 F (36.6 C), temperature source Oral, resp. rate 20, height 5\' 6"  (1.676 m), weight 82.2 kg, SpO2 95%.   Intake/Output Summary (Last 24 hours) at 10/28/2023 1409 Last data filed at 10/28/2023 1032 Gross per 24 hour  Intake 0 ml  Output --  Net 0 ml    Exam Gen:- Awake Alert, no acute distress  HEENT:- [place].AT, No sclera icterus Neck-Supple Neck,No JVD,.  Lungs-  CTAB , good air movement bilaterally CV- S1, S2 normal, regular, consistently reproducible left-sided chest wall tenderness Abd-  +ve B.Sounds, Abd Soft, No tenderness,    Extremity/Skin:- No  edema,   good pulses Psych-affect is appropriate, oriented x3 Neuro-no new focal deficits, no tremors    Data Review   CBC w Diff:  Lab Results  Component Value Date  WBC 8.9 10/28/2023   HGB 12.9 10/28/2023   HCT 38.9 10/28/2023   PLT 156 10/28/2023   LYMPHOPCT 25 10/27/2023   MONOPCT 8  10/27/2023   EOSPCT 1 10/27/2023   BASOPCT 0 10/27/2023    CMP:  Lab Results  Component Value Date   NA 139 10/28/2023   K 3.6 10/28/2023   CL 104 10/28/2023   CO2 27 10/28/2023   BUN 27 (H) 10/28/2023   CREATININE 1.14 (H) 10/28/2023   CREATININE 1.30 (H) 01/15/2013   PROT 6.2 (L) 10/28/2023   ALBUMIN 3.4 (L) 10/28/2023   BILITOT 1.1 10/28/2023   ALKPHOS 58 10/28/2023   AST 42 (H) 10/28/2023   ALT 38 10/28/2023  .  Total Discharge time is about 33 minutes  Colin Dawley M.D on 10/28/2023 at 2:09 PM  Go to [email or web address] -  for contact info  [facility] - Office  [phone]

## 2023-10-28 NOTE — Plan of Care (Signed)
  Problem: Education: Goal: Knowledge of General Education information will improve Description: Including pain rating scale, medication(s)/side effects and non-pharmacologic comfort measures Outcome: Progressing   Problem: Health Behavior/Discharge Planning: Goal: Ability to manage health-related needs will improve Outcome: Progressing   Problem: Clinical Measurements: Goal: Ability to maintain clinical measurements within normal limits will improve Outcome: Progressing Goal: Will remain free from infection Outcome: Progressing Goal: Diagnostic test results will improve Outcome: Progressing   Problem: Activity: Goal: Risk for activity intolerance will decrease Outcome: Progressing   Problem: Pain Managment: Goal: General experience of comfort will improve and/or be controlled Outcome: Progressing   Problem: Safety: Goal: Ability to remain free from injury will improve Outcome: Progressing   Problem: Skin Integrity: Goal: Risk for impaired skin integrity will decrease Outcome: Progressing

## 2023-10-28 NOTE — Care Management Obs Status (Signed)
 MEDICARE OBSERVATION STATUS NOTIFICATION   Patient Details  Name: Audrey Walker MRN: [id number] Date of Birth: [birth year]   Medicare Observation Status Notification Given:   Yes    Lynda Sands, RN 10/28/2023, 8:03 AM

## 2023-10-28 NOTE — Progress Notes (Signed)
 Date and time results received: 10/28/23 1237  Test: troponin  Critical Value: 235  Name of Provider Notified: Courage Emokpae  Orders Received? Or Actions Taken?: n/a  Estle Hemp, RN

## 2023-10-31 ENCOUNTER — Ambulatory Visit: Admitting: Medical

## 2023-11-02 ENCOUNTER — Telehealth: Payer: Self-pay | Admitting: Cardiology

## 2023-11-02 NOTE — Telephone Encounter (Signed)
 Pt c/o medication issue:  1. Name of Medication:   metoprolol  tartrate (LOPRESSOR ) 25 MG tablet   2. How are you currently taking this medication (dosage and times per day)?   3. Are you having a reaction (difficulty breathing--STAT)?   4. What is your medication issue?   Daughter Carles Cheadle) stated patient was prescribed 50 mg of this medication when she was in hospital and was told she could take additional medication as need.  Daughter wants a call back to clarify if patient can take the 50 mg dosage or will she need to cut the tablet in half.

## 2023-11-03 NOTE — Telephone Encounter (Signed)
 Returned call to [place] daughter. No answer. Left msg to call back.

## 2023-11-03 NOTE — Telephone Encounter (Signed)
 Thank you for clarification.  That plan is reasonable - 25 BID and extra 25 as needed.

## 2023-11-03 NOTE — Telephone Encounter (Signed)
 Received clarification from pts daughter. Daughter stated that Dr. Josiah Nigh decided not to change Metoprolol  to 50 mg. Provider told pt to continue 25 mg BID and take extra tablet for HR greater than 120 bpm.

## 2023-11-03 NOTE — Telephone Encounter (Signed)
 Spoke to patients daughter who verbalized understanding of resuming Metoprolol  at current dosage with PRN 25 mg tablet for elevated heart rates. Daughter had no further questions or concerns at this time.

## 2023-11-03 NOTE — Telephone Encounter (Signed)
 Per Hospitalist note on 5/17:  Metoprolol  increased to 50 mg BID  Continue metoprolol  for rate control, may take additional metoprolol  25 mg x 1 if heart rate greater than 120 as long as systolic BP is greater than 100    Please advise if patient needs to keep 50 mg BID or 25 mg.

## 2023-11-22 ENCOUNTER — Encounter (HOSPITAL_COMMUNITY): Payer: Self-pay | Admitting: Occupational Therapy

## 2023-11-22 ENCOUNTER — Ambulatory Visit (HOSPITAL_COMMUNITY): Attending: Family Medicine | Admitting: Occupational Therapy

## 2023-11-22 DIAGNOSIS — R29898 Other symptoms and signs involving the musculoskeletal system: Secondary | ICD-10-CM | POA: Diagnosis not present

## 2023-11-22 NOTE — Addendum Note (Signed)
 Addended by: Adyen Bifulco A on: 11/22/2023 04:03 PM   Modules accepted: Orders

## 2023-11-22 NOTE — Therapy (Addendum)
 OUTPATIENT OCCUPATIONAL THERAPY ORTHO RE-EVALUATION DISCHARGE SUMMARY  Patient Name: Audrey Walker MRN: [id number] DOB:1933-04-14, 89 y.o., female Today's Date: 11/22/2023  OCCUPATIONAL THERAPY DISCHARGE SUMMARY  Visits from Start of Care: 2  Current functional level related to goals / functional outcomes: See below. Pt has met all goals and is pleased with the current level of functioning.    Remaining deficits: Sensation and strength deficits impacting coordination and grasp   Education / Equipment: HEP    Patient agrees to discharge. Patient goals were met. Patient is being discharged due to meeting the stated rehab goals..      END OF SESSION:  OT End of Session - 11/22/23 1545     Visit Number 2    Number of Visits 4    Date for OT Re-Evaluation 11/23/23    Authorization Type [employer], $15 copay    Authorization Time Period no auth required    Progress Note Due on Visit 10    OT Start Time 1515    OT Stop Time 1557    OT Time Calculation (min) 42 min    Activity Tolerance Patient tolerated treatment well    Behavior During Therapy WFL for tasks assessed/performed              Past Medical History:  Diagnosis Date   Atrial flutter (HCC)    CKD (chronic kidney disease) stage 2, GFR 60-89 ml/min    Essential hypertension, benign    Type 2 diabetes mellitus (HCC)    Past Surgical History:  Procedure Laterality Date   APPENDECTOMY     CORNEAL TRANSPLANT Right    EYE SURGERY     GALLBLADDER SURGERY     Patient Active Problem List   Diagnosis Date Noted   Chest pain 10/28/2023   Elevated troponin 10/28/2023   Mixed hyperlipidemia 10/28/2023   PAF (paroxysmal atrial fibrillation) (HCC)    AKI (acute kidney injury) (HCC)    NSTEMI (non-ST elevated myocardial infarction) (HCC) 03/21/2022   CKD (chronic kidney disease) stage 3, GFR 30-59 ml/min (HCC) 03/21/2022   DNR (do not resuscitate) 03/21/2022   Hyponatremia 06/01/2020   COVID-19  virus infection 06/01/2020   Type 2 diabetes mellitus (HCC) 04/29/2013   Atrial flutter (HCC) 04/29/2013   Essential hypertension, benign 04/29/2013   PCP: Dr. Denman Fischer REFERRING PROVIDER: Wendi Ham, NP  ONSET DATE: chronic, ~1 year ago  REFERRING DIAG: difficulty gripping  THERAPY DIAG:  Other symptoms and signs involving the musculoskeletal system - Plan: Ot plan of care cert/re-cert  Rationale for Evaluation and Treatment: Rehabilitation  SUBJECTIVE:   SUBJECTIVE STATEMENT: S: I do those exercises at least once every day.   PERTINENT HISTORY: Pt is a 89 year old female presenting with bilateral hand weakness. Pt reports her left hand is weaker than her right and has been weak for approximately 12 months. Pt reports her left hand will try to draw up on her occasionally.   PRECAUTIONS: None  WEIGHT BEARING RESTRICTIONS: No  PAIN:  Are you having pain? No  FALLS: Has patient fallen in last 6 months? No  PLOF: Independent with basic ADLs  PATIENT GOALS: To be stronger in her hands.   NEXT MD VISIT: unsure  OBJECTIVE:  Note: Objective measures were completed at Evaluation unless otherwise noted.  HAND DOMINANCE: Right  ADLs: Pt reports difficulty grasping objects, does not trust the left hand to carry objects due to dropping items. Pt has difficulty with cutting food, squeezing a washcloth out, holding  utensils is getting more difficult.   FUNCTIONAL OUTCOME MEASURES: Quick Dash: 36.36 6/11: 27.27  UPPER EXTREMITY ROM:        BUE ROM is WNL   UPPER EXTREMITY MMT:     MMT Right eval Left eval  Wrist flexion 5/5 5/5  Wrist extension 5/5 5/5  Wrist ulnar deviation 5/5 5/5  Wrist radial deviation 5/5 5/5  (Blank rows = not tested)  HAND FUNCTION: Grip strength: Right: 26 lbs; Left: 18 lbs, Lateral pinch: Right: 8 lbs, Left: 2 lbs, and 3 point pinch: Right: 5 lbs, Left: 2 lbs 11/22/23: Right: 31 lbs; Left 30 lbs; Lateral pinch: Right 10, Left 7 lbs;  and 3 point pinch: Right 6 lbs, Left: 8 lbs  COORDINATION: 9 Hole Peg test: Right: 1' 15 sec; Left: 1' 15 sec *Vision impacting speed* 11/22/23: Right: 1' 56; Left: 1' 10  *Vision and sensation deficits impacting speed*     TREATMENT DATE:  11/22/23 -Theraputty: red-flatten, using pvc pipe to cut circles into the putty -Pinch strengthening: pt using 3 point pinch with green and red clothespins to grasp and place 20 sponges into bucket. Completed with right and left hand, 20 sponges each  -Sponges: 23, bilateral hands  Eval -Educated on AE available to improve success with ADLs  -Theraputty: yellow-roll into a ball, flatten, roll into a log, pinch, grip                                                                                                                              PATIENT EDUCATION: Education details: Upgraded putty to red resistance level Person educated: Patient Education method: Explanation, Demonstration, and Handouts Education comprehension: verbalized understanding and returned demonstration  HOME EXERCISE PROGRAM: Eval: AE for ADLs, yellow theraputty grip strengthening  GOALS: Goals reviewed with patient? Yes  SHORT TERM GOALS: Target date: 11/10/23  Pt will be provided with and educated on HEP to improve bilateral grip strength required for ADL completion.  Goal status: MET  2.  Pt will increase bilateral grip strength by 5# and pinch strength by 3# to increase ability to cut food during meal preparation and self-feeding.    Goal status: MET  3.  Pt will be educated on AE available to improve independence and success in ADL completion.   Goal status: MET  4.  Pt will improve bilateral fine motor coordination by completing 9 hole peg test in under 1', to improve success with tedious tasks such as medication management.   Goal status: NOT MET   ASSESSMENT:  CLINICAL IMPRESSION: Reassessment completed this session as pt has not been seen since  the initial evaluation. Pt reports she has been completing her HEP at least once per day and feels that her hands are stronger. Pt demonstrating improved grip and pinch strength in bilateral hands, significantly improved in the left hand. Pt with slower time using right hand for 9 hole peg test secondary to sensation deficits in fingers  limiting ability to successfully grasp the pegs versus a true coordination deficit. Left hand coordination did improve in the testing. Discussed findings with pt who is agreeable to discharge today. Pt is pleased with her level of functioning and is aware of various AE available if she decides she is interested.   PERFORMANCE DEFICITS: in functional skills including ADLs, IADLs, coordination, dexterity, strength, and UE functional use       PLAN:  OT FREQUENCY: 1x/week  OT DURATION: 4 weeks  PLANNED INTERVENTIONS: 97168 OT Re-evaluation, 97535 self care/ADL training, 57846 therapeutic exercise, 97530 therapeutic activity, 97112 neuromuscular re-education, patient/family education, and DME and/or AE instructions  CONSULTED AND AGREED WITH PLAN OF CARE: Patient  PLAN FOR NEXT SESSION: N/A-discharge today   Lafonda Piety, OTR/L  [phone] 11/22/2023, 4:03 PM

## 2023-12-12 DIAGNOSIS — E11319 Type 2 diabetes mellitus with unspecified diabetic retinopathy without macular edema: Secondary | ICD-10-CM | POA: Diagnosis not present

## 2023-12-12 DIAGNOSIS — Z947 Corneal transplant status: Secondary | ICD-10-CM | POA: Diagnosis not present

## 2023-12-12 NOTE — Progress Notes (Unsigned)
 Cardiology Office Note:  .   Date:  12/13/2023  ID:  Audrey Walker, DOB 03-16-1933, MRN [id number] PCP: Shona Norleen PEDLAR, MD  [facility] Providers Cardiologist:  Jayson Sierras, MD { History of Present Illness: .   Audrey Walker is a 89 y.o. female  with PMHx of paroxysmal A-fib/flutter on Eliquis , CAD (NSTEMI in 03/2022 that was medically managed), HLD, HTN, DM 2, CKD S3 reports to [facility] office for follow up.   Last seen in heartcare OV 02/2023 with Cadence Franchester, PA-C for follow-up.  Reports chronic unchanged LE edema and rare palpitations at night.  Overall doing well and denied any other cardiac complaints.  No med changes.  Requested recent labs from PCP, which have not been received.   Most recent hospitalization at [facility] 5/16-17/25 for chest pain and found to be in A-fib with RVR, treated with IV Lopressor . CP described as a heavy sensation on the left side of chest, initially 8/10 but reduced to 1/10 after NTG X3.  Noted elevated troponin (156>235) due to type II demand ischemia. Cardiologist Dr. Jeffrie was consulted and recommended medical management.  Ordered ASA 81 mg x1 month and continued Eliquis  regimen. Continued Lopressor , atorvastatin  and Imdur .  Recommended if HR > 120 and SBP >100, then can take additional metoprolol  25 mg.   Today, reports worsening LE edema x 4-6 weeks with some relief from leg elevation. denies chest pain, shortness of breath, palpitations, syncope, presyncope, dizziness, orthopnea, PND, acute bleeding, or claudication.   Reports compliance with medications. Denies tobacco use/Binging ETOH/drug use. She is able to clean the house and cook for herself without any concerns. She lives by herself and can complete her own ADLs.  She uses a cane or walker for ambulation assistance.   ROS: 10 point review of system has been reviewed and considered negative except ones been listed in the HPI.   Studies Reviewed: SABRA   EKG Interpretation - Reviewed EKG  with Dr. Sierras Date/Time:  Wednesday December 13 2023 15:11:24 EDT Ventricular Rate:  71 PR Interval:    QRS Duration:  70 QT Interval:  398 QTC Calculation: 432 R Axis:   -3  Text Interpretation: Normal sinus rhythm Premature ventricular complexes Premature atrial complexes Low voltage QRS When compared with ECG of 27-Oct-2023 23:08, PREVIOUS ECG IS PRESENT Confirmed by Sheron Hallmark ([id number]) on 12/13/2023 4:11:14 PM   Lexiscan  08/2012 Impression Exercise Capacity:  Lexiscan  with no exercise. BP Response:  Normal blood pressure response. Clinical Symptoms:  No significant symptoms noted. ECG Impression:  No significant ECG changes with Lexiscan . Comparison with Prior Nuclear Study: No images to compare Overall Impression:  Normal stress nuclear study. LV Wall Motion:  NL LV Function; NL Wall Motion   ECHO IMPRESSIONS 10/2023   1. Left ventricular ejection fraction, by estimation, is 60 to 65%. The  left ventricle has normal function. The left ventricle has no regional  wall motion abnormalities. There is mild left ventricular hypertrophy.  Left ventricular diastolic parameters  are indeterminate.   2. Right ventricular systolic function is normal. The right ventricular  size is normal. There is normal pulmonary artery systolic pressure. The  estimated right ventricular systolic pressure is 24.3 mmHg.   3. The mitral valve is normal in structure. Mild mitral valve  regurgitation. No evidence of mitral stenosis.   4. Tricuspid valve regurgitation is mild to moderate.   5. The aortic valve is normal in structure. Aortic valve regurgitation is  not visualized. No aortic stenosis  is present.   6. The inferior vena cava is normal in size with greater than 50%  respiratory variability, suggesting right atrial pressure of 3 mmHg.   Comparison(s): No significant change from prior study. Prior images  reviewed side by side.  Risk Assessment/Calculations:    CHA2DS2-VASc Score = 5    This indicates a 7.2% annual risk of stroke. The patient's score is based upon: CHF History: 0 HTN History: 1 Diabetes History: 0 Stroke History: 0 Vascular Disease History: 1 Age Score: 2 Gender Score: 1  Physical Exam:   VS:  BP (!) 124/44   Pulse 71   Ht 5' 6.5 (1.689 m)   Wt 181 lb (82.1 kg)   SpO2 95%   BMI 28.78 kg/m    Wt Readings from Last 3 Encounters:  12/13/23 181 lb (82.1 kg)  10/28/23 181 lb 3.2 oz (82.2 kg)  03/07/23 182 lb 3.2 oz (82.6 kg)    GEN: Well nourished, well developed in no acute distress while sitting in chair. Accompanied by daughter.  NECK: No JVD; No carotid bruits CARDIAC: RRR, no murmurs, rubs, gallops RESPIRATORY:  Clear to auscultation without rales, wheezing or rhonchi  ABDOMEN: Soft, non-tender, non-distended EXTREMITIES:  Trace edema bilaterally; No deformity   ASSESSMENT AND PLAN: .   Paroxysmal atrial fibrillation/Atrial Flutter  Recent hospitalization in 10/2023 found to be in A-fib with RVR.  Continued on Lopressor . Recommended if HR > 120 and SBP >100, then can take additional metoprolol  25 mg.  10/2023: CBC WNL EKG this visit looks similar to previous tracing suggesting atrial fibrillation, however EKG was reviewed with Dr. Debera who interpreted it as NSR with PAC and PVC.  Denies any palpitations or active bleeding. Continue on Lopressor  25 mg BID Continue on Eliquis  5 mg, which is the appropriate dose given age, weight and renal function.  If recurrent A fib then patient stated that she is not interested in DCCV, can consider discussing amiodarone if necessary.   LE edema  ECHO 10/2023: EF 60-65%, mild LVH, mild MVR Reports worsening LE edema x 4-6 weeks with some relief from leg elevation. Denied any other HF symptoms.  Trace edema bilaterally is noted on exam.  Order compression socks.  Order Lasix 20 mg x 3 day then PRN for swelling.   Coronary artery disease involving native coronary artery of native heart without angina  pectoris Mixed hyperlipidemia NSTEMI in October 2023 that was medically managed.  Echo at that time showed normal pump function W/O RWMA.  Denies any exertional symptoms 2023: LDL 94; 10/2023: AST/ALT WNL; Request labs from PCP. Will need FLP if not included.  Discontinue ASA 81 mg  Continue on Lopressor , Imdur  30 mg daily, Lipitor 40 mg daily, sublingual NTG  HTN  BP this office visit, well-controlled: 124/44 Continue on Lopressor   as above    Dispo: Follow up in 3 months   Signed, Lorette CINDERELLA Kapur, PA-C

## 2023-12-13 ENCOUNTER — Ambulatory Visit: Payer: Self-pay | Attending: Student | Admitting: Physician Assistant

## 2023-12-13 ENCOUNTER — Encounter: Payer: Self-pay | Admitting: *Deleted

## 2023-12-13 ENCOUNTER — Encounter: Payer: Self-pay | Admitting: Physician Assistant

## 2023-12-13 VITALS — BP 124/44 | HR 71 | Ht 66.5 in | Wt 181.0 lb

## 2023-12-13 DIAGNOSIS — E782 Mixed hyperlipidemia: Secondary | ICD-10-CM | POA: Diagnosis not present

## 2023-12-13 DIAGNOSIS — I480 Paroxysmal atrial fibrillation: Secondary | ICD-10-CM | POA: Diagnosis not present

## 2023-12-13 DIAGNOSIS — I4892 Unspecified atrial flutter: Secondary | ICD-10-CM

## 2023-12-13 DIAGNOSIS — I2510 Atherosclerotic heart disease of native coronary artery without angina pectoris: Secondary | ICD-10-CM | POA: Diagnosis not present

## 2023-12-13 DIAGNOSIS — R600 Localized edema: Secondary | ICD-10-CM | POA: Diagnosis not present

## 2023-12-13 DIAGNOSIS — I10 Essential (primary) hypertension: Secondary | ICD-10-CM | POA: Diagnosis not present

## 2023-12-13 MED ORDER — FUROSEMIDE 20 MG PO TABS: 20.0000 mg | ORAL_TABLET | Freq: Every day | ORAL | 11 refills | Status: AC | PRN

## 2023-12-13 MED ORDER — MEDICAL COMPRESSION SOCKS MISC: 1.0000 [IU] | 0 refills | Status: AC

## 2023-12-13 NOTE — Patient Instructions (Addendum)
 Medication Instructions:  Your physician has recommended you make the following change in your medication:   Start Lasix 20 mg daily for 3 days then take daily as needed for swelling.   Stop Taking Aspirin    *If you need a refill on your cardiac medications before your next appointment, please call your pharmacy*  Lab Work: NONE   If you have labs (blood work) drawn today and your tests are completely normal, you will receive your results only by: MyChart Message (if you have MyChart) OR A paper copy in the mail If you have any lab test that is abnormal or we need to change your treatment, we will call you to review the results.  Testing/Procedures: NONE   Follow-Up: At [facility], you and your health needs are our priority.  As part of our continuing mission to provide you with exceptional heart care, our providers are all part of one team.  This team includes your primary Cardiologist (physician) and Advanced Practice Providers or APPs (Physician Assistants and Nurse Practitioners) who all work together to provide you with the care you need, when you need it.  Your next appointment:   3 month(s)  Provider:   You may see Jayson Sierras, MD or one of the following Advanced Practice Providers on your designated Care Team:   Laymon Qua, PA-C  [place], [place] Olivia Pavy, [place]     We recommend signing up for the patient portal called MyChart.  Sign up information is provided on this After Visit Summary.  MyChart is used to connect with patients for Virtual Visits (Telemedicine).  Patients are able to view lab/test results, encounter notes, upcoming appointments, etc.  Non-urgent messages can be sent to your provider as well.   To learn more about what you can do with MyChart, go to [email or web address].   Other Instructions Thank you for choosing [facility]!

## 2023-12-23 ENCOUNTER — Other Ambulatory Visit: Payer: Self-pay | Admitting: Cardiology

## 2023-12-23 DIAGNOSIS — I4892 Unspecified atrial flutter: Secondary | ICD-10-CM

## 2023-12-25 NOTE — Telephone Encounter (Signed)
 Prescription refill request for Eliquis  received. Indication: Afib  Last office visit: 12/13/23 Rock[place])  Scr: 1.14 (10/28/23)  Age: 89 Weight: 82.1kg  Appropriate dose. Refill sent.

## 2024-01-04 DIAGNOSIS — L851 Acquired keratosis [keratoderma] palmaris et plantaris: Secondary | ICD-10-CM | POA: Diagnosis not present

## 2024-01-04 DIAGNOSIS — E1151 Type 2 diabetes mellitus with diabetic peripheral angiopathy without gangrene: Secondary | ICD-10-CM | POA: Diagnosis not present

## 2024-01-04 DIAGNOSIS — M79675 Pain in left toe(s): Secondary | ICD-10-CM | POA: Diagnosis not present

## 2024-01-04 DIAGNOSIS — M79674 Pain in right toe(s): Secondary | ICD-10-CM | POA: Diagnosis not present

## 2024-01-04 DIAGNOSIS — B351 Tinea unguium: Secondary | ICD-10-CM | POA: Diagnosis not present

## 2024-01-26 DIAGNOSIS — J069 Acute upper respiratory infection, unspecified: Secondary | ICD-10-CM | POA: Diagnosis not present

## 2024-02-15 DIAGNOSIS — I10 Essential (primary) hypertension: Secondary | ICD-10-CM | POA: Diagnosis not present

## 2024-02-15 DIAGNOSIS — E1122 Type 2 diabetes mellitus with diabetic chronic kidney disease: Secondary | ICD-10-CM | POA: Diagnosis not present

## 2024-02-21 DIAGNOSIS — N1831 Chronic kidney disease, stage 3a: Secondary | ICD-10-CM | POA: Diagnosis not present

## 2024-02-21 DIAGNOSIS — I10 Essential (primary) hypertension: Secondary | ICD-10-CM | POA: Diagnosis not present

## 2024-02-21 DIAGNOSIS — I129 Hypertensive chronic kidney disease with stage 1 through stage 4 chronic kidney disease, or unspecified chronic kidney disease: Secondary | ICD-10-CM | POA: Diagnosis not present

## 2024-02-21 DIAGNOSIS — I482 Chronic atrial fibrillation, unspecified: Secondary | ICD-10-CM | POA: Diagnosis not present

## 2024-02-21 DIAGNOSIS — M25561 Pain in right knee: Secondary | ICD-10-CM | POA: Diagnosis not present

## 2024-02-21 DIAGNOSIS — E875 Hyperkalemia: Secondary | ICD-10-CM | POA: Diagnosis not present

## 2024-02-21 DIAGNOSIS — E1122 Type 2 diabetes mellitus with diabetic chronic kidney disease: Secondary | ICD-10-CM | POA: Diagnosis not present

## 2024-02-21 DIAGNOSIS — E1165 Type 2 diabetes mellitus with hyperglycemia: Secondary | ICD-10-CM | POA: Diagnosis not present

## 2024-02-21 DIAGNOSIS — E870 Hyperosmolality and hypernatremia: Secondary | ICD-10-CM | POA: Diagnosis not present

## 2024-02-21 DIAGNOSIS — R808 Other proteinuria: Secondary | ICD-10-CM | POA: Diagnosis not present

## 2024-02-21 DIAGNOSIS — M542 Cervicalgia: Secondary | ICD-10-CM | POA: Diagnosis not present

## 2024-02-27 DIAGNOSIS — T86891 Other transplanted tissue failure: Secondary | ICD-10-CM | POA: Diagnosis not present

## 2024-02-27 DIAGNOSIS — Z947 Corneal transplant status: Secondary | ICD-10-CM | POA: Diagnosis not present

## 2024-03-04 DIAGNOSIS — E1122 Type 2 diabetes mellitus with diabetic chronic kidney disease: Secondary | ICD-10-CM | POA: Diagnosis not present

## 2024-03-11 DIAGNOSIS — Z713 Dietary counseling and surveillance: Secondary | ICD-10-CM | POA: Diagnosis not present

## 2024-03-11 DIAGNOSIS — E1122 Type 2 diabetes mellitus with diabetic chronic kidney disease: Secondary | ICD-10-CM | POA: Diagnosis not present

## 2024-03-14 DIAGNOSIS — M79674 Pain in right toe(s): Secondary | ICD-10-CM | POA: Diagnosis not present

## 2024-03-14 DIAGNOSIS — E1151 Type 2 diabetes mellitus with diabetic peripheral angiopathy without gangrene: Secondary | ICD-10-CM | POA: Diagnosis not present

## 2024-03-14 DIAGNOSIS — L851 Acquired keratosis [keratoderma] palmaris et plantaris: Secondary | ICD-10-CM | POA: Diagnosis not present

## 2024-03-14 DIAGNOSIS — B351 Tinea unguium: Secondary | ICD-10-CM | POA: Diagnosis not present

## 2024-03-14 DIAGNOSIS — M79675 Pain in left toe(s): Secondary | ICD-10-CM | POA: Diagnosis not present

## 2024-03-19 ENCOUNTER — Other Ambulatory Visit: Payer: Self-pay | Admitting: Cardiology

## 2024-03-26 DIAGNOSIS — Z713 Dietary counseling and surveillance: Secondary | ICD-10-CM | POA: Diagnosis not present

## 2024-03-26 DIAGNOSIS — E1122 Type 2 diabetes mellitus with diabetic chronic kidney disease: Secondary | ICD-10-CM | POA: Diagnosis not present

## 2024-03-26 DIAGNOSIS — Z794 Long term (current) use of insulin: Secondary | ICD-10-CM | POA: Diagnosis not present

## 2024-03-29 ENCOUNTER — Telehealth: Payer: Self-pay | Admitting: Cardiology

## 2024-03-29 MED ORDER — NITROGLYCERIN 0.4 MG SL SUBL: 0.4000 mg | SUBLINGUAL_TABLET | SUBLINGUAL | 2 refills | Status: AC | PRN

## 2024-03-29 NOTE — Telephone Encounter (Signed)
*  STAT* If patient is at the pharmacy, call can be transferred to refill team.   1. Which medications need to be refilled? (please list name of each medication and dose if known) nitroGLYCERIN  (NITROSTAT ) 0.4 MG SL tablet   2. Which pharmacy/location (including street and city if local pharmacy) is medication to be sent to? [facility] [address], [place] - [address]  3. Do they need a 30 day or 90 day supply?  Standard supply + refills if possible.  Patient says she is completely out of nitroglycerin .

## 2024-03-29 NOTE — Telephone Encounter (Signed)
 Refill sent.

## 2024-04-03 DIAGNOSIS — E1122 Type 2 diabetes mellitus with diabetic chronic kidney disease: Secondary | ICD-10-CM | POA: Diagnosis not present

## 2024-04-04 ENCOUNTER — Telehealth: Payer: Self-pay | Admitting: Cardiology

## 2024-04-04 NOTE — Telephone Encounter (Signed)
 Returned call to daughter. All questions answered if any. Reminded to appt tomorrow with Dr. Debera.

## 2024-04-04 NOTE — Telephone Encounter (Signed)
 Daughter Alvester) stated she was returning RN Lukisha's call.

## 2024-04-05 ENCOUNTER — Encounter: Payer: Self-pay | Admitting: Cardiology

## 2024-04-05 ENCOUNTER — Ambulatory Visit: Attending: Cardiology | Admitting: Cardiology

## 2024-04-05 VITALS — BP 140/60 | HR 72 | Ht 66.5 in | Wt 184.0 lb

## 2024-04-05 DIAGNOSIS — I480 Paroxysmal atrial fibrillation: Secondary | ICD-10-CM | POA: Diagnosis not present

## 2024-04-05 DIAGNOSIS — E782 Mixed hyperlipidemia: Secondary | ICD-10-CM

## 2024-04-05 DIAGNOSIS — I259 Chronic ischemic heart disease, unspecified: Secondary | ICD-10-CM

## 2024-04-05 NOTE — Progress Notes (Signed)
 Cardiology Office Note  Date: 04/05/2024   ID: Audrey Walker, DOB [birth year], MRN [id number]  History of Present Illness: Audrey Walker is an 89 y.o. female last seen in July by Ms. Sheron RIGGERS, I reviewed her note.  She is here today with family member for a follow-up visit.  She does not report any progressive sense of palpitations, no reproducible exertional chest pain.  I reviewed her medications.  She reports compliance with current cardiac regimen, no spontaneous bleeding problems on Eliquis .  She has not had to use any nitroglycerin .  I reviewed her echocardiogram from May and also interval lab work from September.  Physical Exam: VS:  BP (!) 140/60 (BP Location: Left Arm, Patient Position: Sitting, Cuff Size: Normal)   Pulse 72   Ht 5' 6.5 (1.689 m)   Wt 184 lb (83.5 kg) Comment: reported by patient  SpO2 96%   BMI 29.25 kg/m , BMI Body mass index is 29.25 kg/m.  Wt Readings from Last 3 Encounters:  04/05/24 184 lb (83.5 kg)  12/13/23 181 lb (82.1 kg)  10/28/23 181 lb 3.2 oz (82.2 kg)    General: Patient appears comfortable at rest. HEENT: Conjunctiva and lids normal. Neck: Supple, no elevated JVP or carotid bruits. Lungs: Clear to auscultation, nonlabored breathing at rest. Cardiac: Regular rate and rhythm, 2/6 systolic murmur without gallop. Extremities: No pitting edema.  ECG:  An ECG dated 7-25 was personally reviewed today and demonstrated:  Sinus rhythm with PVCs, low voltage.  Labwork: 10/27/2023: B Natriuretic Peptide 93.0 10/28/2023: ALT 38; AST 42; BUN 27; Creatinine, Ser 1.14; Hemoglobin 12.9; Magnesium  1.6; Platelets 156; Potassium 3.6; Sodium 139     Component Value Date/Time   CHOL 171 03/22/2022 0508   TRIG 48 03/22/2022 0508   HDL 67 03/22/2022 0508   CHOLHDL 2.6 03/22/2022 0508   VLDL 10 03/22/2022 0508   LDLCALC 94 03/22/2022 0508  September 2025: Hemoglobin 11.7, platelets 187, BUN 29, creatinine 1.03, potassium 4.6, AST 40, ALT 41, GFR  52, HDL 57, triglycerides 49, LDL 33, hemoglobin A1c 6.7%  Other Studies Reviewed Today:  Echocardiogram 10/28/2023:  1. Left ventricular ejection fraction, by estimation, is 60 to 65%. The  left ventricle has normal function. The left ventricle has no regional  wall motion abnormalities. There is mild left ventricular hypertrophy.  Left ventricular diastolic parameters  are indeterminate.   2. Right ventricular systolic function is normal. The right ventricular  size is normal. There is normal pulmonary artery systolic pressure. The  estimated right ventricular systolic pressure is 24.3 mmHg.   3. The mitral valve is normal in structure. Mild mitral valve  regurgitation. No evidence of mitral stenosis.   4. Tricuspid valve regurgitation is mild to moderate.   5. The aortic valve is normal in structure. Aortic valve regurgitation is  not visualized. No aortic stenosis is present.   6. The inferior vena cava is normal in size with greater than 50%  respiratory variability, suggesting right atrial pressure of 3 mmHg.   Assessment and Plan:  1.  Paroxysmal atrial fibrillation/flutter with CHA2DS2-VASc score of 7.  Fairly asymptomatic in terms of palpitations.  She continues on Lopressor  25 mg twice daily and Eliquis  5 mg twice daily.  I reviewed her lab work from September.  Continue with current plan.   2.  Mixed hyperlipidemia.  LDL 33 in September.  Continue Lipitor 40 mg daily.   3.  Ischemic heart disease with medically managed NSTEMI in October 2023.  Follow-up echocardiogram in May shows LVEF 60 to 65% with no regional wall motion abnormalities.  No recurring angina.  Continue Imdur  15 mg daily and Lipitor 40 mg daily.  She has as needed nitroglycerin  available.  Disposition:  Follow up 6 months.  Signed, Jayson JUDITHANN Sierras, M.D., F.A.C.C. [facility] at [facility]

## 2024-04-05 NOTE — Patient Instructions (Signed)
 Medication Instructions:  Your physician recommends that you continue on your current medications as directed. Please refer to the Current Medication list given to you today.   Labwork: None today  Testing/Procedures: None today  Follow-Up: 6 months  Any Other Special Instructions Will Be Listed Below (If Applicable).  If you need a refill on your cardiac medications before your next appointment, please call your pharmacy.

## 2024-04-08 DIAGNOSIS — E1165 Type 2 diabetes mellitus with hyperglycemia: Secondary | ICD-10-CM | POA: Diagnosis not present

## 2024-04-11 DIAGNOSIS — E1122 Type 2 diabetes mellitus with diabetic chronic kidney disease: Secondary | ICD-10-CM | POA: Diagnosis not present

## 2024-05-04 DIAGNOSIS — E1122 Type 2 diabetes mellitus with diabetic chronic kidney disease: Secondary | ICD-10-CM | POA: Diagnosis not present

## 2024-05-08 DIAGNOSIS — Z947 Corneal transplant status: Secondary | ICD-10-CM | POA: Diagnosis not present

## 2024-05-08 DIAGNOSIS — T86891 Other transplanted tissue failure: Secondary | ICD-10-CM | POA: Diagnosis not present

## 2024-06-14 ENCOUNTER — Other Ambulatory Visit: Payer: Self-pay | Admitting: Cardiology

## 2024-06-14 DIAGNOSIS — I4892 Unspecified atrial flutter: Secondary | ICD-10-CM

## 2024-06-14 NOTE — Telephone Encounter (Signed)
 Eliquis  5mg  refill request received. Patient is 89 years old, weight-83.5kg, Crea-1.14 on 10/28/23, Diagnosis-Afib, and last seen by Dr. Debera on 04/05/24. Dose is appropriate based on dosing criteria. Will send in refill to requested pharmacy.
